# Patient Record
Sex: Male | Born: 1950
Health system: Southern US, Community
[De-identification: ages and names within clinical notes are randomized; demographics above are authoritative.]

## PROBLEM LIST (undated history)

## (undated) DIAGNOSIS — IMO0002 Reserved for concepts with insufficient information to code with codable children: Secondary | ICD-10-CM

## (undated) DIAGNOSIS — D126 Benign neoplasm of colon, unspecified: Secondary | ICD-10-CM

## (undated) DIAGNOSIS — T7840XA Allergy, unspecified, initial encounter: Secondary | ICD-10-CM

## (undated) DIAGNOSIS — R51 Headache: Secondary | ICD-10-CM

## (undated) DIAGNOSIS — K449 Diaphragmatic hernia without obstruction or gangrene: Secondary | ICD-10-CM

## (undated) DIAGNOSIS — Z8546 Personal history of malignant neoplasm of prostate: Secondary | ICD-10-CM

## (undated) DIAGNOSIS — U071 COVID-19: Secondary | ICD-10-CM

## (undated) DIAGNOSIS — I10 Essential (primary) hypertension: Secondary | ICD-10-CM

## (undated) DIAGNOSIS — J45909 Unspecified asthma, uncomplicated: Secondary | ICD-10-CM

## (undated) DIAGNOSIS — J189 Pneumonia, unspecified organism: Secondary | ICD-10-CM

## (undated) DIAGNOSIS — C801 Malignant (primary) neoplasm, unspecified: Secondary | ICD-10-CM

## (undated) DIAGNOSIS — R740 Nonspecific elevation of levels of transaminase and lactic acid dehydrogenase [LDH]: Secondary | ICD-10-CM

## (undated) DIAGNOSIS — K635 Polyp of colon: Secondary | ICD-10-CM

## (undated) DIAGNOSIS — E785 Hyperlipidemia, unspecified: Secondary | ICD-10-CM

## (undated) DIAGNOSIS — Z77090 Contact with and (suspected) exposure to asbestos: Secondary | ICD-10-CM

## (undated) DIAGNOSIS — K219 Gastro-esophageal reflux disease without esophagitis: Secondary | ICD-10-CM

## (undated) DIAGNOSIS — F411 Generalized anxiety disorder: Secondary | ICD-10-CM

## (undated) DIAGNOSIS — K3184 Gastroparesis: Secondary | ICD-10-CM

## (undated) HISTORY — DX: Personal history of malignant neoplasm of prostate: Z85.46

## (undated) HISTORY — DX: Diaphragmatic hernia without obstruction or gangrene: K44.9

## (undated) HISTORY — DX: Benign neoplasm of colon, unspecified: D12.6

## (undated) HISTORY — DX: Unspecified asthma, uncomplicated: J45.909

## (undated) HISTORY — DX: Contact with and (suspected) exposure to asbestos: Z77.090

## (undated) HISTORY — DX: Polyp of colon: K63.5

## (undated) HISTORY — PX: ROTATOR CUFF REPAIR: SHX139

## (undated) HISTORY — DX: COVID-19: U07.1

## (undated) HISTORY — DX: Allergy, unspecified, initial encounter: T78.40XA

## (undated) HISTORY — DX: Nonspecific elevation of levels of transaminase and lactic acid dehydrogenase (ldh): R74.0

## (undated) HISTORY — PX: COLONOSCOPY: SHX174

## (undated) HISTORY — DX: Gastroparesis: K31.84

## (undated) HISTORY — PX: APPENDECTOMY: SHX54

## (undated) HISTORY — DX: Generalized anxiety disorder: F41.1

## (undated) HISTORY — DX: Essential (primary) hypertension: I10

## (undated) HISTORY — DX: Headache: R51

## (undated) HISTORY — DX: Gastro-esophageal reflux disease without esophagitis: K21.9

## (undated) HISTORY — DX: Reserved for concepts with insufficient information to code with codable children: IMO0002

## (undated) HISTORY — DX: Hyperlipidemia, unspecified: E78.5

---

## 2001-02-15 ENCOUNTER — Encounter: Payer: Self-pay | Admitting: Internal Medicine

## 2002-02-28 LAB — HM COLONOSCOPY: HM Colonoscopy: NORMAL

## 2002-06-29 LAB — HM SIGMOIDOSCOPY

## 2002-07-11 ENCOUNTER — Encounter: Payer: Self-pay | Admitting: Internal Medicine

## 2004-06-17 ENCOUNTER — Ambulatory Visit: Payer: Self-pay | Admitting: Internal Medicine

## 2004-12-09 ENCOUNTER — Ambulatory Visit: Payer: Self-pay | Admitting: Internal Medicine

## 2004-12-17 ENCOUNTER — Ambulatory Visit: Payer: Self-pay | Admitting: Internal Medicine

## 2005-01-13 ENCOUNTER — Ambulatory Visit: Payer: Self-pay | Admitting: Internal Medicine

## 2005-01-19 ENCOUNTER — Ambulatory Visit: Payer: Self-pay | Admitting: Internal Medicine

## 2006-01-11 ENCOUNTER — Ambulatory Visit: Payer: Self-pay | Admitting: Internal Medicine

## 2006-02-28 DIAGNOSIS — C801 Malignant (primary) neoplasm, unspecified: Secondary | ICD-10-CM

## 2006-02-28 HISTORY — DX: Malignant (primary) neoplasm, unspecified: C80.1

## 2006-03-21 ENCOUNTER — Ambulatory Visit: Payer: Self-pay | Admitting: Internal Medicine

## 2006-03-21 LAB — CONVERTED CEMR LAB
ALT: 45 units/L — ABNORMAL HIGH (ref 0–40)
AST: 27 units/L (ref 0–37)
Albumin: 3.7 g/dL (ref 3.5–5.2)
Alkaline Phosphatase: 69 units/L (ref 39–117)
BUN: 11 mg/dL (ref 6–23)
Basophils Absolute: 0 10*3/uL (ref 0.0–0.1)
Basophils Relative: 0.9 % (ref 0.0–1.0)
CO2: 34 meq/L — ABNORMAL HIGH (ref 19–32)
Calcium: 9.5 mg/dL (ref 8.4–10.5)
Chloride: 101 meq/L (ref 96–112)
Cholesterol: 205 mg/dL (ref 0–200)
Creatinine, Ser: 1 mg/dL (ref 0.4–1.5)
Direct LDL: 106.8 mg/dL
Eosinophils Relative: 1.9 % (ref 0.0–5.0)
GFR calc Af Amer: 100 mL/min
GFR calc non Af Amer: 83 mL/min
Glucose, Bld: 87 mg/dL (ref 70–99)
HCT: 46.8 % (ref 39.0–52.0)
HDL: 42.7 mg/dL (ref >39.0)
Hemoglobin: 16.5 g/dL (ref 13.0–17.0)
Lymphocytes Relative: 32.2 % (ref 12.0–46.0)
MCHC: 35.2 g/dL (ref 30.0–36.0)
MCV: 92.7 fL (ref 78.0–100.0)
Monocytes Absolute: 0.6 10*3/uL (ref 0.2–0.7)
Monocytes Relative: 14.5 % — ABNORMAL HIGH (ref 3.0–11.0)
Neutro Abs: 2.3 10*3/uL (ref 1.4–7.7)
Neutrophils Relative %: 50.5 % (ref 43.0–77.0)
PSA: 6.26 ng/mL — ABNORMAL HIGH (ref 0.10–4.00)
Platelets: 291 10*3/uL (ref 150–400)
Potassium: 4.1 meq/L (ref 3.5–5.1)
RBC: 5.05 M/uL (ref 4.22–5.81)
RDW: 12 % (ref 11.5–14.6)
Sodium: 143 meq/L (ref 135–145)
TSH: 0.79 microintl units/mL (ref 0.35–5.50)
Total Bilirubin: 1.1 mg/dL (ref 0.3–1.2)
Total CHOL/HDL Ratio: 4.8
Total Protein: 6.8 g/dL (ref 6.0–8.3)
Triglycerides: 261 mg/dL (ref 0–149)
VLDL: 52 mg/dL — ABNORMAL HIGH (ref 0–40)
WBC: 4.4 10*3/uL — ABNORMAL LOW (ref 4.5–10.5)

## 2006-03-28 ENCOUNTER — Ambulatory Visit: Payer: Self-pay | Admitting: Internal Medicine

## 2006-04-18 ENCOUNTER — Ambulatory Visit: Payer: Self-pay | Admitting: Internal Medicine

## 2006-04-18 LAB — CONVERTED CEMR LAB
PSA, Free Pct: 12 — ABNORMAL LOW (ref >25)
PSA, Free: 0.6 ng/mL
PSA: 5.19 ng/mL — ABNORMAL HIGH (ref 0.10–4.00)

## 2006-07-10 ENCOUNTER — Inpatient Hospital Stay (HOSPITAL_COMMUNITY): Admission: RE | Admit: 2006-07-10 | Discharge: 2006-07-12 | Payer: Self-pay | Admitting: Urology

## 2006-07-10 ENCOUNTER — Encounter (INDEPENDENT_AMBULATORY_CARE_PROVIDER_SITE_OTHER): Payer: Self-pay | Admitting: Specialist

## 2006-07-10 HISTORY — PX: PROSTATECTOMY: SHX69

## 2007-01-09 ENCOUNTER — Encounter: Payer: Self-pay | Admitting: Internal Medicine

## 2007-03-07 ENCOUNTER — Ambulatory Visit: Payer: Self-pay | Admitting: Internal Medicine

## 2007-03-07 DIAGNOSIS — Z8546 Personal history of malignant neoplasm of prostate: Secondary | ICD-10-CM | POA: Insufficient documentation

## 2007-03-07 DIAGNOSIS — G43909 Migraine, unspecified, not intractable, without status migrainosus: Secondary | ICD-10-CM

## 2007-03-07 DIAGNOSIS — I10 Essential (primary) hypertension: Secondary | ICD-10-CM

## 2007-03-07 DIAGNOSIS — R51 Headache: Secondary | ICD-10-CM

## 2007-03-07 HISTORY — DX: Headache: R51

## 2007-03-07 HISTORY — DX: Essential (primary) hypertension: I10

## 2007-03-07 HISTORY — DX: Personal history of malignant neoplasm of prostate: Z85.46

## 2007-03-12 DIAGNOSIS — J45909 Unspecified asthma, uncomplicated: Secondary | ICD-10-CM

## 2007-03-12 HISTORY — DX: Unspecified asthma, uncomplicated: J45.909

## 2007-03-20 ENCOUNTER — Encounter: Payer: Self-pay | Admitting: Internal Medicine

## 2007-04-05 ENCOUNTER — Telehealth: Payer: Self-pay | Admitting: Internal Medicine

## 2007-04-17 ENCOUNTER — Encounter: Payer: Self-pay | Admitting: Internal Medicine

## 2007-06-01 ENCOUNTER — Ambulatory Visit: Payer: Self-pay | Admitting: Internal Medicine

## 2007-06-01 LAB — CONVERTED CEMR LAB
Basophils Absolute: 0 10*3/uL (ref 0.0–0.1)
Bilirubin, Direct: 0.2 mg/dL (ref 0.0–0.3)
Calcium: 9.3 mg/dL (ref 8.4–10.5)
Creatinine, Ser: 0.9 mg/dL (ref 0.4–1.5)
Eosinophils Relative: 2.2 % (ref 0.0–5.0)
GFR calc Af Amer: 113 mL/min
Glucose, Urine, Semiquant: NEGATIVE
HCT: 47 % (ref 39.0–52.0)
Ketones, urine, test strip: NEGATIVE
MCHC: 33.3 g/dL (ref 30.0–36.0)
Monocytes Absolute: 0.7 10*3/uL (ref 0.1–1.0)
Monocytes Relative: 14.5 % — ABNORMAL HIGH (ref 3.0–12.0)
Neutro Abs: 2.6 10*3/uL (ref 1.4–7.7)
Nitrite: NEGATIVE
Potassium: 3.9 meq/L (ref 3.5–5.1)
RBC: 4.9 M/uL (ref 4.22–5.81)
RDW: 11.6 % (ref 11.5–14.6)
TSH: 1.05 microintl units/mL (ref 0.35–5.50)
Total Bilirubin: 1.1 mg/dL (ref 0.3–1.2)
Total CHOL/HDL Ratio: 5.4
Total Protein: 6.3 g/dL (ref 6.0–8.3)
Triglycerides: 231 mg/dL (ref 0–149)
Urobilinogen, UA: 0.2
WBC Urine, dipstick: NEGATIVE
WBC: 4.9 10*3/uL (ref 4.5–10.5)

## 2007-06-11 ENCOUNTER — Ambulatory Visit: Payer: Self-pay | Admitting: Internal Medicine

## 2007-06-11 DIAGNOSIS — E785 Hyperlipidemia, unspecified: Secondary | ICD-10-CM

## 2007-06-11 HISTORY — DX: Hyperlipidemia, unspecified: E78.5

## 2007-06-12 ENCOUNTER — Telehealth (INDEPENDENT_AMBULATORY_CARE_PROVIDER_SITE_OTHER): Payer: Self-pay | Admitting: *Deleted

## 2007-06-27 ENCOUNTER — Emergency Department (HOSPITAL_COMMUNITY): Admission: EM | Admit: 2007-06-27 | Discharge: 2007-06-27 | Payer: Self-pay | Admitting: Emergency Medicine

## 2007-07-17 ENCOUNTER — Encounter: Payer: Self-pay | Admitting: Internal Medicine

## 2007-08-08 ENCOUNTER — Encounter: Payer: Self-pay | Admitting: Internal Medicine

## 2007-09-17 ENCOUNTER — Encounter: Admission: RE | Admit: 2007-09-17 | Discharge: 2007-09-17 | Payer: Self-pay | Admitting: Orthopedic Surgery

## 2007-10-04 ENCOUNTER — Ambulatory Visit: Payer: Self-pay | Admitting: Internal Medicine

## 2007-10-04 LAB — CONVERTED CEMR LAB
Alkaline Phosphatase: 60 units/L (ref 39–117)
Bilirubin, Direct: 0.2 mg/dL (ref 0.0–0.3)
Cholesterol: 214 mg/dL (ref 0–200)
HDL: 34.9 mg/dL — ABNORMAL LOW (ref >39.0)
Total Bilirubin: 1.3 mg/dL — ABNORMAL HIGH (ref 0.3–1.2)
VLDL: 40 mg/dL (ref 0–40)

## 2007-10-15 ENCOUNTER — Ambulatory Visit: Payer: Self-pay | Admitting: Internal Medicine

## 2007-12-12 ENCOUNTER — Ambulatory Visit: Payer: Self-pay | Admitting: Internal Medicine

## 2008-01-04 ENCOUNTER — Encounter: Payer: Self-pay | Admitting: Internal Medicine

## 2008-01-08 ENCOUNTER — Ambulatory Visit: Payer: Self-pay | Admitting: Internal Medicine

## 2008-01-08 LAB — CONVERTED CEMR LAB
AST: 31 units/L (ref 0–37)
Alkaline Phosphatase: 66 units/L (ref 39–117)
Bilirubin, Direct: 0.1 mg/dL (ref 0.0–0.3)
Total Bilirubin: 0.9 mg/dL (ref 0.3–1.2)
Total CHOL/HDL Ratio: 5
VLDL: 47 mg/dL — ABNORMAL HIGH (ref 0–40)

## 2008-01-22 ENCOUNTER — Ambulatory Visit: Payer: Self-pay | Admitting: Internal Medicine

## 2008-03-10 ENCOUNTER — Ambulatory Visit: Payer: Self-pay | Admitting: Internal Medicine

## 2008-03-14 ENCOUNTER — Telehealth: Payer: Self-pay | Admitting: Internal Medicine

## 2008-03-25 ENCOUNTER — Encounter: Payer: Self-pay | Admitting: Internal Medicine

## 2008-06-04 ENCOUNTER — Ambulatory Visit: Payer: Self-pay | Admitting: Internal Medicine

## 2008-06-04 LAB — CONVERTED CEMR LAB
Albumin: 3.6 g/dL (ref 3.5–5.2)
Bilirubin, Direct: 0.1 mg/dL (ref 0.0–0.3)
Eosinophils Absolute: 0.1 10*3/uL (ref 0.0–0.7)
GFR calc non Af Amer: 111.97 mL/min (ref >60)
Glucose, Bld: 91 mg/dL (ref 70–99)
Glucose, Urine, Semiquant: NEGATIVE
HCT: 44 % (ref 39.0–52.0)
HDL: 40 mg/dL (ref >39.00)
Lymphocytes Relative: 25.9 % (ref 12.0–46.0)
Lymphs Abs: 1.3 10*3/uL (ref 0.7–4.0)
MCV: 95.1 fL (ref 78.0–100.0)
Monocytes Absolute: 0.8 10*3/uL (ref 0.1–1.0)
Neutro Abs: 3 10*3/uL (ref 1.4–7.7)
Neutrophils Relative %: 58 % (ref 43.0–77.0)
Platelets: 246 10*3/uL (ref 150.0–400.0)
Potassium: 3.1 meq/L — ABNORMAL LOW (ref 3.5–5.1)
Sodium: 141 meq/L (ref 135–145)
Specific Gravity, Urine: 1.025
Total Bilirubin: 1.4 mg/dL — ABNORMAL HIGH (ref 0.3–1.2)
Total Protein: 7 g/dL (ref 6.0–8.3)
Urobilinogen, UA: 1
WBC Urine, dipstick: NEGATIVE

## 2008-06-17 ENCOUNTER — Ambulatory Visit: Payer: Self-pay | Admitting: Internal Medicine

## 2008-06-17 LAB — HM COLONOSCOPY

## 2008-07-04 ENCOUNTER — Encounter: Payer: Self-pay | Admitting: Internal Medicine

## 2008-09-23 ENCOUNTER — Telehealth (INDEPENDENT_AMBULATORY_CARE_PROVIDER_SITE_OTHER): Payer: Self-pay | Admitting: *Deleted

## 2008-11-20 ENCOUNTER — Telehealth: Payer: Self-pay | Admitting: *Deleted

## 2008-12-16 ENCOUNTER — Ambulatory Visit: Payer: Self-pay | Admitting: Internal Medicine

## 2008-12-16 ENCOUNTER — Telehealth: Payer: Self-pay | Admitting: Internal Medicine

## 2008-12-24 LAB — CONVERTED CEMR LAB
ALT: 80 units/L — ABNORMAL HIGH (ref 0–53)
AST: 40 units/L — ABNORMAL HIGH (ref 0–37)
Alkaline Phosphatase: 84 units/L (ref 39–117)
BUN: 13 mg/dL (ref 6–23)
Bilirubin, Direct: 0.1 mg/dL (ref 0.0–0.3)
Calcium: 9.5 mg/dL (ref 8.4–10.5)
Creatinine, Ser: 0.9 mg/dL (ref 0.4–1.5)
GFR calc non Af Amer: 111.75 mL/min (ref >60)
HDL: 47 mg/dL (ref >39.00)
LDL Cholesterol: 91 mg/dL (ref 0–99)
TSH: 1.46 microintl units/mL (ref 0.35–5.50)
Total Bilirubin: 1 mg/dL (ref 0.3–1.2)
Total CHOL/HDL Ratio: 4
Total Protein: 7.2 g/dL (ref 6.0–8.3)
VLDL: 33 mg/dL (ref 0.0–40.0)

## 2009-01-02 ENCOUNTER — Encounter (INDEPENDENT_AMBULATORY_CARE_PROVIDER_SITE_OTHER): Payer: Self-pay | Admitting: *Deleted

## 2009-01-05 ENCOUNTER — Ambulatory Visit: Payer: Self-pay | Admitting: Family Medicine

## 2009-01-21 ENCOUNTER — Ambulatory Visit: Payer: Self-pay | Admitting: Internal Medicine

## 2009-02-06 ENCOUNTER — Telehealth: Payer: Self-pay | Admitting: Internal Medicine

## 2009-02-18 ENCOUNTER — Ambulatory Visit: Payer: Self-pay | Admitting: Internal Medicine

## 2009-03-24 ENCOUNTER — Ambulatory Visit: Payer: Self-pay | Admitting: Internal Medicine

## 2009-03-24 LAB — CONVERTED CEMR LAB
Alkaline Phosphatase: 80 units/L (ref 39–117)
Bilirubin, Direct: 0.1 mg/dL (ref 0.0–0.3)
Direct LDL: 60.1 mg/dL
Hep B S Ab: NEGATIVE
Total Bilirubin: 1 mg/dL (ref 0.3–1.2)

## 2009-03-31 ENCOUNTER — Ambulatory Visit: Payer: Self-pay | Admitting: Internal Medicine

## 2009-04-01 ENCOUNTER — Encounter: Payer: Self-pay | Admitting: Internal Medicine

## 2009-04-01 ENCOUNTER — Telehealth: Payer: Self-pay | Admitting: Internal Medicine

## 2009-04-02 ENCOUNTER — Encounter: Payer: Self-pay | Admitting: Internal Medicine

## 2009-04-02 ENCOUNTER — Ambulatory Visit: Payer: Self-pay | Admitting: Gastroenterology

## 2009-04-02 DIAGNOSIS — K219 Gastro-esophageal reflux disease without esophagitis: Secondary | ICD-10-CM

## 2009-04-02 HISTORY — DX: Gastro-esophageal reflux disease without esophagitis: K21.9

## 2009-04-06 ENCOUNTER — Ambulatory Visit (HOSPITAL_COMMUNITY): Admission: RE | Admit: 2009-04-06 | Discharge: 2009-04-06 | Payer: Self-pay | Admitting: Gastroenterology

## 2009-04-07 ENCOUNTER — Ambulatory Visit: Payer: Self-pay | Admitting: Internal Medicine

## 2009-04-07 LAB — CONVERTED CEMR LAB
Albumin: 3.6 g/dL (ref 3.5–5.2)
BUN: 10 mg/dL (ref 6–23)
Basophils Absolute: 0 10*3/uL (ref 0.0–0.1)
Basophils Relative: 0 % (ref 0.0–3.0)
CO2: 32 meq/L (ref 19–32)
Creatinine, Ser: 1.1 mg/dL (ref 0.4–1.5)
Eosinophils Absolute: 0 10*3/uL (ref 0.0–0.7)
Eosinophils Relative: 0.1 % (ref 0.0–5.0)
GFR calc non Af Amer: 88.56 mL/min (ref >60)
HCT: 46.6 % (ref 39.0–52.0)
Lymphocytes Relative: 4.7 % — ABNORMAL LOW (ref 12.0–46.0)
Lymphs Abs: 0.4 10*3/uL — ABNORMAL LOW (ref 0.7–4.0)
MCV: 93.9 fL (ref 78.0–100.0)
Monocytes Relative: 2.8 % — ABNORMAL LOW (ref 3.0–12.0)
Neutro Abs: 8.3 10*3/uL — ABNORMAL HIGH (ref 1.4–7.7)
WBC: 9 10*3/uL (ref 4.5–10.5)

## 2009-04-08 ENCOUNTER — Telehealth: Payer: Self-pay | Admitting: Internal Medicine

## 2009-04-08 ENCOUNTER — Telehealth (INDEPENDENT_AMBULATORY_CARE_PROVIDER_SITE_OTHER): Payer: Self-pay | Admitting: *Deleted

## 2009-04-08 ENCOUNTER — Encounter: Payer: Self-pay | Admitting: Internal Medicine

## 2009-04-10 ENCOUNTER — Ambulatory Visit: Payer: Self-pay | Admitting: Internal Medicine

## 2009-04-10 ENCOUNTER — Encounter: Payer: Self-pay | Admitting: Internal Medicine

## 2009-04-14 ENCOUNTER — Encounter: Payer: Self-pay | Admitting: Internal Medicine

## 2009-04-14 ENCOUNTER — Telehealth: Payer: Self-pay | Admitting: Internal Medicine

## 2009-04-24 ENCOUNTER — Ambulatory Visit (HOSPITAL_COMMUNITY): Admission: RE | Admit: 2009-04-24 | Discharge: 2009-04-24 | Payer: Self-pay | Admitting: Internal Medicine

## 2009-04-24 ENCOUNTER — Telehealth: Payer: Self-pay | Admitting: Internal Medicine

## 2009-04-28 DIAGNOSIS — R7401 Elevation of levels of liver transaminase levels: Secondary | ICD-10-CM

## 2009-04-28 DIAGNOSIS — K449 Diaphragmatic hernia without obstruction or gangrene: Secondary | ICD-10-CM

## 2009-04-28 DIAGNOSIS — R740 Nonspecific elevation of levels of transaminase and lactic acid dehydrogenase [LDH]: Secondary | ICD-10-CM

## 2009-04-28 HISTORY — DX: Elevation of levels of liver transaminase levels: R74.01

## 2009-04-28 HISTORY — DX: Diaphragmatic hernia without obstruction or gangrene: K44.9

## 2009-05-01 ENCOUNTER — Ambulatory Visit: Payer: Self-pay | Admitting: Internal Medicine

## 2009-05-01 DIAGNOSIS — K3184 Gastroparesis: Secondary | ICD-10-CM

## 2009-05-01 HISTORY — DX: Gastroparesis: K31.84

## 2009-07-01 ENCOUNTER — Ambulatory Visit: Payer: Self-pay | Admitting: Internal Medicine

## 2009-07-01 LAB — CONVERTED CEMR LAB
ALT: 39 units/L (ref 0–53)
AST: 28 units/L (ref 0–37)
Albumin: 3.9 g/dL (ref 3.5–5.2)
Alkaline Phosphatase: 71 units/L (ref 39–117)
BUN: 10 mg/dL (ref 6–23)
Basophils Absolute: 0 10*3/uL (ref 0.0–0.1)
Blood in Urine, dipstick: NEGATIVE
CO2: 33 meq/L — ABNORMAL HIGH (ref 19–32)
Creatinine, Ser: 0.9 mg/dL (ref 0.4–1.5)
Eosinophils Absolute: 0.1 10*3/uL (ref 0.0–0.7)
Glucose, Bld: 83 mg/dL (ref 70–99)
HCT: 45.8 % (ref 39.0–52.0)
HDL: 39.3 mg/dL (ref >39.00)
Lymphocytes Relative: 34.8 % (ref 12.0–46.0)
MCHC: 34.4 g/dL (ref 30.0–36.0)
MCV: 94 fL (ref 78.0–100.0)
Monocytes Relative: 15.4 % — ABNORMAL HIGH (ref 3.0–12.0)
Neutrophils Relative %: 47 % (ref 43.0–77.0)
Nitrite: NEGATIVE
Platelets: 280 10*3/uL (ref 150.0–400.0)
RDW: 13.1 % (ref 11.5–14.6)
Sodium: 145 meq/L (ref 135–145)
Specific Gravity, Urine: 1.02
TSH: 0.78 microintl units/mL (ref 0.35–5.50)
Total Bilirubin: 0.7 mg/dL (ref 0.3–1.2)
Total CHOL/HDL Ratio: 4
Urobilinogen, UA: 0.2
WBC: 4.2 10*3/uL — ABNORMAL LOW (ref 4.5–10.5)
pH: 6

## 2009-07-02 ENCOUNTER — Encounter: Payer: Self-pay | Admitting: Internal Medicine

## 2009-07-07 ENCOUNTER — Ambulatory Visit: Payer: Self-pay | Admitting: Internal Medicine

## 2009-07-08 ENCOUNTER — Encounter: Admission: RE | Admit: 2009-07-08 | Discharge: 2009-08-10 | Payer: Self-pay | Admitting: Internal Medicine

## 2009-07-08 ENCOUNTER — Encounter: Payer: Self-pay | Admitting: Internal Medicine

## 2009-07-30 ENCOUNTER — Ambulatory Visit: Payer: Self-pay | Admitting: Internal Medicine

## 2009-08-03 ENCOUNTER — Telehealth: Payer: Self-pay | Admitting: Internal Medicine

## 2009-08-18 ENCOUNTER — Ambulatory Visit (HOSPITAL_COMMUNITY): Admission: RE | Admit: 2009-08-18 | Discharge: 2009-08-18 | Payer: Self-pay | Admitting: Internal Medicine

## 2009-09-01 ENCOUNTER — Ambulatory Visit: Payer: Self-pay | Admitting: Internal Medicine

## 2009-09-01 DIAGNOSIS — F411 Generalized anxiety disorder: Secondary | ICD-10-CM

## 2009-09-01 HISTORY — DX: Generalized anxiety disorder: F41.1

## 2009-09-03 ENCOUNTER — Telehealth: Payer: Self-pay | Admitting: Internal Medicine

## 2009-10-07 ENCOUNTER — Ambulatory Visit: Payer: Self-pay | Admitting: Internal Medicine

## 2009-11-30 ENCOUNTER — Ambulatory Visit: Payer: Self-pay | Admitting: Internal Medicine

## 2009-12-07 ENCOUNTER — Telehealth: Payer: Self-pay | Admitting: Internal Medicine

## 2009-12-24 ENCOUNTER — Telehealth: Payer: Self-pay | Admitting: Internal Medicine

## 2010-01-08 ENCOUNTER — Encounter: Payer: Self-pay | Admitting: Internal Medicine

## 2010-02-05 ENCOUNTER — Telehealth: Payer: Self-pay | Admitting: Internal Medicine

## 2010-03-30 NOTE — Assessment & Plan Note (Signed)
Summary: F/U FROM ENDO, CT & GES         Mark [name]   History of Present Illness Visit Type: Follow-up Visit Primary GI MD: Lina Sar MD Primary Provider: Birdie Sons, MD Requesting Provider: n/a Chief Complaint: F/u from ENDO, CT, and GES..Pt states that he is doing better but still having a funny feeling in his stomach  History of Present Illness:   This is a 60 year old African American male with gastroesophageal reflux and newly diagnosed gastroparesis. His gastric emptying scan showed 75% retention at one hour and 36% retention at 2 hours. He had mild gastritis and distal esophagitis with a small hiatal hernia. He feels better on Reglan 5 mg 3 times a day. A CT scan of the abdomen and pelvis in February 2011 showed him to be status post prostatectomy but was an otherwise normal exam. His last upper endoscopy was on 04/07/09.   GI Review of Systems      Denies abdominal pain, acid reflux, belching, bloating, chest pain, dysphagia with liquids, dysphagia with solids, heartburn, loss of appetite, nausea, vomiting, vomiting blood, weight loss, and  weight gain.        Denies anal fissure, black tarry stools, change in bowel habit, constipation, diarrhea, diverticulosis, fecal incontinence, heme positive stool, hemorrhoids, irritable bowel syndrome, jaundice, light color stool, liver problems, rectal bleeding, and  rectal pain.    Current Medications (verified): 1)  Fexofenadine Hcl 180 Mg Tabs (Fexofenadine Hcl) .... Once Daily 2)  Fluticasone Propionate 50 Mcg/act  Susp (Fluticasone Propionate) .... One Puff As Directed Once Daily 3)  Propranolol-Hctz 40-25 Mg Tabs (Propranolol-Hctz) .... Take 1 Tablet By Mouth Twice A Day 4)  Detrol La 4 Mg  Xr24h-Cap (Tolterodine Tartrate) .... Take 1 Tablet By Mouth Once A Day 5)  Simvastatin 40 Mg Tabs (Simvastatin) .... Take One Tablet At Bedtime 6)  Proair Hfa 108 (90 Base) Mcg/act Aers (Albuterol Sulfate) .... 2 Inhalations Qid As Directed 7)   Butalbital-Apap-Caffeine 50-325-40 Mg Caps (Butalbital-Apap-Caffeine) .... Take 1 Tablet By Mouth Once A Day As Needed 8)  Azelastine Hcl 137 Mcg/spray Soln (Azelastine Hcl) .... 2 Puffs Each Nostril Daily 9)  Nexium 40 Mg Cpdr (Esomeprazole Magnesium) .... Take 1 Tablet By Mouth Once A Day 10)  Ondansetron Hcl 4 Mg Tabs (Ondansetron Hcl) .[name] Kitchen.. 1 By Mouth Every 6 Hours As Needed For Nausea. 11)  Vicodin 5-500 Mg Tabs (Hydrocodone-Acetaminophen) .... Take One (1) Tablet By Mouth Every 6 Hrs As Needed For Pain 12)  Reglan 5 Mg Tabs (Metoclopramide Hcl) .... Take One By Mouth 30 Minutes Before Breakfast, Lunch & Dinner.  Allergies (verified): 1)  Darvon (Propoxyphene Hcl)  Past History:  Past Medical History: Reviewed history from 10/15/2007 and no changes required. Prostate cancer, hx of Headache Hypertension elevated LFT's--Hep B & C neg elevated PSA Asthma, as child Hyperlipidemia Allergic rhinitis  Past Surgical History: Reviewed history from 04/02/2009 and no changes required. Prostatectomy 5/12/8 Appendectomy ROTATOR CUFF REPAIR  9/09  Family History: Family History Hypertension-father, 2 brothers mother died age 60 pancreatic cancer No FH of Colon Cancer:  Social History: Reviewed history from 04/02/2009 and no changes required. Married 4 healthy children Occupation: [employer] Alcohol Use - no Illicit Drug Use - no  Review of Systems  The patient denies allergy/sinus, anemia, anxiety-new, arthritis/joint pain, back pain, blood in urine, breast changes/lumps, change in vision, confusion, cough, coughing up blood, depression-new, fainting, fatigue, fever, headaches-new, hearing problems, heart murmur, heart rhythm changes, itching, muscle pains/cramps, night  sweats, nosebleeds, shortness of breath, skin rash, sleeping problems, sore throat, swelling of feet/legs, swollen lymph glands, thirst - excessive, urination - excessive, urination changes/pain, urine leakage,  vision changes, and voice change.         Pertinent positive and negative review of systems were noted in the above HPI. All other ROS was otherwise negative.   Vital Signs:  Patient profile:   60 year old male Height:      69.5 inches Weight:      196 pounds BMI:     28.63 BSA:     2.06 Pulse rate:   72 / minute Pulse rhythm:   regular BP sitting:   128 / 86  (left arm) Cuff size:   regular  Vitals Entered By: Ok Anis CMA (May 01, 2009 8:59 AM)   Impression & Recommendations:  Problem # 1:  GASTROPARESIS (ICD-536.3) Patinet has delayed gastric emptying.  He is on Reglan 5 mg 3 times a day with only partial response. Patient denies any side effects of Reglan. We will increase his Reglan to 10 mg 2 times a day and have him to continue Nexium 40 mg twice a day. We have filled out his FML form.  Problem # 2:  WEIGHT LOSS, ABNORMAL (ICD-783.21) Patient is 196 pounds today which is the usual weight.  Problem # 3:  NAUSEA AND VOMITING (ICD-787.01) improved on Reglan.  Patient Instructions: 1)  Reglan 10 mg p.o. t.i.d. before meals. 2)  Nexium 40 mg p.o. b.i.d. 3)  Refill on Zofran. 4)  Gastroparesis diet. 5)  Booklet on gastroparesis. 6)  FML form filled out. 7)  Office visit 3 months. 8)  Copy sent to : Dr B.Swords 9)  The medication list was reviewed and reconciled.  All changed / newly prescribed medications were explained.  A complete medication list was provided to the patient / caregiver. Prescriptions: REGLAN 5 MG TABS (METOCLOPRAMIDE HCL) Take 2 tablets by mouth three times a day (before breakfast, lunch and dinner)  #270 x 3   Entered by:   Hortense Ramal CMA (AAMA)   Authorized by:   Hart Carwin MD   Signed by:   Hortense Ramal CMA (AAMA) on 05/01/2009   Method used:   Electronically to        [facility]* (mail-order)       [address] [place], [place]  [address]       Ph: [phone]       Fax: [phone]   RxID:    [id number] ONDANSETRON HCL 4 MG TABS (ONDANSETRON HCL) 1 by mouth every 6 hours as needed for nausea.  #20 x 1   Entered by:   Hortense Ramal CMA (AAMA)   Authorized by:   Hart Carwin MD   Signed by:   Hortense Ramal CMA (AAMA) on 05/01/2009   Method used:   [facility] to        [facility] [phone]* (retail)       [address]       [place], [place]  [address]       Ph: [phone] or [phone]       Fax: [phone]   RxID:   [id number] NEXIUM 40 MG CPDR (ESOMEPRAZOLE MAGNESIUM) Take 1 tablet by mouth two times a day  #180 x 3   Entered by:   Hortense Ramal CMA (AAMA)  Authorized by:   Hart Carwin MD   Signed by:   Hortense Ramal CMA (AAMA) on 05/01/2009   Method used:   [facility] to        [facility] [phone]* (retail)       [address]       [place], [place]  [address]       Ph: [phone] or [phone]       Fax: [phone]   RxID:   [id number]

## 2010-03-30 NOTE — Letter (Signed)
Summary: [facility]  [facility]   Imported By: Maryln Gottron 07/08/2009 13:19:07  _____________________________________________________________________  External Attachment:    Type:   Image     Comment:   External Document

## 2010-03-30 NOTE — Progress Notes (Signed)
Summary: CONDITION UPDATE  Phone Note Call from Patient Call back at Home Phone [phone] Call back at [phone]   Caller: Patient Call For: Dr. Juanda Chance Reason for Call: Talk to Nurse Summary of Call: pt says he talked to Dr. Juanda Chance last night and she told him to call today if he wasnt feeling any better... pt reports that he has diarrhea this morning and nausea Initial call taken by: Vallarie Mare,  April 08, 2009 10:15 AM  Follow-up for Phone Call        Pt. is scheduled for a CT on 04-10-09, he chose this date.  Pt. continues with nausea and loose stools. Denies blood, fever.  1) Immodium as needed for diarrhea. 2) Full liquids/BRAT diet x24 hours. Advance to soft,bland diet x2-4 days. Advanced as tolerated. 3) Use Zofran or Phenergan for nausea, whichever one works best for you. 4) If symptoms become worse call back immediately or go to ER.   Follow-up by: Laureen Ochs LPN,  April 08, 2009 10:25 AM  Additional Follow-up for Phone Call Additional follow up Details #1::        reviwed and agree. Additional Follow-up by: Hart Carwin MD,  April 08, 2009 12:52 PM

## 2010-03-30 NOTE — Letter (Signed)
Summary: EGD Instructions  [facility]  [address] [place], [place] [address]   Phone: [phone]  Fax: [phone]       RONNIE DOO    Aug 20, 1950    MRN: [id number]       Procedure Day /Date: 04-07-09     Arrival Time: 10:30 AM     Procedure Time: 11:30 AM     Location of Procedure:                    X    [facility] (4th Floor)  PREPARATION FOR ENDOSCOPY   On 04-07-09 THE DAY OF THE PROCEDURE:  1.   No solid foods, milk or milk products are allowed after midnight the night before your procedure.  2.   Do not drink anything colored red or purple.  Avoid juices with pulp.  No orange juice.  3.  You may drink clear liquids until  9:30 AM  which is 2 hours before your procedure.                                                                                                CLEAR LIQUIDS INCLUDE: Water Jello Ice Popsicles Tea (sugar ok, no milk/cream) Powdered fruit flavored drinks Coffee (sugar ok, no milk/cream) Gatorade Juice: apple, white grape, white cranberry  Lemonade Clear bullion, consomm, broth Carbonated beverages (any kind) Strained chicken noodle soup Hard Candy   MEDICATION INSTRUCTIONS  Unless otherwise instructed, you should take regular prescription medications with a small sip of water as early as possible the morning of your procedure.      OTHER INSTRUCTIONS  You will need a responsible adult at least 59 years of age to accompany you and drive you home.   This person must remain in the waiting room during your procedure.  Wear loose fitting clothing that is easily removed.  Leave jewelry and other valuables at home.  However, you may wish to bring a book to read or an iPod/MP3 player to listen to music as you wait for your procedure to start.  Remove all body piercing jewelry and leave at home.  Total time from sign-in until discharge is approximately 2-3 hours.  You should go home directly after your  procedure and rest.  You can resume normal activities the day after your procedure.  The day of your procedure you should not:   Drive   Make legal decisions   Operate machinery   Drink alcohol   Return to work  You will receive specific instructions about eating, activities and medications before you leave.    The above instructions have been reviewed and explained to me by   _______________________    I fully understand and can verbalize these instructions _____________________________ Date _________

## 2010-03-30 NOTE — Miscellaneous (Signed)
Summary: vicodin rx  Clinical Lists Changes  Medications: Added new medication of VICODIN 5-500 MG TABS (HYDROCODONE-ACETAMINOPHEN) Take one (1) tablet by mouth every 6 hrs as needed for pain - Signed Rx of VICODIN 5-500 MG TABS (HYDROCODONE-ACETAMINOPHEN) Take one (1) tablet by mouth every 6 hrs as needed for pain;  #15 x 0;  Signed;  Entered by: Oda Cogan;  Authorized by: Hart Carwin MD;  Method used: Print then Give to Patient    Prescriptions: VICODIN 5-500 MG TABS (HYDROCODONE-ACETAMINOPHEN) Take one (1) tablet by mouth every 6 hrs as needed for pain  #15 x 0   Entered by:   Oda Cogan   Authorized by:   Hart Carwin MD   Signed by:   Oda Cogan on 04/07/2009   Method used:   Print then Give to Patient   RxID:   [id number]

## 2010-03-30 NOTE — Progress Notes (Signed)
Summary: rx cocnern  Phone Note Call from Patient Call back at [phone]   Caller: Patient Call For: Dr. Juanda Chance Reason for Call: Talk to Nurse Summary of Call: pt says he needs a 90 days supply, not a 30 day supply... [facility]il[facility] [place] Initial call taken by: Vallarie Mare,  December 24, 2009 2:26 PM  Follow-up for Phone Call        Prescription sent. Follow-up by: Lamona Curl CMA Duncan Dull),  December 24, 2009 3:32 PM    Prescriptions: PAXIL 20 MG TABS (PAROXETINE HCL) Take 1 tablet by mouth once a day  #90 x 0   Entered by:   Lamona Curl CMA (AAMA)   Authorized by:   Hart Carwin MD   Signed by:   Lamona Curl CMA (AAMA) on 12/24/2009   Method used:   [facility] to        [facility] [phone]* (retail)       [address]       [place], [place]  [address]       Ph: [phone] or [phone]       Fax: [phone]   RxID:   [id number]

## 2010-03-30 NOTE — Assessment & Plan Note (Signed)
Summary: F/U FROM LAST OV, BEGINNING DOMPERIDONE AND HIDA SCAN        ...   History of Present Illness Visit Type: Follow-up Visit Primary GI MD: Lina Sar MD Primary Provider: Birdie Sons, MD Requesting Provider: n/a Chief Complaint: Patient still has some nausea, started domperidone, HIDA results History of Present Illness:   This is a 60 year old African American male with gastroesophageal reflux and newly diagnosed gastroparesis. His gastric emptying scan showed 75% retention at one hour and 36% retention at 2 hours. He had mild gastritis and distal esophagitis with a small hiatal hernia on endoscopy in February 2011. A CT scan of the abdomen and pelvis in February 2011 showed him to be status post prostatectomy, but was otherwise a normal exam. He takes Zofran on a daily basis. Patient denies any abdominal pain. An abdominal ultrasound in February was negative. At patient's last office visit, he was started on Paxil 10 mg daily and was scheduled for a HIDA scan. HIDA scan on 08/18/09 which showed the gallbladder contracting at 65.6%. 30% or greater is normal range. During CCK infusion the patient did report experiencing abdominal pain. There was demonstration of patency of the common bile duct and the cystic duct. There was no evidence of cholecystitis. Patient was asked to continue his Paxil and Nexium 40 mg two times a day. He comes today for a follow up after his HIDA scan. He complains of some continued nausea at this time. He has been started on domperidone 10 mg, 1 tablet by mouth three times a day before meals.he actually every just 2 tablets daily. He has felt much improved on Paxil and domperidone   GI Review of Systems    Reports nausea.      Denies abdominal pain, acid reflux, belching, bloating, chest pain, dysphagia with liquids, dysphagia with solids, heartburn, loss of appetite, vomiting, vomiting blood, weight loss, and  weight gain.        Denies anal fissure, black tarry  stools, change in bowel habit, constipation, diarrhea, diverticulosis, fecal incontinence, heme positive stool, hemorrhoids, irritable bowel syndrome, jaundice, light color stool, liver problems, rectal bleeding, and  rectal pain.    Current Medications (verified): 1)  Fexofenadine Hcl 180 Mg Tabs (Fexofenadine Hcl) .... Once Daily 2)  Fluticasone Propionate 50 Mcg/act  Susp (Fluticasone Propionate) .... One Puff As Directed Once Daily 3)  Propranolol-Hctz 40-25 Mg Tabs (Propranolol-Hctz) .... Take 1 Tablet By Mouth Twice A Day 4)  Detrol La 4 Mg  Xr24h-Cap (Tolterodine Tartrate) .... Take 1 Tablet By Mouth Once A Day 5)  Simvastatin 40 Mg Tabs (Simvastatin) .... Take One Tablet At Bedtime 6)  Proair Hfa 108 (90 Base) Mcg/act Aers (Albuterol Sulfate) .... 2 Inhalations Qid As Directed 7)  Butalbital-Apap-Caffeine 50-325-40 Mg Caps (Butalbital-Apap-Caffeine) .... Take 1 Tablet By Mouth Once A Day As Needed 8)  Azelastine Hcl 137 Mcg/spray Soln (Azelastine Hcl) .... 2 Puffs Each Nostril Daily 9)  Nexium 40 Mg Cpdr (Esomeprazole Magnesium) .... Take 1 Tablet By Mouth Two Times A Day 10)  Ondansetron Hcl 4 Mg Tabs (Ondansetron Hcl) .... Take One Tablet By Mouth Every Other Day As Needed 11)  Promethazine Hcl 25 Mg Tabs (Promethazine Hcl) .... Take One Tablet By Mouth Every Other Day As Needed Nausea 12)  Paxil 10 Mg Tabs (Paroxetine Hcl) .... Take 1 Tab By Mouth At Bedtime 13)  Domperidone 10mg  Tabs .... Take One By Mouth 30 Minutes Before Breakfast, Lunch and Dinner.  Allergies (verified): 1)  Darvon  Past History:  Past Medical History: Reviewed history from 10/15/2007 and no changes required. Prostate cancer, hx of Headache Hypertension elevated LFT's--Hep B & C neg elevated PSA Asthma, as child Hyperlipidemia Allergic rhinitis  Past Surgical History: Reviewed history from 04/02/2009 and no changes required. Prostatectomy 5/12/8 Appendectomy ROTATOR CUFF REPAIR  9/09  Family  History: Reviewed history from 05/01/2009 and no changes required. Family History Hypertension-father, 2 brothers mother died age 7 pancreatic cancer No FH of Colon Cancer:  Social History: Reviewed history from 04/02/2009 and no changes required. Married 4 healthy children Occupation: [employer] Alcohol Use - no Illicit Drug Use - no  Review of Systems       The patient complains of allergy/sinus and fatigue.  The patient denies anemia, anxiety-new, arthritis/joint pain, back pain, blood in urine, breast changes/lumps, change in vision, confusion, cough, coughing up blood, depression-new, fainting, fever, headaches-new, hearing problems, heart murmur, heart rhythm changes, itching, menstrual pain, muscle pains/cramps, night sweats, nosebleeds, pregnancy symptoms, shortness of breath, skin rash, sleeping problems, sore throat, swelling of feet/legs, swollen lymph glands, thirst - excessive , urination - excessive , urination changes/pain, urine leakage, vision changes, and voice change.         Pertinent positive and negative review of systems were noted in the above HPI. All other ROS was otherwise negative.   Vital Signs:  Patient profile:   60 year old male Height:      69.5 inches Weight:      199.50 pounds BMI:     29.14 Pulse rate:   68 / minute Pulse rhythm:   regular BP sitting:   140 / 84  (left arm) Cuff size:   regular  Vitals Entered By: June McMurray CMA Duncan Dull) (September 01, 2009 8:41 AM)   Impression & Recommendations:  Problem # 1:  GASTROPARESIS (ICD-536.3) slow gastric emptying as per recent  gastric emptying scan. Patient is somewhat improved on domperidone 10 mg prior to meals. He will continue on this  same regimen. He also has Zofran for  nausea. He has been able to reduce the Zofran to one every 2 days  Problem # 2:  HIATAL HERNIA (ICD-553.3) reflux treated with Nexium 40 mg twice a day  Problem # 3:  ANXIETY STATE, UNSPECIFIED (ICD-300.00) Patient is   very anxious. We will increase Paxil to 20 mg daily  Patient Instructions: 1)  Please increase your Paxil to 20 mg daily. We will send a new prescription to your pharmacy electronically. 2)  Continue your Zofran as directed. 3)  Continue taking your Domperidone. 4)  Copy sent to : Birdie Sons, MD 5)  The medication list was reviewed and reconciled.  All changed / newly prescribed medications were explained.  A complete medication list was provided to the patient / caregiver. Prescriptions: PAXIL 20 MG TABS (PAROXETINE HCL) Take 1 tablet by mouth once a day (PHARMACY-PLEASE D/C RX FOR PAXIL 10 MG!)  #30 x 3   Entered by:   Lamona Curl CMA (AAMA)   Authorized by:   Hart Carwin MD   Signed by:   Lamona Curl CMA (AAMA) on 09/01/2009   Method used:   [facility] to        [facility]. [phone]* (retail)       [address]       [place], [place]  [address]       Ph: [phone] or [phone]  Fax: [phone]   RxID:   [id number]

## 2010-03-30 NOTE — Progress Notes (Signed)
Summary: Triage  Phone Note Outgoing Call   Call placed by: Laureen Ochs LPN,  April 24, 2009 4:21 PM Summary of Call: Pt. states he thinks he took Reglan 30 years ago, doesn't think it worked. He will trial Reglan 5mg  AC and call back for any problems/questions. I will call pt., if new orders, after MD reviews.  Initial call taken by: Laureen Ochs LPN,  April 24, 2009 4:22 PM  Follow-up for Phone Call        He should try it. Follow-up by: Hart Carwin MD,  April 25, 2009 2:46 PM

## 2010-03-30 NOTE — Letter (Signed)
Summary: Patient Notice-Endo Biopsy Results  [facility]  [address] [place], [place] [address]   Phone: [phone]  Fax: [phone]        April 08, 2009 MRN: [id number]    Mark [name] [address] [place], [place]  [address]    Dear [name],  I am pleased to inform you that the biopsies taken during your recent endoscopic examination did not show any evidence of cancer upon pathologic examination.The tissue from the stomach showed gastritis,  Additional information/recommendations:  __No further action is needed at this time.  Please follow-up with      your primary care physician for your other healthcare needs.  _x_ Please call [phone] to schedule a return visit to review      your condition.  _x_ Continue with the treatment plan as outlined on the day of your      exam.     Please call us if you are having persistent problems or have questions about your condition that have not been fully answered at this time.  Sincerely,  Hart Carwin MD  This letter has been electronically signed by your physician.  Appended Document: Patient Notice-Endo Biopsy Results Letter mailed 2.10.11

## 2010-03-30 NOTE — Miscellaneous (Signed)
Summary: med change of directions  Clinical Lists Changes  Medications: Changed medication from ONDANSETRON HCL 4 MG TABS (ONDANSETRON HCL) 1 by mouth once daily as needed anxiety to ONDANSETRON HCL 4 MG TABS (ONDANSETRON HCL) 1 by mouth once daily as needed nausea

## 2010-03-30 NOTE — Assessment & Plan Note (Signed)
Summary: CHRONIC,WORSENING NAUSEA           (DR.BRODIE PT)        DEBORAH   History of Present Illness Visit Type: Initial Visit Primary GI MD: Lina Sar MD Primary Provider: Birdie Sons, MD Chief Complaint: Chronic nausea x 3months History of Present Illness:   60 YO MALE KNOWN TO DR. Juanda Chance FROM COLONOSCOPY DONE IN 2004 WHICH WAS NORMAL. HE IS REFERRED TODAY WITH C/O PERSISTENT NAUSEA X 4 MONTHS. HE REPORTS HX OF INTERMITTENT NAUSEA OVER THE PAST FEW YEARS, GENERALLY ABORTED WITH PHENERGAN AS NEEDED. NOW WITH ONSET OF SXS IN OCTOBER. HE ALSO WAS HAVING SOME BURNING IN HIS THROAT-WAS STARTED ON NEXIUM PER PRIMARY WHICH RESOLVED THE BURNING. HE SAYS HE FEELS NAUSEATED EVERY DAY-LIKE HE MAY VOMIT. APPETITE HAD BEEN DECREASED, LOST ABOUT 15 POUNDS, THEN GOT SCARED AND STARTED MAKING HIMSELF EAT AND HAS GAINED SOME BACK. DENIES DYSPHAGAI, ODYNOPHAGIA, NO ABDOMINAL PAIN. BMS NORMAL, NO MELENA. HE C/O EARLY SATIETY. HE ALSO TESTED + FOR H.PYLORI AND WAS TREATED LAST MONTH-NO IMPROVEMENT AND REGIMEN MADE HIM VERY NAUSEATED.   GI Review of Systems    Reports loss of appetite and  nausea.      Denies abdominal pain, acid reflux, belching, bloating, chest pain, dysphagia with liquids, dysphagia with solids, heartburn, vomiting, vomiting blood, weight loss, and  weight gain.        Denies anal fissure, black tarry stools, change in bowel habit, constipation, diarrhea, diverticulosis, fecal incontinence, heme positive stool, hemorrhoids, irritable bowel syndrome, jaundice, light color stool, liver problems, rectal bleeding, and  rectal pain. Preventive Screening-Counseling & Management      Drug Use:  no.     Current Medications (verified): 1)  Fexofenadine Hcl 180 Mg Tabs (Fexofenadine Hcl) .... Once Daily 2)  Fluticasone Propionate 50 Mcg/act  Susp (Fluticasone Propionate) .... One Puff As Directed Once Daily 3)  Propranolol-Hctz 40-25 Mg Tabs (Propranolol-Hctz) .... Take 1 Tablet By Mouth Twice  A Day 4)  Detrol La 4 Mg  Xr24h-Cap (Tolterodine Tartrate) .... Take 1 Tablet By Mouth Once A Day 5)  Simvastatin 40 Mg Tabs (Simvastatin) .... Take One Tablet At Bedtime 6)  Proair Hfa 108 (90 Base) Mcg/act Aers (Albuterol Sulfate) .... 2 Inhalations Qid As Directed 7)  Butalbital-Apap-Caffeine 50-325-40 Mg Caps (Butalbital-Apap-Caffeine) .... Take 1 Tablet By Mouth Once A Day As Needed 8)  Azelastine Hcl 137 Mcg/spray Soln (Azelastine Hcl) .... 2 Puffs Each Nostril Daily 9)  Nexium 40 Mg Cpdr (Esomeprazole Magnesium) .... Take 1 Tablet By Mouth Once A Day 10)  Ondansetron Hcl 4 Mg Tabs (Ondansetron Hcl) .[name] Kitchen.. 1 By Mouth Once Daily As Needed Nausea  Allergies (verified): 1)  Darvon (Propoxyphene Hcl)  Past History:  Past Medical History: Reviewed history from 10/15/2007 and no changes required. Prostate cancer, hx of Headache Hypertension elevated LFT's--Hep B & C neg elevated PSA Asthma, as child Hyperlipidemia Allergic rhinitis  Past Surgical History: Prostatectomy 5/12/8 Appendectomy ROTATOR CUFF REPAIR  9/09  Family History: Reviewed history from 03/12/2007 and no changes required. Family History Hypertension-father, 2 brothers mother died age 64 pancreatic cancer  Social History: Married 4 healthy children Occupation: [employer] Alcohol Use - no Illicit Drug Use - no Drug Use:  no  Review of Systems       The patient complains of allergy/sinus, back pain, cough, headaches-new, and thirst - excessive.  The patient denies anemia, anxiety-new, arthritis/joint pain, blood in urine, breast changes/lumps, change in vision, confusion, coughing up blood,  depression-new, fainting, fatigue, fever, hearing problems, heart murmur, heart rhythm changes, menstrual pain, muscle pains/cramps, night sweats, nosebleeds, pregnancy symptoms, shortness of breath, skin rash, sleeping problems, sore throat, swelling of feet/legs, swollen lymph glands, thirst - excessive , urination -  excessive , urination changes/pain, urine leakage, vision changes, and voice change.         ROS OTHERWISE AS IN HPI  Vital Signs:  Patient profile:   60 year old male Height:      69.5 inches Weight:      200.38 pounds BMI:     29.27 Pulse rate:   68 / minute Pulse rhythm:   regular BP sitting:   128 / 90  (left arm) Cuff size:   regular  Vitals Entered By: June McMurray CMA Duncan Dull) (April 02, 2009 3:13 PM)  Physical Exam  General:  Well developed, well nourished, no acute distress. Head:  Normocephalic and atraumatic. Eyes:  PERRLA, no icterus. Lungs:  Clear throughout to auscultation. Heart:  Regular rate and rhythm; no murmurs, rubs,  or bruits. Abdomen:  SOFT, NONDISTENDE, BS+ NO PALP MASS OR HSM Rectal:  NOT DONE Extremities:  No clubbing, cyanosis, edema or deformities noted. Neurologic:  Alert and  oriented x4;  grossly normal neurologically. Psych:  Alert and cooperative. Normal mood and affect.  Impression & Recommendations:  Problem # 1:  NAUSEA, CHRONIC (ICD-787.02) Assessment Unchanged 60 YO MALE WITH PERSISTENT NAUSEA X 4 MONTHS, ASSOCIATED WITH EARLY SATIETY AND HEARTBURN WHICH IS NOW CONTROLLED WITH NEXIUM. ETIOLOGY NOT CLEAR-R/O GASTRITIS/PUD, GASTROPARESIS, GB DISEASE, VS CENTRAL ETIOLOGY. LABS WITHIN PAST MONTH REVIEWED-UNREMARKABLE CONTINUE NEXIUM 40 MG DAILY CONTINUE ZOFRAN 4 MG TWICE DAILY WHICH IS BENEFICIAL SCHEDULE FOR UPPER ENDOSCOPY WITH DR. Marcille Buffy IN DETAIL WITH PT. SCHEDULE FOR UPPER ABDOMINAL US. IF BOTH OF ABOVE NEGATIVE WILL NEED GASTRIC EMPTYING SCAN. Orders: EGD (EGD)  Problem # 2:  HYPERLIPIDEMIA (ICD-272.4) Assessment: Comment Only  Problem # 3:  HYPERTENSION (ICD-401.9) Assessment: Comment Only  Problem # 4:  PROSTATE CANCER, HX OF (ICD-V10.46) Assessment: Comment Only S/P PROSTATECTOMY  Patient Instructions: 1)  We have scheduled the Ultrasound for mnon 04-06-09 at [facility].  Instructions provided. 2)   Endoscopy scheduled with Dr. Juanda Chance for 04-07-09.  3)  Endoscopy brochure provided 4)  Continue the Nexium once daily, 30 min  before breakfast. 5)  Continue the Zofran twice daily.  6)  Copy sent to : Birdie Sons, MD 7)  The medication list was reviewed and reconciled.  All changed / newly prescribed medications were explained.  A complete medication list was provided to the patient / caregiver.

## 2010-03-30 NOTE — Letter (Signed)
Summary: [facility]  [facility]   Imported By: Maryln Gottron 01/15/2010 11:29:56  _____________________________________________________________________  External Attachment:    Type:   Image     Comment:   External Document

## 2010-03-30 NOTE — Progress Notes (Signed)
Summary: CT ORDERED  Phone Note Outgoing Call   Call placed by: Laureen Ochs LPN,  April 08, 2009 9:52 AM Call placed to: Patient Summary of Call: Per Dr.Latressa Harries--Pt. needs a CT of Abd/Pelvis w/oral & IV contrast, for weight loss,n/v. Pt. is scheduled at [facility] CT on 04-10-09 at 10am. All instructions reviewed with pt. by phone. Pt. instructed to call back as needed.  Initial call taken by: Laureen Ochs LPN,  April 08, 2009 9:57 AM  New Problems: WEIGHT LOSS, ABNORMAL (ICD-783.21) NAUSEA AND VOMITING (ICD-787.01)   New Problems: WEIGHT LOSS, ABNORMAL (ICD-783.21) NAUSEA AND VOMITING (ICD-787.01)

## 2010-03-30 NOTE — Progress Notes (Signed)
Summary: Suella Broad for propranolol  Phone Note Call from Patient Call back at [phone]   Caller: vm Summary of Call: Propranolol [facility] Rx [facility] at [facility]  [place].   they have called/faxed it in a couple times.  Might have gotten sent to mail order. Out of med.  They gave 6 pills.       Initial call taken by: Rudy Jew, RN,  December 07, 2009 10:00 AM  Follow-up for Phone Call        refill for one year Follow-up by: Birdie Sons MD,  December 07, 2009 11:06 AM  Additional Follow-up for Phone Call Additional follow up Details #1::        Left message to check with [facility] and to call back to triage.  Raelene Bott Spell, RN  December 07, 2009 11:07 AM LM that Does not use [facility].  Use [phone] [facility] [place].  Call me please.   Rudy Jew, RN  December 07, 2009 12:07 PM        Additional Follow-up for Phone Call Additional follow up Details #2::    Called patient to say Rx sent to [facility] [place].   Follow-up by: Rudy Jew, RN,  December 07, 2009 12:11 PM

## 2010-03-30 NOTE — Procedures (Signed)
Summary: Upper Endoscopy  Patient: Mark [name] Note: All result statuses are Final unless otherwise noted.  Tests: (1) Upper Endoscopy (EGD)   EGD Upper Endoscopy       DONE     [facility]     [address]     [place], [place]  [address]           ENDOSCOPY PROCEDURE REPORT           PATIENT:  [name], [name]  MR#:  [id number]     BIRTHDATE:  February 03, 1951, 58 yrs. old  GENDER:  male           ENDOSCOPIST:  Hedwig Morton. Juanda Chance, MD     Referred by:  Birdie Sons, M.D.           PROCEDURE DATE:  04/07/2009     PROCEDURE:  EGD with biopsy     ASA CLASS:  Class II     INDICATIONS:  nausea nausea and vomitting this morning, fever     101.2F, no abd. pain     pt on Nexiem, Phenergan           MEDICATIONS:   Versed 6 mg, Fentanyl 50 mcg     TOPICAL ANESTHETIC:  Exactacain Spray           DESCRIPTION OF PROCEDURE:   After the risks benefits and     alternatives of the procedure were thoroughly explained, informed     consent was obtained.  The LB GIF-H180 [id number] endoscope was     introduced through the mouth and advanced to the second portion of     the duodenum, without limitations.  The instrument was slowly     withdrawn as the mucosa was fully examined.     <<PROCEDUREIMAGES>>           Mild gastritis was found. mild diffuse erythema With standard     forceps, a biopsy was obtained and sent to pathology (see image3     and image5).  Esophagitis was found in the distal esophagus (see     image1). mild distal esophagitis due to vomiting  A hiatal hernia     was found (see image7 and image6).  Otherwise the examination was     normal (see image5, image4, and image2).    Retroflexed views     revealed no abnormalities.    The scope was then withdrawn from     the patient and the procedure completed.           COMPLICATIONS:  None           ENDOSCOPIC IMPRESSION:     1) Mild gastritis     2) Esophagitis in the distal esophagus     3) Hiatal hernia     4)  Otherwise normal examination     s/p gastric biopsies     acute viral syndrome     RECOMMENDATIONS:     1) await biopsy results     Nexiem 40 mg po qd     Phenergan 25 mg po q 6 hrs prn nausea.     Tylenol prn fever     Vicodin 5/325, # 15, 1 po q 6 hrs prn, no refill           REPEAT EXAM:  In 0 year(s) for.           ______________________________     Hedwig Morton. Juanda Chance, MD  CC:           n.     eSIGNED:   Carmisha Larusso M. Nikita Surman at 04/07/2009 12:07 PM           Edythe Lynn, [id number]  Note: An exclamation mark (!) indicates a result that was not dispersed into the flowsheet. Document Creation Date: 04/07/2009 12:07 PM _______________________________________________________________________  (1) Order result status: Final Collection or observation date-time: 04/07/2009 11:56 Requested date-time:  Receipt date-time:  Reported date-time:  Referring Physician:   Ordering Physician: Lina Sar [phone]) Specimen Source:  Source: Launa Grill Order Number: [id number] Lab site:

## 2010-03-30 NOTE — Progress Notes (Signed)
Summary: TRIAGE  Phone Note Call from Patient Call back at [phone]   Caller: Patient Call For: Dr. Juanda Chance Reason for Call: Talk to Nurse Summary of Call: Discuss meds. Initial call taken by: Vallarie Mare,  August 03, 2009 9:29 AM  Follow-up for Phone Call        Pt. was seen  07-30-09 and Reglan discontinued, due to his concern over potential side effects, and he began Paxil.  Pt. states he is sleeping better on the Paxil, but is having increased nausea and bloating since stopping the Reglan.  Pt. wants to know what to do about the Gastroparesis, wonders if he needs to take Domperidone?   DR.Cashawn Yanko PLEASE ADVISE  Follow-up by: Laureen Ochs LPN,  August 03, 8117 10:31 AM  Additional Follow-up for Phone Call Additional follow up Details #1::        OK. He can try Domperidone. Available through mail from [facility] [place]. Domperidone 10 mg, # 90, 1 by mouth three times a day ac., 3 refills. Additional Follow-up by: Hart Carwin MD,  August 03, 2009 11:03 AM    Additional Follow-up for Phone Call Additional follow up Details #2::    Above MD orders reviewed with patient. Script and instructions on obtaining med. mailed to him. Pt. to keep scheduled office visit on 09-01-09. Pt. instructed to call back as needed.  Follow-up by: Laureen Ochs LPN,  August 03, 1476 11:57 AM  New/Updated Medications: * DOMPERIDONE 10MG  TABS Take one by mouth 30 minutes before breakfast, lunch and dinner. Prescriptions: DOMPERIDONE 10MG  TABS Take one by mouth 30 minutes before breakfast, lunch and dinner.  #90 x 3   Entered by:   Laureen Ochs LPN   Authorized by:   Hart Carwin MD   Signed by:   Laureen Ochs LPN on 29/56/2130   Method used:   Print then Mail to Patient   RxID:   [id number]

## 2010-03-30 NOTE — Progress Notes (Signed)
Summary: TRIAGE-Nausea  Phone Note Call from Patient Call back at CELL [phone]   Call For: DR Lu Paradise Summary of Call: Did not want to schedule next available appoinment on 3-22 with Dr Juanda Chance because he feels his nausea is getting worse every day. Had a Colon with Dr Juanda Chance back in May 2004 did not see any office visit appoinments. Takes medicine for the nausea but as soon as it wears off he gets it right back. Initial call taken by: Leanor Kail [facility],  April 01, 2009 11:21 AM  Follow-up for Phone Call        Message left for patient to callback. Laureen Ochs LPN  April 01, 2009 11:58 AM   Pt. will see Mike Gip PAC on 04-02-09 at 3pm. Advised of med.list/co-pay/cx.policy. Follow-up by: Laureen Ochs LPN,  April 01, 2009 12:39 PM

## 2010-03-30 NOTE — Assessment & Plan Note (Signed)
Summary: follow up GASTROPARESIS/lk   History of Present Illness Visit Type: Follow-up Visit Primary GI MD: Lina Sar MD Primary Provider: Birdie Sons, MD Requesting Provider: n/a Chief Complaint: f/u gastroparesis feels some better History of Present Illness:   This is a 60 year old African American male with gastroesophageal reflux and newly diagnosed gastroparesis. His gastric emptying scan showed 75% retention at one hour and 36% retention at 2 hours. He had mild gastritis and distal esophagitis with a small hiatal hernia.on endoscopy in February 2011. He feels better on Reglan 5 mg 3 times a day but he is concerned about the potential side effects. A CT scan of the abdomen and pelvis in February 2011 showed him to be status post prostatectomy  , otherwise normal exam. Patient comes today for routine follow up. He takes Zofran on a daily basis. Patient denies any abdominal pain. An abdominal ultrasound in February was negative.   GI Review of Systems    Reports nausea.      Denies abdominal pain, acid reflux, belching, bloating, chest pain, dysphagia with liquids, dysphagia with solids, heartburn, loss of appetite, vomiting, vomiting blood, weight loss, and  weight gain.      Reports change in bowel habits and  constipation.     Denies anal fissure, black tarry stools, diarrhea, diverticulosis, fecal incontinence, heme positive stool, hemorrhoids, irritable bowel syndrome, jaundice, light color stool, liver problems, rectal bleeding, and  rectal pain.    Current Medications (verified): 1)  Fexofenadine Hcl 180 Mg Tabs (Fexofenadine Hcl) .... Once Daily 2)  Fluticasone Propionate 50 Mcg/act  Susp (Fluticasone Propionate) .... One Puff As Directed Once Daily 3)  Propranolol-Hctz 40-25 Mg Tabs (Propranolol-Hctz) .... Take 1 Tablet By Mouth Twice A Day 4)  Detrol La 4 Mg  Xr24h-Cap (Tolterodine Tartrate) .... Take 1 Tablet By Mouth Once A Day 5)  Simvastatin 40 Mg Tabs (Simvastatin)  .... Take One Tablet At Bedtime 6)  Proair Hfa 108 (90 Base) Mcg/act Aers (Albuterol Sulfate) .... 2 Inhalations Qid As Directed 7)  Butalbital-Apap-Caffeine 50-325-40 Mg Caps (Butalbital-Apap-Caffeine) .... Take 1 Tablet By Mouth Once A Day As Needed 8)  Azelastine Hcl 137 Mcg/spray Soln (Azelastine Hcl) .... 2 Puffs Each Nostril Daily 9)  Nexium 40 Mg Cpdr (Esomeprazole Magnesium) .... Take 1 Tablet By Mouth Two Times A Day 10)  Ondansetron Hcl 4 Mg Tabs (Ondansetron Hcl) .... Take One Tablet By Mouth Every Other Day As Needed 11)  Reglan 5 Mg Tabs (Metoclopramide Hcl) .... Take 2 Tablets By Mouth Three Times A Day (Before Breakfast, Lunch and Dinner) 12)  Promethazine Hcl 25 Mg Tabs (Promethazine Hcl) .... Take One Tablet By Mouth Every Other Day As Needed Nausea  Allergies (verified): 1)  Darvon  Past History:  Past Medical History: Reviewed history from 10/15/2007 and no changes required. Prostate cancer, hx of Headache Hypertension elevated LFT's--Hep B & C neg elevated PSA Asthma, as child Hyperlipidemia Allergic rhinitis  Past Surgical History: Reviewed history from 04/02/2009 and no changes required. Prostatectomy 5/12/8 Appendectomy ROTATOR CUFF REPAIR  9/09  Family History: Reviewed history from 05/01/2009 and no changes required. Family History Hypertension-father, 2 brothers mother died age 50 pancreatic cancer No FH of Colon Cancer:  Social History: Reviewed history from 04/02/2009 and no changes required. Married 4 healthy children Occupation: [employer] Alcohol Use - no Illicit Drug Use - no  Review of Systems       Pertinent positive and negative review of systems were noted in the above HPI.  All other ROS was otherwise negative.   Vital Signs:  Patient profile:   60 year old male Height:      69.5 inches Weight:      195 pounds BMI:     28.49 Pulse rate:   68 / minute Pulse rhythm:   regular BP sitting:   150 / 80  (left arm)  Vitals  Entered By: Chales Abrahams CMA Duncan Dull) (July 30, 2009 8:33 AM)  Physical Exam  General:  Well developed, well nourished, no acute distress. Eyes:  PERRLA, no icterus. Mouth:  No deformity or lesions, dentition normal. Lungs:  Clear throughout to auscultation. Heart:  Regular rate and rhythm; no murmurs, rubs,  or bruits. Abdomen:  soft, nontender abdomen with normoactive bowel sounds. No mass or fullness. Liver edge at costal margin. Extremities:  No clubbing, cyanosis, edema or deformities noted.   Impression & Recommendations:  Problem # 1:  GASTROPARESIS (ICD-536.3) Patient has mild gastroparesis. I suspect he also has functional dyspepsia. We need to rule out biliary dysfunction or depression causing GI symptoms.  We will try Paxil 10 mg at bedtime. I have advised him to stop  Reglan because of potential CNS side effects . We will go ahead with a HIDA scan with [facility] to rule out a poorly functioning gallbladder. We will refill his Zofran as well.  Problem # 2:  GERD (ICD-530.81) Patient's reflux is controlled on Nexium 40 mg twice a day.  Problem # 3:  SPECIAL SCREENING FOR MALIGNANT NEOPLASMS COLON (ICD-V76.51) Patient's last colonoscopy in 2004 was normal.  Other Orders: HIDA CCK (HIDA CCK)  Patient Instructions: 1)  discontinue Reglan. 2)  Continue Nexium 40 mg twice a day. 3)  Schedule HIDA scan with [facility]. 4)  Begin Paxil 10 mg daily. 5)  Refill on Zofran. 6)  Copy sent to Dr B.Swords  7)  The medication list was reviewed and reconciled.  All changed / newly prescribed medications were explained.  A complete medication list was provided to the patient / caregiver. Prescriptions: PAXIL 10 MG TABS (PAROXETINE HCL) Take 1 tab by mouth at bedtime  #30 x 2   Entered by:   Lamona Curl CMA (AAMA)   Authorized by:   Hart Carwin MD   Signed by:   Lamona Curl CMA (AAMA) on 07/30/2009   Method used:   [facility] to        [facility] [phone]* (retail)       [address]       [place], [place]  [address]       Ph: [phone] or [phone]       Fax: [phone]   RxID:   [id number] ONDANSETRON HCL 4 MG TABS (ONDANSETRON HCL) Take one tablet by mouth every other day as needed  #30 x 1   Entered by:   Lamona Curl CMA (AAMA)   Authorized by:   Hart Carwin MD   Signed by:   Lamona Curl CMA (AAMA) on 07/30/2009   Method used:   [facility] to        [facility] [phone]* (retail)       [address]       [place], [place]  [address]       Ph: [phone] or [phone]       Fax: [phone]   RxID:   [id number]

## 2010-03-30 NOTE — Assessment & Plan Note (Signed)
Summary: F/U ON LABS // RS   Vital Signs:  Patient profile:   60 year old male Weight:      200 pounds BMI:     29.22 Temp:     98.1 degrees F Pulse rate:   78 / minute Resp:     12 per minute BP sitting:   132 / 86  (left arm)  Vitals Entered By: Gladis Riffle, RN (March 31, 2009 8:31 AM) CC: follow up labs--saw MD in [place] and put on prev-pak x 4 days  and then nexium for nausea and "acid in throat" Is Patient Diabetic? No   CC:  follow up labs--saw MD in [place] and put on prev-pak x 4 days  and then nexium for nausea and "acid in throat".  History of Present Illness:  Follow-Up Visit      This is a 60 year old man who presents for Follow-up visit.  The patient denies chest pain and palpitations.  Since the last visit the patient notes no new problems or concerns.  The patient reports taking meds as prescribed.  When questioned about possible medication side effects, the patient notes none.   All other systems reviewed and were negative   Preventive Screening-Counseling & Management  Alcohol-Tobacco     Smoking Status: quit > 6 months     Year Started: 1971     Year Quit: 1975  Current Problems (verified): 1)  Hyperlipidemia  (ICD-272.4) 2)  Physical Examination  (ICD-V70.0) 3)  Liver Function Tests, Abnormal  (ICD-794.8) 4)  Asthma  (ICD-493.90) 5)  Hypertension  (ICD-401.9) 6)  Headache  (ICD-784.0) 7)  Prostate Cancer, Hx of  (ICD-V10.46)  Current Medications (verified): 1)  Fexofenadine Hcl 180 Mg Tabs (Fexofenadine Hcl) .... Once Daily 2)  Fluticasone Propionate 50 Mcg/act  Susp (Fluticasone Propionate) .... One Puff As Directed Once Daily 3)  Propranolol-Hctz 40-25 Mg Tabs (Propranolol-Hctz) .... Take 1 Tablet By Mouth Twice A Day 4)  Detrol La 4 Mg  Xr24h-Cap (Tolterodine Tartrate) .... Take 1 Tablet By Mouth Once A Day 5)  Simvastatin 40 Mg Tabs (Simvastatin) .... Take One Tablet At Bedtime 6)  Proair Hfa 108 (90 Base) Mcg/act Aers (Albuterol  Sulfate) .... 2 Inhalations Qid As Directed 7)  Butalbital-Apap-Caffeine 50-325-40 Mg Caps (Butalbital-Apap-Caffeine) .... Take 1 Tablet By Mouth Once A Day As Needed 8)  Azelastine Hcl 137 Mcg/spray Soln (Azelastine Hcl) .... 2 Puffs Each Nostril Daily 9)  Promethazine Hcl 25 Mg  Tabs (Promethazine Hcl) .... As Needed 10)  Nexium 40 Mg Cpdr (Esomeprazole Magnesium) .... Take 1 Tablet By Mouth Once A Day  Allergies: 1)  Darvon (Propoxyphene Hcl)  Comments:  Nurse/Medical Assistant: FU labs--saw MD in [place] for acid in stonach and given prev-pak x 4 days and then nexium--continues nausea, not relieved with promethazine  The patient's medications and allergies were reviewed with the patient and were updated in the Medication and Allergy Lists. Gladis Riffle, RN (March 31, 2009 8:36 AM)  Past History:  Past Medical History: Last updated: 10/15/2007 Prostate cancer, hx of Headache Hypertension elevated LFT's--Hep B & C neg elevated PSA Asthma, as child Hyperlipidemia Allergic rhinitis  Past Surgical History: Last updated: 01/22/2008 Prostatectomy 5/12/8 Appendectomy shoulder surgery 2009  Family History: Last updated: 03/12/2007 Family History Hypertension-father, 2 brothers mother died age 62 pancreatic cancer  Social History: Last updated: 03/12/2007 Married 4 healthy children  Risk Factors: Smoking Status: quit > 6 months (03/31/2009)  Physical Exam  General:  Mark [name]  no acute distress; alert,appropriate and cooperative throughout examination Head:  normocephalic and atraumatic.   Eyes:  pupils equal and pupils round.   Neck:  supple no adenopathy Chest Wall:  No deformities, masses, tenderness or gynecomastia noted. Lungs:  Normal respiratory effort, chest expands symmetrically. Lungs are clear to auscultation, no crackles or wheezes. Heart:  Normal rate and regular rhythm. S1 and S2 normal without gallop, murmur, click, rub or  other extra sounds. Abdomen:  Bowel sounds positive,abdomen soft and non-tender without masses, organomegaly or hernias noted. Msk:  No deformity or scoliosis noted of thoracic or lumbar spine.   Neurologic:  cranial nerves II-XII intact and gait normal.   Skin:  turgor normal.   Psych:  memory intact for recent and remote and not anxious appearing.     Impression & Recommendations:  Problem # 1:  HYPERTENSION (ICD-401.9) controlled continue current medications  His updated medication list for this problem includes:    Propranolol-hctz 40-25 Mg Tabs (Propranolol-hctz) .[name] Kitchen... Take 1 tablet by mouth twice a day  BP today: 132/86 Prior BP: 124/90 (02/18/2009)  Labs Reviewed: K+: 3.5 (12/16/2008) Creat: : 0.9 (12/16/2008)   Chol: 138 (03/24/2009)   HDL: 42.70 (03/24/2009)   LDL: 91 (12/16/2008)   TG: 220.0 (03/24/2009)  Problem # 2:  HYPERLIPIDEMIA (ICD-272.4) controlled continue current medications  His updated medication list for this problem includes:    Simvastatin 40 Mg Tabs (Simvastatin) .[name] Kitchen... Take one tablet at bedtime  Labs Reviewed: SGOT: 30 (03/24/2009)   SGPT: 41 (03/24/2009)   HDL:42.70 (03/24/2009), 47.00 (12/16/2008)  LDL:91 (12/16/2008), DEL (50/35/4656)  Chol:138 (03/24/2009), 171 (12/16/2008)  Trig:220.0 (03/24/2009), 165.0 (12/16/2008)  Problem # 3:  LIVER FUNCTION TESTS, ABNORMAL (ICD-794.8) resolved reviewed hep b and c serologies  Problem # 4:  NAUSEA, CHRONIC (ICD-787.02)  chronic nausea---unclear etiology has seen GI treated for H Pylori  he thinks exacerbated by soft drinks.  I do not want him to take zofran two times a day   Orders: Gastroenterology Referral (GI)  His updated medication list for this problem includes:    Ondansetron Hcl 4 Mg Tabs (Ondansetron hcl) .[name] Kitchen... 1 by mouth once daily as needed anxiety  Complete Medication List: 1)  Fexofenadine Hcl 180 Mg Tabs (Fexofenadine hcl) .... Once daily 2)  Fluticasone Propionate 50 Mcg/act Susp  (Fluticasone propionate) .... One puff as directed once daily 3)  Propranolol-hctz 40-25 Mg Tabs (Propranolol-hctz) .... Take 1 tablet by mouth twice a day 4)  Detrol La 4 Mg Xr24h-cap (Tolterodine tartrate) .... Take 1 tablet by mouth once a day 5)  Simvastatin 40 Mg Tabs (Simvastatin) .... Take one tablet at bedtime 6)  Proair Hfa 108 (90 Base) Mcg/act Aers (Albuterol sulfate) .... 2 inhalations qid as directed 7)  Butalbital-apap-caffeine 50-325-40 Mg Caps (Butalbital-apap-caffeine) .... Take 1 tablet by mouth once a day as needed 8)  Azelastine Hcl 137 Mcg/spray Soln (Azelastine hcl) .... 2 puffs each nostril daily 9)  Nexium 40 Mg Cpdr (Esomeprazole magnesium) .... Take 1 tablet by mouth once a day 10)  Ondansetron Hcl 4 Mg Tabs (Ondansetron hcl) .[name] Kitchen.. 1 by mouth once daily as needed anxiety Prescriptions: ONDANSETRON HCL 4 MG TABS (ONDANSETRON HCL) 1 by mouth once daily as needed anxiety  #20 x 2   Entered and Authorized by:   Birdie Sons MD   Signed by:   Birdie Sons MD on 03/31/2009   Method used:   [facility] [address] [phone]* (retail)  [address]       [place], [place]  [address]       Ph: [phone] or [phone]       Fax: [phone]   RxID:   [id number] PROMETHAZINE HCL 25 MG  TABS (PROMETHAZINE HCL) 1 by mouth once daily as needed not to exceed 10 per month  #20 x 3   Entered and Authorized by:   Birdie Sons MD   Signed by:   Birdie Sons MD on 03/31/2009   Method used:   [facility] to        [facility]. [phone]* (retail)       [address]       [place], [place]  [address]       Ph: [phone] or [phone]       Fax: [phone]   RxID:   [id number]

## 2010-03-30 NOTE — Assessment & Plan Note (Signed)
Summary: CPX // RS   Vital Signs:  Patient profile:   60 year old male Height:      69.5 inches Weight:      194 pounds BMI:     28.34 Pulse rate:   60 / minute Pulse rhythm:   regular Resp:     12 per minute BP sitting:   136 / 88  (left arm) Cuff size:   regular  Vitals Entered By: Gladis Riffle, RN (Jul 07, 2009 9:06 AM)  Nutrition Counseling: Patient's BMI is greater than 25 and therefore counseled on weight management options. CC: cpx, labs done--uses zofran and promethazine intermittently but not together Is Patient Diabetic? No   Primary Care Provider:  Birdie Sons, MD  CC:  cpx and labs done--uses zofran and promethazine intermittently but not together.  History of Present Illness: cpx  in addition to health maintenance he wants to discuss gastroparesis: complains of nausea---ongoing problem ---has seen dr brodie---reviewed records he prefers to choose between promethazine and ondansetron  Back pain---ongoing but intermittent. no weakness, radiation of pain to left leg. some relief with OTC meds.   Preventive Screening-Counseling & Management  Alcohol-Tobacco     Smoking Status: quit > 6 months     Year Started: 1971     Year Quit: 1975  Current Problems (verified): 1)  Gastroparesis  (ICD-536.3) 2)  Hiatal Hernia  (ICD-553.3) 3)  Hx of Transaminases, Serum, Elevated  (ICD-790.4) 4)  Gerd  (ICD-530.81) 5)  Hyperlipidemia  (ICD-272.4) 6)  Physical Examination  (ICD-V70.0) 7)  Asthma  (ICD-493.90) 8)  Hypertension  (ICD-401.9) 9)  Headache  (ICD-784.0) 10)  Prostate Cancer, Hx of  (ICD-V10.46)  Current Medications (verified): 1)  Fexofenadine Hcl 180 Mg Tabs (Fexofenadine Hcl) .... Once Daily 2)  Fluticasone Propionate 50 Mcg/act  Susp (Fluticasone Propionate) .... One Puff As Directed Once Daily 3)  Propranolol-Hctz 40-25 Mg Tabs (Propranolol-Hctz) .... Take 1 Tablet By Mouth Twice A Day 4)  Detrol La 4 Mg  Xr24h-Cap (Tolterodine Tartrate) .... Take 1  Tablet By Mouth Once A Day 5)  Simvastatin 40 Mg Tabs (Simvastatin) .... Take One Tablet At Bedtime 6)  Proair Hfa 108 (90 Base) Mcg/act Aers (Albuterol Sulfate) .... 2 Inhalations Qid As Directed 7)  Butalbital-Apap-Caffeine 50-325-40 Mg Caps (Butalbital-Apap-Caffeine) .... Take 1 Tablet By Mouth Once A Day As Needed 8)  Azelastine Hcl 137 Mcg/spray Soln (Azelastine Hcl) .... 2 Puffs Each Nostril Daily 9)  Nexium 40 Mg Cpdr (Esomeprazole Magnesium) .... Take 1 Tablet By Mouth Two Times A Day 10)  Ondansetron Hcl 4 Mg Tabs (Ondansetron Hcl) .[name] Kitchen.. 1 By Mouth Every 6 Hours As Needed For Nausea. 11)  Reglan 5 Mg Tabs (Metoclopramide Hcl) .... Take 2 Tablets By Mouth Three Times A Day (Before Breakfast, Lunch and Dinner) 12)  Promethazine Hcl 25 Mg Tabs (Promethazine Hcl) .... As Needed Nausea  Allergies: 1)  Darvon (Propoxyphene Hcl)  Past History:  Past Medical History: Last updated: 10/15/2007 Prostate cancer, hx of Headache Hypertension elevated LFT's--Hep B & C neg elevated PSA Asthma, as child Hyperlipidemia Allergic rhinitis  Past Surgical History: Last updated: 04/02/2009 Prostatectomy 5/12/8 Appendectomy ROTATOR CUFF REPAIR  9/09  Family History: Last updated: 05/01/2009 Family History Hypertension-father, 2 brothers mother died age 24 pancreatic cancer No FH of Colon Cancer:  Social History: Last updated: 04/02/2009 Married 4 healthy children Occupation: [employer] Alcohol Use - no Illicit Drug Use - no  Risk Factors: Smoking Status: quit > 6 months (07/07/2009)  Review of Systems       All other systems reviewed and were negative   Physical Exam  General:  alert and well-developed.   Head:  normocephalic and atraumatic.   Eyes:  pupils equal and pupils round.   Ears:  R ear normal and L ear normal.   Nose:  no external deformity and no external erythema.   Neck:  supple no adenopathy Chest Wall:  No deformities, masses, tenderness or gynecomastia  noted. Lungs:  Normal respiratory effort, chest expands symmetrically. Lungs are clear to auscultation, no crackles or wheezes. Heart:  Normal rate and regular rhythm. S1 and S2 normal without gallop, murmur, click, rub or other extra sounds. Abdomen:  soft and non-tender.   Prostate:  per urology Msk:  No deformity or scoliosis noted of thoracic or lumbar spine.   Neurologic:  cranial nerves II-XII intact and gait normal.     Impression & Recommendations:  Problem # 1:  PHYSICAL EXAMINATION (ICD-V70.0) health maint UTD encouraged low fat diet and regular exercise  reviewed labs  Problem # 2:  PROSTATE CANCER, HX OF (ICD-V10.46) followed by urology  Problem # 3:  HYPERLIPIDEMIA (ICD-272.4) controlled His updated medication list for this problem includes:    Simvastatin 40 Mg Tabs (Simvastatin) .[name] Kitchen... Take one tablet at bedtime  Labs Reviewed: SGOT: 28 (07/01/2009)   SGPT: 39 (07/01/2009)   HDL:39.30 (07/01/2009), 42.70 (03/24/2009)  LDL:92 (07/01/2009), 91 (12/16/2008)  Chol:158 (07/01/2009), 138 (03/24/2009)  Trig:136.0 (07/01/2009), 220.0 (03/24/2009)  Problem # 4:  GERD (ICD-530.81) well controlled His updated medication list for this problem includes:    Nexium 40 Mg Cpdr (Esomeprazole magnesium) .[name] Kitchen... Take 1 tablet by mouth two times a day  Problem # 5:  HYPERTENSION (ICD-401.9) fair control advised weight loss of 15 pounds continue current medications  His updated medication list for this problem includes:    Propranolol-hctz 40-25 Mg Tabs (Propranolol-hctz) .[name] Kitchen... Take 1 tablet by mouth twice a day  BP today: 136/88 Prior BP: 128/86 (05/01/2009)  Labs Reviewed: K+: 4.3 (07/01/2009) Creat: : 0.9 (07/01/2009)   Chol: 158 (07/01/2009)   HDL: 39.30 (07/01/2009)   LDL: 92 (07/01/2009)   TG: 136.0 (07/01/2009)  Problem # 6:  GASTROPARESIS (ICD-536.3) follwed by dr Juanda Chance he has some sxs of nausea uses zofran and or phenergan  Problem # 7:  BACK PAIN  (ICD-724.5)  discussed he is to young and healthy to use medications for back pain---refer to PT  Orders: Physical Therapy Referral (PT)  Complete Medication List: 1)  Fexofenadine Hcl 180 Mg Tabs (Fexofenadine hcl) .... Once daily 2)  Fluticasone Propionate 50 Mcg/act Susp (Fluticasone propionate) .... One puff as directed once daily 3)  Propranolol-hctz 40-25 Mg Tabs (Propranolol-hctz) .... Take 1 tablet by mouth twice a day 4)  Detrol La 4 Mg Xr24h-cap (Tolterodine tartrate) .... Take 1 tablet by mouth once a day 5)  Simvastatin 40 Mg Tabs (Simvastatin) .... Take one tablet at bedtime 6)  Proair Hfa 108 (90 Base) Mcg/act Aers (Albuterol sulfate) .... 2 inhalations qid as directed 7)  Butalbital-apap-caffeine 50-325-40 Mg Caps (Butalbital-apap-caffeine) .... Take 1 tablet by mouth once a day as needed 8)  Azelastine Hcl 137 Mcg/spray Soln (Azelastine hcl) .... 2 puffs each nostril daily 9)  Nexium 40 Mg Cpdr (Esomeprazole magnesium) .... Take 1 tablet by mouth two times a day 10)  Ondansetron Hcl 4 Mg Tabs (Ondansetron hcl) .... Take one tablet by mouth every other day as needed 11)  Reglan 5 Mg Tabs (  Metoclopramide hcl) .... Take 2 tablets by mouth three times a day (before breakfast, lunch and dinner) 12)  Promethazine Hcl 25 Mg Tabs (Promethazine hcl) .... Take one tablet by mouth every other day as needed nausea   Patient Instructions: 1)  Please schedule a follow-up appointment in 3 months. Prescriptions: PROMETHAZINE HCL 25 MG TABS (PROMETHAZINE HCL) Take one tablet by mouth every other day as needed nausea  #30 x 3   Entered and Authorized by:   Birdie Sons MD   Signed by:   Birdie Sons MD on 07/07/2009   Method used:   [facility]        [facility]. [phone]* (retail)       [address]       [place], [place]  [address]       Ph: [phone] or [phone]       Fax: [phone]   RxID:   [id number] PROMETHAZINE HCL 25 MG TABS (PROMETHAZINE  HCL) Take one tablet by mouth every other day as needed nausea  #10 x 3   Entered and Authorized by:   Birdie Sons MD   Signed by:   Birdie Sons MD on 07/07/2009   Method used:   [facility]        [facility] [address] [phone]* (retail)       [address]       [place], [place]  [address]       Ph: [phone] or [phone]       Fax: [phone]   RxID:   [id number]

## 2010-03-30 NOTE — Miscellaneous (Signed)
Summary: Orders Update  Clinical Lists Changes  Orders: Added new Test order of TLB-CBC Platelet - w/Differential (85025-CBCD) - Signed Added new Test order of TLB-Renal Function Panel (80069-RENAL) - Signed

## 2010-03-30 NOTE — Assessment & Plan Note (Signed)
Summary: 3 month f.u.[name] KitchenMarland KitchenMarland Kitchenem   History of Present Illness Visit Type: Follow-up Visit Primary GI MD: Lina Sar MD Primary Provider: Birdie Sons, MD Requesting Provider: n/a Chief Complaint: Three month f/u for nausea. Pt states that he is still having nausea  History of Present Illness:   This is a 60 year old African American male with chronic intermittent nausea. He has improved over the past 6-12 months. He no longer vomits. He has been on Paxil 20 mg a day and domperidone 10 mg 3 times a day. His gastric emptying scan was slightly abnormal with a retention of 36% at 2 hours. He takes Zofran 4 mg a day p.r.n. An extensive GI evaluation included an upper endoscopy in February 2011, showing mild gastritis and esophagitis as well as a small hiatal hernia. A CT Scan of the abdomen and pelvis in February 2011 was normal and showed patient to be status post prostatectomy. An upper abdominal ultrasound in February 2011 was negative and a HIDA scan showed a 65.6% ejection fraction. His last colonoscopy was in 2004.   GI Review of Systems    Reports nausea.      Denies abdominal pain, acid reflux, belching, bloating, chest pain, dysphagia with liquids, dysphagia with solids, heartburn, loss of appetite, vomiting, vomiting blood, weight loss, and  weight gain.        Denies anal fissure, black tarry stools, change in bowel habit, constipation, diarrhea, diverticulosis, fecal incontinence, heme positive stool, hemorrhoids, irritable bowel syndrome, jaundice, light color stool, liver problems, rectal bleeding, and  rectal pain.    Current Medications (verified): 1)  Fexofenadine Hcl 180 Mg Tabs (Fexofenadine Hcl) .... Once Daily 2)  Fluticasone Propionate 50 Mcg/act  Susp (Fluticasone Propionate) .... One Puff As Directed Once Daily 3)  Propranolol-Hctz 40-25 Mg Tabs (Propranolol-Hctz) .... Take 1 Tablet By Mouth Twice A Day 4)  Detrol La 4 Mg  Xr24h-Cap (Tolterodine Tartrate) .... Take 1 Tablet By  Mouth Once A Day 5)  Simvastatin 40 Mg Tabs (Simvastatin) .... Take One Tablet At Bedtime 6)  Proair Hfa 108 (90 Base) Mcg/act Aers (Albuterol Sulfate) .... 2 Inhalations Qid As Directed 7)  Butalbital-Apap-Caffeine 50-325-40 Mg Caps (Butalbital-Apap-Caffeine) .... Take 1 Tablet By Mouth Once A Day As Needed 8)  Azelastine Hcl 137 Mcg/spray Soln (Azelastine Hcl) .... 2 Puffs Each Nostril Daily 9)  Nexium 40 Mg Cpdr (Esomeprazole Magnesium) .... Take 1 Tablet By Mouth Two Times A Day 10)  Ondansetron Hcl 4 Mg Tabs (Ondansetron Hcl) .... Take One Tablet By Mouth Every Other Day As Needed 11)  Promethazine Hcl 25 Mg Tabs (Promethazine Hcl) .... Take One Tablet By Mouth Every Other Day As Needed Nausea 12)  Paxil 20 Mg Tabs (Paroxetine Hcl) .... Take 1 Tablet By Mouth Once A Day 13)  Domperidone 10mg  Tabs .... Take One By Mouth 30 Minutes Before Breakfast, Lunch and Dinner.  Allergies (verified): 1)  Darvon  Past History:  Past Medical History: Reviewed history from 10/15/2007 and no changes required. Prostate cancer, hx of Headache Hypertension elevated LFT's--Hep B & C neg elevated PSA Asthma, as child Hyperlipidemia Allergic rhinitis  Past Surgical History: Reviewed history from 04/02/2009 and no changes required. Prostatectomy 5/12/8 Appendectomy ROTATOR CUFF REPAIR  9/09  Family History: Reviewed history from 05/01/2009 and no changes required. Family History Hypertension-father, 2 brothers mother died age 12 pancreatic cancer No FH of Colon Cancer:  Social History: Reviewed history from 04/02/2009 and no changes required. Married 4 healthy children Occupation: [employer] Alcohol  Use - no Illicit Drug Use - no  Review of Systems  The patient denies allergy/sinus, anemia, anxiety-new, arthritis/joint pain, back pain, blood in urine, breast changes/lumps, change in vision, confusion, cough, coughing up blood, depression-new, fainting, fatigue, fever, headaches-new,  hearing problems, heart murmur, heart rhythm changes, itching, menstrual pain, muscle pains/cramps, night sweats, nosebleeds, pregnancy symptoms, shortness of breath, skin rash, sleeping problems, sore throat, swelling of feet/legs, swollen lymph glands, thirst - excessive , urination - excessive , urination changes/pain, urine leakage, vision changes, and voice change.         .rod   Vital Signs:  Patient profile:   60 year old male Height:      69.5 inches Weight:      200 pounds BMI:     29.22 BSA:     2.08 Pulse rate:   76 / minute Pulse rhythm:   regular BP sitting:   136 / 80  (left arm) Cuff size:   regular  Vitals Entered By: Ok Anis CMA (November 30, 2009 8:40 AM)  Physical Exam  General:  Well developed, well nourished, no acute distress. Eyes:  PERRLA, no icterus. Mouth:  No deformity or lesions, dentition normal. Lungs:  Clear throughout to auscultation. Heart:  Regular rate and rhythm; no murmurs, rubs,  or bruits. Abdomen:  Soft, nontender and nondistended. No masses, hepatosplenomegaly or hernias noted. Normal bowel sounds. Skin:  Intact without significant lesions or rashes. Psych:  Alert and cooperative. Normal mood and affect.   Impression & Recommendations:  Problem # 1:  GASTROPARESIS (ICD-536.3) Patient has had symptomatic improvement on domperidone 10 mg 3 times a day. We will continue the same regimen.  Problem # 2:  ANXIETY STATE, UNSPECIFIED (ICD-300.00) Patient has been much improved on Paxil 20 mg a day. He wants to stay on it. His symptoms are now much more manageable.  Problem # 3:  PHYSICAL EXAMINATION (ICD-V70.0) last screening colonoscopy in 2004 recall in 2014  Patient Instructions: 1)  Paxil 20 mg daily. 2)  Domperidone 10 mg t.i.d. a.c. 3)  Recall colonoscopy 2014. 4)  Reduce Nexium to 40 mg p.o. q.d. 5)  Office visit 6 months. 6)  Copy sent to : Dr B.Swords 7)  The medication list was reviewed and reconciled.  All changed / newly  prescribed medications were explained.  A complete medication list was provided to the patient / caregiver.

## 2010-03-30 NOTE — Progress Notes (Signed)
  Phone Note Outgoing Call   Call placed by: Oda Cogan,  April 08, 2009 1:11 PM Call placed to: Patient Summary of Call: Called pt to check on him after procedure on 04-08-09, pt states needs a new prescription for Zofran-will run out on02-10-11 . Continues to have nausea- had vomiting until 12:00 noon today, unsure of fever due to thermometer  not working, feels better than yesterday  Initial call taken by: Oda Cogan,  April 08, 2009 1:15 PM     Appended Document:  OK to have Zofran refilled.  Appended Document:  Message left for patient to callback.   Appended Document: Med Update Pt. is feeling better today, he is using the Zofran every 6 hours and that is controlling his nausea. He will have the CT done tomorrow. Pt. instructed to call back as needed.    Clinical Lists Changes  Medications: Changed medication from ONDANSETRON HCL 4 MG TABS (ONDANSETRON HCL) 1 by mouth once daily as needed nausea to ONDANSETRON HCL 4 MG TABS (ONDANSETRON HCL) 1 by mouth every 6 hours as needed for nausea. - Signed Rx of ONDANSETRON HCL 4 MG TABS (ONDANSETRON HCL) 1 by mouth every 6 hours as needed for nausea.;  #20 x 1;  Signed;  Entered by: Laureen Ochs LPN;  Authorized by: Hart Carwin MD;  Method used: [facility] to [facility]. [phone]*, [address], [place], [place], [place]  [address], Ph: [phone] or [phone], Fax: [phone]    Prescriptions: ONDANSETRON HCL 4 MG TABS (ONDANSETRON HCL) 1 by mouth every 6 hours as needed for nausea.  #20 x 1   Entered by:   Laureen Ochs LPN   Authorized by:   Hart Carwin MD   Signed by:   Laureen Ochs LPN on 38/75/6433   Method used:   Electronically to        [facility]. [phone]* (retail)       [address]       [place], [place]  [address]       Ph: [phone] or [phone]       Fax: [phone]   RxID:   [id number]

## 2010-03-30 NOTE — Letter (Signed)
Summary: Return To Work Form / [employer]  Return To Work Form / [employer]   Imported By: Lennie Odor 04/24/2009 15:10:52  _____________________________________________________________________  External Attachment:    Type:   Image     Comment:   External Document

## 2010-03-30 NOTE — Miscellaneous (Signed)
Summary: Initial Summary for PT Services/[place]  Initial Summary for PT Services/[place]   Imported By: Maryln Gottron 07/14/2009 13:20:05  _____________________________________________________________________  External Attachment:    Type:   Image     Comment:   External Document

## 2010-03-30 NOTE — Progress Notes (Signed)
Summary: GES and REV Scheduled  ---- Converted from flag ---- ---- 04/14/2009 1:44 PM, Hart Carwin MD wrote: I have spoken to the pt. Please schedule GES in [facility]. ------------------------------  Phone Note Outgoing Call   Call placed by: Laureen Ochs LPN,  April 14, 2009 4:22 PM Call placed to: Patient Summary of Call: Pt. is scheduled for a GES at [facility] on 04-24-09 at 8am, NPO after 12mn. All information given to him by phone and mailed to him. His REV has been rescheduled to 05-01-09 at 9am. Pt. instructed to call back as needed.  Initial call taken by: Laureen Ochs LPN,  April 14, 2009 4:24 PM

## 2010-03-30 NOTE — Progress Notes (Signed)
Summary: fexofenadine refill  Phone Note Refill Request Message from:  Fax from Pharmacy on September 03, 2009 2:04 PM  Refills Requested: Medication #1:  FEXOFENADINE HCL 180 MG TABS once daily Initial call taken by: Kern Reap CMA Duncan Dull),  September 03, 2009 2:04 PM    Prescriptions: FEXOFENADINE HCL 180 MG TABS (FEXOFENADINE HCL) once daily  #100 x 3   Entered by:   Kern Reap CMA (AAMA)   Authorized by:   Birdie Sons MD   Signed by:   Kern Reap CMA (AAMA) on 09/03/2009   Method used:   Electronically to        [facility]* (mail-order)       [address] [place], [place]  [address]       Ph: [phone]       Fax: [phone]   RxID:   [id number]

## 2010-03-30 NOTE — Assessment & Plan Note (Signed)
Summary: 3 month rov/njr   Vital Signs:  Patient profile:   60 year old male Weight:      197 pounds BMI:     28.78 Temp:     98.6 degrees F oral Pulse rate:   60 / minute Pulse rhythm:   regular Resp:     12 per minute BP sitting:   138 / 80  (left arm) Cuff size:   regular  Vitals Entered By: Gladis Riffle, RN (October 07, 2009 8:43 AM) CC: 3 month rov Is Patient Diabetic? No   Primary Care Provider:  Birdie Sons, MD  CC:  3 month rov.  History of Present Illness:  Follow-Up Visit      This is a 60 year old man who presents for Follow-up visit.  The patient denies chest pain and palpitations.  Since the last visit the patient notes no new problems or concerns.  The patient reports taking meds as prescribed.  When questioned about possible medication side effects, the patient notes none.   Says physical therapy worked great All other systems reviewed and were negative   Preventive Screening-Counseling & Management  Alcohol-Tobacco     Smoking Status: quit > 6 months     Year Started: 1971     Year Quit: 1975  Current Problems (verified): 1)  Anxiety State, Unspecified  (ICD-300.00) 2)  Dyspepsia  (ICD-536.8) 3)  Special Screening For Malignant Neoplasms Colon  (ICD-V76.51) 4)  Back Pain  (ICD-724.5) 5)  Gastroparesis  (ICD-536.3) 6)  Hiatal Hernia  (ICD-553.3) 7)  Hx of Transaminases, Serum, Elevated  (ICD-790.4) 8)  Gerd  (ICD-530.81) 9)  Hyperlipidemia  (ICD-272.4) 10)  Physical Examination  (ICD-V70.0) 11)  Asthma  (ICD-493.90) 12)  Hypertension  (ICD-401.9) 13)  Headache  (ICD-784.0) 14)  Prostate Cancer, Hx of  (ICD-V10.46)  Current Medications (verified): 1)  Fexofenadine Hcl 180 Mg Tabs (Fexofenadine Hcl) .... Once Daily 2)  Fluticasone Propionate 50 Mcg/act  Susp (Fluticasone Propionate) .... One Puff As Directed Once Daily 3)  Propranolol-Hctz 40-25 Mg Tabs (Propranolol-Hctz) .... Take 1 Tablet By Mouth Twice A Day 4)  Detrol La 4 Mg  Xr24h-Cap  (Tolterodine Tartrate) .... Take 1 Tablet By Mouth Once A Day 5)  Simvastatin 40 Mg Tabs (Simvastatin) .... Take One Tablet At Bedtime 6)  Proair Hfa 108 (90 Base) Mcg/act Aers (Albuterol Sulfate) .... 2 Inhalations Qid As Directed 7)  Butalbital-Apap-Caffeine 50-325-40 Mg Caps (Butalbital-Apap-Caffeine) .... Take 1 Tablet By Mouth Once A Day As Needed 8)  Azelastine Hcl 137 Mcg/spray Soln (Azelastine Hcl) .... 2 Puffs Each Nostril Daily 9)  Nexium 40 Mg Cpdr (Esomeprazole Magnesium) .... Take 1 Tablet By Mouth Two Times A Day 10)  Ondansetron Hcl 4 Mg Tabs (Ondansetron Hcl) .... Take One Tablet By Mouth Every Other Day As Needed 11)  Promethazine Hcl 25 Mg Tabs (Promethazine Hcl) .... Take One Tablet By Mouth Every Other Day As Needed Nausea 12)  Paxil 20 Mg Tabs (Paroxetine Hcl) .... Take 1 Tablet By Mouth Once A Day 13)  Domperidone 10mg  Tabs .... Take One By Mouth 30 Minutes Before Breakfast, Lunch and Dinner.  Allergies: 1)  Darvon  Past History:  Past Medical History: Last updated: 10/15/2007 Prostate cancer, hx of Headache Hypertension elevated LFT's--Hep B & C neg elevated PSA Asthma, as child Hyperlipidemia Allergic rhinitis  Past Surgical History: Last updated: 04/02/2009 Prostatectomy 5/12/8 Appendectomy ROTATOR CUFF REPAIR  9/09  Family History: Last updated: 05/01/2009 Family History Hypertension-father, 2 brothers mother  died age 75 pancreatic cancer No FH of Colon Cancer:  Social History: Last updated: 04/02/2009 Married 4 healthy children Occupation: [employer] Alcohol Use - no Illicit Drug Use - no  Risk Factors: Smoking Status: quit > 6 months (10/07/2009)  Physical Exam  General:  alert and well-developed.   Head:  normocephalic and atraumatic.   Eyes:  pupils equal and pupils round.   Ears:  R ear normal and L ear normal.   Neck:  supple no adenopathy Lungs:  Normal respiratory effort, chest expands symmetrically. Lungs are clear to  auscultation, no crackles or wheezes. Heart:  normal rate and regular rhythm.   Abdomen:  soft and non-tender.   Msk:  normal ROM and no joint tenderness.   Neurologic:  cranial nerves II-XII intact and gait normal.   Skin:  turgor normal and color normal.     Impression & Recommendations:  Problem # 1:  ANXIETY STATE, UNSPECIFIED (ICD-300.00) Assessment Improved  doing well on meds His updated medication list for this problem includes:    Paxil 20 Mg Tabs (Paroxetine hcl) .[name] Kitchen... Take 1 tablet by mouth once a day .   Problem # 2:  HYPERLIPIDEMIA (ICD-272.4) controlled continue current medications  His updated medication list for this problem includes:    Simvastatin 40 Mg Tabs (Simvastatin) .[name] Kitchen... Take one tablet at bedtime  Labs Reviewed: SGOT: 28 (07/01/2009)   SGPT: 39 (07/01/2009)   HDL:39.30 (07/01/2009), 42.70 (03/24/2009)  LDL:92 (07/01/2009), 91 (12/16/2008)  Chol:158 (07/01/2009), 138 (03/24/2009)  Trig:136.0 (07/01/2009), 220.0 (03/24/2009)  Problem # 3:  BACK PAIN (ICD-724.5) resolved remove from problem list His updated medication list for this problem includes:    Butalbital-apap-caffeine 50-325-40 Mg Caps (Butalbital-apap-caffeine) .[name] Kitchen... Take 1 tablet by mouth once a day as needed  Problem # 4:  GERD (ICD-530.81) well controlled continue current medications  His updated medication list for this problem includes:    Nexium 40 Mg Cpdr (Esomeprazole magnesium) .[name] Kitchen... Take 1 tablet by mouth two times a day  Complete Medication List: 1)  Fexofenadine Hcl 180 Mg Tabs (Fexofenadine hcl) .... Once daily 2)  Fluticasone Propionate 50 Mcg/act Susp (Fluticasone propionate) .... One puff as directed once daily 3)  Propranolol-hctz 40-25 Mg Tabs (Propranolol-hctz) .... Take 1 tablet by mouth twice a day 4)  Detrol La 4 Mg Xr24h-cap (Tolterodine tartrate) .... Take 1 tablet by mouth once a day 5)  Simvastatin 40 Mg Tabs (Simvastatin) .... Take one tablet at bedtime 6)   Proair Hfa 108 (90 Base) Mcg/act Aers (Albuterol sulfate) .... 2 inhalations qid as directed 7)  Butalbital-apap-caffeine 50-325-40 Mg Caps (Butalbital-apap-caffeine) .... Take 1 tablet by mouth once a day as needed 8)  Azelastine Hcl 137 Mcg/spray Soln (Azelastine hcl) .... 2 puffs each nostril daily 9)  Nexium 40 Mg Cpdr (Esomeprazole magnesium) .... Take 1 tablet by mouth two times a day 10)  Ondansetron Hcl 4 Mg Tabs (Ondansetron hcl) .... Take one tablet by mouth every other day as needed 11)  Promethazine Hcl 25 Mg Tabs (Promethazine hcl) .... Take one tablet by mouth every other day as needed nausea 12)  Paxil 20 Mg Tabs (Paroxetine hcl) .... Take 1 tablet by mouth once a day 13)  Domperidone 10mg  Tabs  .... Take one by mouth 30 minutes before breakfast, lunch and dinner.  Patient Instructions: 1)  Please schedule a follow-up appointment in 6 months. 2)  lipids 272.4 3)  liver 995.2 4)  bmet-995.2

## 2010-03-30 NOTE — Medication Information (Signed)
Summary: Possible Nonadherence with Simvastatin/[facility]  Possible Nonadherence with Simvastatin/[facility]   Imported By: Lanelle Bal 04/23/2009 09:56:58  _____________________________________________________________________  External Attachment:    Type:   Image     Comment:   External Document

## 2010-03-31 ENCOUNTER — Other Ambulatory Visit (INDEPENDENT_AMBULATORY_CARE_PROVIDER_SITE_OTHER): Payer: 59 | Admitting: Internal Medicine

## 2010-03-31 ENCOUNTER — Ambulatory Visit: Admit: 2010-03-31 | Payer: 59 | Admitting: Internal Medicine

## 2010-03-31 DIAGNOSIS — E785 Hyperlipidemia, unspecified: Secondary | ICD-10-CM

## 2010-03-31 DIAGNOSIS — T887XXA Unspecified adverse effect of drug or medicament, initial encounter: Secondary | ICD-10-CM

## 2010-03-31 DIAGNOSIS — E7800 Pure hypercholesterolemia, unspecified: Secondary | ICD-10-CM

## 2010-03-31 DIAGNOSIS — I10 Essential (primary) hypertension: Secondary | ICD-10-CM

## 2010-03-31 LAB — HEPATIC FUNCTION PANEL
Albumin: 4 g/dL (ref 3.5–5.2)
Alkaline Phosphatase: 77 U/L (ref 39–117)
Bilirubin, Direct: 0.1 mg/dL (ref 0.0–0.3)
Total Protein: 6.7 g/dL (ref 6.0–8.3)

## 2010-03-31 LAB — BASIC METABOLIC PANEL
CO2: 32 mEq/L (ref 19–32)
Chloride: 99 mEq/L (ref 96–112)
Creatinine, Ser: 1 mg/dL (ref 0.4–1.5)
GFR: 98.51 mL/min (ref >60.00)
Glucose, Bld: 86 mg/dL (ref 70–99)
Sodium: 140 mEq/L (ref 135–145)

## 2010-03-31 LAB — LIPID PANEL
Cholesterol: 177 mg/dL (ref 0–200)
Triglycerides: 316 mg/dL — ABNORMAL HIGH (ref 0.0–149.0)

## 2010-04-01 NOTE — Progress Notes (Signed)
Summary: Medication  Phone Note Call from Patient Call back at Home Phone [phone]   Caller: Patient Call For: Dr. Juanda Chance Reason for Call: Talk to Nurse Summary of Call: Needs refill on zofran with 30 pills and not just 20 pills, [facility] Initial call taken by: [place] Garrels,  February 05, 2010 3:19 PM  Follow-up for Phone Call        Message left for patient to call back.Jesse Fall RN  February 05, 2010 3:33 PM  Dr Juanda Chance, I have a refill request for patient's Zofran. In EMR, at this time, it says patient is to take Zofran every other day AS NEEDED. However, his refill request indicates that patient takes Zofran every 6 hours.[name] KitchenMarland KitchenMarland KitchenHow is he to take it? Follow-up by: Lamona Curl CMA Duncan Dull),  February 08, 2010 8:48 AM  Additional Follow-up for Phone Call Additional follow up Details #1::        1 by mouth every 6-8 hrs as needed severe nausea. He should not be taking it all the time. #20/ month is appropriate. Additional Follow-up by: Hart Carwin MD,  February 08, 2010 12:54 PM    Additional Follow-up for Phone Call Additional follow up Details #2::    I have spoken with the patient and have advised him that we can give him MAX 20 tablets monthly and that if he is taking more than that, he should definately cut back. He is only to be taking the medication AS NEEDED FOR SEVERE nausea. Patient verbalizes understanding. Follow-up by: Lamona Curl CMA (AAMA),  February 08, 2010 1:40 PM

## 2010-04-08 ENCOUNTER — Ambulatory Visit (INDEPENDENT_AMBULATORY_CARE_PROVIDER_SITE_OTHER): Payer: PRIVATE HEALTH INSURANCE | Admitting: Internal Medicine

## 2010-04-08 ENCOUNTER — Encounter: Payer: Self-pay | Admitting: Internal Medicine

## 2010-04-08 DIAGNOSIS — K3184 Gastroparesis: Secondary | ICD-10-CM

## 2010-04-08 DIAGNOSIS — K219 Gastro-esophageal reflux disease without esophagitis: Secondary | ICD-10-CM

## 2010-04-08 DIAGNOSIS — I10 Essential (primary) hypertension: Secondary | ICD-10-CM

## 2010-04-08 DIAGNOSIS — Z8546 Personal history of malignant neoplasm of prostate: Secondary | ICD-10-CM

## 2010-04-08 DIAGNOSIS — R51 Headache: Secondary | ICD-10-CM

## 2010-04-08 DIAGNOSIS — E785 Hyperlipidemia, unspecified: Secondary | ICD-10-CM

## 2010-04-08 DIAGNOSIS — F411 Generalized anxiety disorder: Secondary | ICD-10-CM

## 2010-04-08 NOTE — Assessment & Plan Note (Signed)
Tolerating meds Reviewed labs He does admit to not taking meds as regularly as he should

## 2010-04-08 NOTE — Progress Notes (Signed)
HPI  patient comes in for followup. He has known hyperlipidemia, hypertension , gastroesophageal reflux disease and presumed gastroparesis. He has intermittent nausea followed by gastroenterology. He denies any chest pain, shortness of breath, PND orthopnea or any other specific complaints. ROS:  patient denies chest pain, shortness of breath, orthopnea. Denies lower extremity edema, abdominal pain, change in appetite, change in bowel movements. Patient denies rashes, musculoskeletal complaints. No other specific complaints in a complete review of systems.    EXAM:   well-developed well-nourished male in no acute distress. HEENT exam atraumatic, normocephalic, neck supple without jugular venous distention. Chest clear to auscultation cardiac exam S1-S2 are regular. Abdominal exam overweight with bowel sounds, soft and nontender. Extremities no edema. Neurologic exam is alert with a normal gait.

## 2010-04-08 NOTE — Assessment & Plan Note (Signed)
Has continued f/u with urology

## 2010-04-08 NOTE — Assessment & Plan Note (Signed)
Has occasional headaches. Takes meds 2 times per week or so

## 2010-04-08 NOTE — Assessment & Plan Note (Signed)
On paxil from Dr. Juanda Chance (anxiety thought to be related to chronic GI sxs)

## 2010-04-08 NOTE — Assessment & Plan Note (Signed)
sxs well controlled on nexium

## 2010-04-10 ENCOUNTER — Encounter: Payer: Self-pay | Admitting: Internal Medicine

## 2010-04-10 NOTE — Assessment & Plan Note (Signed)
Reasonable control. Continue current medications. 

## 2010-04-10 NOTE — Assessment & Plan Note (Signed)
This is the presumed most likely cause of his ongoing nausea.

## 2010-05-03 ENCOUNTER — Ambulatory Visit (INDEPENDENT_AMBULATORY_CARE_PROVIDER_SITE_OTHER): Payer: 59 | Admitting: Internal Medicine

## 2010-05-03 ENCOUNTER — Encounter: Payer: Self-pay | Admitting: Internal Medicine

## 2010-05-03 DIAGNOSIS — K3184 Gastroparesis: Secondary | ICD-10-CM

## 2010-05-05 ENCOUNTER — Other Ambulatory Visit: Payer: Self-pay | Admitting: Internal Medicine

## 2010-05-11 NOTE — Assessment & Plan Note (Signed)
Summary: f.u ...sch w pt ..cx fee advised   History of Present Illness Visit Type: Follow-up Visit Primary GI MD: Lina Sar MD Primary Provider: Birdie Sons, MD Requesting Provider: n/a Chief Complaint: F/u for nausea. Pt states that he is taking medicine as prescribed but still having nausea  History of Present Illness:   This is a 60 year old African American male with gastroparesis and gastroesophageal reflux disease. His last appointment was in October 2011. He has chronic nausea and  functional dyspepsia . He  has been improved on Paxil 20 mg daily and domperidone 10 mg 3 times a day. His gastric emptying scan showed a 36% retention in 2 hours. His last upper endoscopy in February 2011 showed gastritis and esophagitis. A CT scan of the abdomen was completed in February 2011and showed him to be status post prostatectomy. A HIDA scan showed 65% ejection fraction. Patient's last colonoscopy in 2004 was negative. He does need refills of his medications.   GI Review of Systems      Denies abdominal pain, acid reflux, belching, bloating, chest pain, dysphagia with liquids, dysphagia with solids, heartburn, loss of appetite, nausea, vomiting, vomiting blood, weight loss, and  weight gain.        Denies anal fissure, black tarry stools, change in bowel habit, constipation, diarrhea, diverticulosis, fecal incontinence, heme positive stool, hemorrhoids, irritable bowel syndrome, jaundice, light color stool, liver problems, rectal bleeding, and  rectal pain.    Current Medications (verified): 1)  Fexofenadine Hcl 180 Mg Tabs (Fexofenadine Hcl) .... Once Daily 2)  Fluticasone Propionate 50 Mcg/act  Susp (Fluticasone Propionate) .... One Puff As Directed Once Daily 3)  Propranolol-Hctz 40-25 Mg Tabs (Propranolol-Hctz) .... Take 1 Tablet By Mouth Twice A Day 4)  Detrol La 4 Mg  Xr24h-Cap (Tolterodine Tartrate) .... Take 1 Tablet By Mouth Once A Day 5)  Simvastatin 40 Mg Tabs (Simvastatin) ....  Take One Tablet At Bedtime 6)  Proair Hfa 108 (90 Base) Mcg/act Aers (Albuterol Sulfate) .... 2 Inhalations Qid As Directed 7)  Butalbital-Apap-Caffeine 50-325-40 Mg Caps (Butalbital-Apap-Caffeine) .... Take 1 Tablet By Mouth Once A Day As Needed 8)  Azelastine Hcl 137 Mcg/spray Soln (Azelastine Hcl) .... 2 Puffs Each Nostril Daily 9)  Nexium 40 Mg Cpdr (Esomeprazole Magnesium) .... Take 1 Tablet By Mouth Two Times A Day 10)  Ondansetron Hcl 4 Mg Tabs (Ondansetron Hcl) .... Take One Tablet By Mouth Every Other Day As Needed 11)  Promethazine Hcl 25 Mg Tabs (Promethazine Hcl) .... Take One Tablet By Mouth Every Other Day As Needed Nausea 12)  Paxil 20 Mg Tabs (Paroxetine Hcl) .... Take 1 Tablet By Mouth Once A Day 13)  Domperidone 10mg  Tabs .... Take One By Mouth 30 Minutes Before Breakfast, Lunch and Dinner.  Allergies (verified): 1)  Darvon  Past History:  Past Medical History: Prostate cancer, hx of Headache Hypertension elevated LFT's--Hep B & C neg elevated PSA Asthma, as child Hyperlipidemia Allergic rhinitis Gastroparesis Hiatal Hernia  GERD   Past Surgical History: Reviewed history from 04/02/2009 and no changes required. Prostatectomy 5/12/8 Appendectomy ROTATOR CUFF REPAIR  9/09  Family History: Reviewed history from 05/01/2009 and no changes required. Family History Hypertension-father, 2 brothers mother died age 59 pancreatic cancer No FH of Colon Cancer:  Social History: Reviewed history from 04/02/2009 and no changes required. Married 4 healthy children Occupation: [employer] Alcohol Use - no Illicit Drug Use - no  Review of Systems  The patient denies allergy/sinus, anemia, anxiety-new, arthritis/joint pain, back  pain, blood in urine, breast changes/lumps, change in vision, confusion, cough, coughing up blood, depression-new, fainting, fatigue, fever, headaches-new, hearing problems, heart murmur, heart rhythm changes, itching, menstrual pain, muscle  pains/cramps, night sweats, nosebleeds, pregnancy symptoms, shortness of breath, skin rash, sleeping problems, sore throat, swelling of feet/legs, swollen lymph glands, thirst - excessive , urination - excessive , urination changes/pain, urine leakage, vision changes, and voice change.         Pertinent positive and negative review of systems were noted in the above HPI. All other ROS was otherwise negative.   Vital Signs:  Patient profile:   60 year old male Height:      69.5 inches Weight:      209 pounds BMI:     30.53 BSA:     2.12 Pulse rate:   76 / minute Pulse rhythm:   regular BP sitting:   128 / 74  (left arm) Cuff size:   regular  Vitals Entered By: Ok Anis CMA (May 03, 2010 9:23 AM)  Physical Exam  General:  Well developed, well nourished, no acute distress. Eyes:  PERRLA, no icterus. Neck:  Supple; no masses or thyromegaly. Lungs:  Clear throughout to auscultation. Heart:  Regular rate and rhythm; no murmurs, rubs,  or bruits. Abdomen:  Soft, nontender and nondistended. No masses, hepatosplenomegaly or hernias noted. Normal bowel sounds. Extremities:  No clubbing, cyanosis, edema or deformities noted. Skin:  Intact without significant lesions or rashes. Psych:  Alert and cooperative. Normal mood and affect.   Impression & Recommendations:  Problem # 1:  GASTROPARESIS (ICD-536.3) Patient is currently stable. Nausea still exists but not as bad. Paxil helps. We will refill all his medications. He has been on a gastroparesis diet.  Problem # 2:  HIATAL HERNIA (ICD-553.3) Continue Nexium 40 mg twice a day.  Problem # 3:  ANXIETY STATE, UNSPECIFIED (ICD-300.00) Patient is to continue Paxil 20 mg daily.  Patient Instructions: 1)  refill on Domperidone, Phenergan and Zofran. 2)  Patient has refills on Paxil already. 3)  Continue Nexium 40 mg twice a day. 4)  FMLA form completed for another year. Form copied and sent to medical records to be scanned. 5)  Office  visit 6 months. 6)  Copy sent to : Dr B.Swords 7)  The medication list was reviewed and reconciled.  All changed / newly prescribed medications were explained.  A complete medication list was provided to the patient / caregiver. Prescriptions: PROMETHAZINE HCL 25 MG TABS (PROMETHAZINE HCL) Take one tablet by mouth every other day as needed nausea  #30 x 1   Entered by:   Lamona Curl CMA (AAMA)   Authorized by:   Hart Carwin MD   Signed by:   Lamona Curl CMA (AAMA) on 05/03/2010   Method used:   [facility] to        [facility] [phone]* (retail)       [address]       [place], [place]  [address]       Ph: [phone]       Fax: [phone]   RxID:   [id number] NEXIUM 40 MG CPDR (ESOMEPRAZOLE MAGNESIUM) Take 1 tablet by mouth two times a day  #180 x 0   Entered by:   Lamona Curl CMA (AAMA)   Authorized by:   Hart Carwin MD   Signed by:   Lamona Curl CMA (AAMA) on 05/03/2010  Method used:   Electronically to        [facility] [phone]* (retail)       [address]       [place], [place]  [address]       Ph: [phone]       Fax: [phone]   RxID:   [id number] ONDANSETRON HCL 4 MG TABS (ONDANSETRON HCL) Take one tablet by mouth every other day as needed  #20 x 2   Entered by:   Lamona Curl CMA (AAMA)   Authorized by:   Hart Carwin MD   Signed by:   Lamona Curl CMA (AAMA) on 05/03/2010   Method used:   Electronically to        [facility] [phone]* (retail)       [address]       [place], [place]  [address]       Ph: [phone]       Fax: [phone]   RxID:   [id number] DOMPERIDONE 10MG  TABS Take one by mouth 30 minutes before breakfast, lunch and dinner.  #90 x 1   Entered by:   Lamona Curl CMA (AAMA)   Authorized by:   Hart Carwin MD   Signed by:   Lamona Curl CMA (AAMA) on 05/03/2010   Method used:   Faxed to ...        [facility]* (retail)       [address]       [place], [place]  [address]       Ph: [phone]       Fax: [phone]   RxID:   [id number]

## 2010-05-21 ENCOUNTER — Telehealth: Payer: Self-pay | Admitting: Internal Medicine

## 2010-05-21 NOTE — Telephone Encounter (Signed)
Pt returning call. Pls call back at earliest convenience. Pt said that his vm cut off the call.

## 2010-05-24 NOTE — Telephone Encounter (Signed)
I don't see anything in the computer where we called pt.  Pt thought maybe dialed in error?  Will call back if needed

## 2010-07-16 NOTE — Discharge Summary (Signed)
Mark [name], ROTTENBERG NO.:  [id number]   MEDICAL RECORD NO.:  [id number]          PATIENT TYPE:  INP   LOCATION:  1423                         FACILITY[facility]   PHYSICIAN:  Heloise Purpura, MD      DATE OF BIRTH:  12-Oct-1950   DATE OF ADMISSION:  07/10/2006  DATE OF DISCHARGE:  07/12/2006                               DISCHARGE SUMMARY   ADMISSION DIAGNOSIS:  Prostate cancer.   DISCHARGE DIAGNOSIS:  Prostate cancer.   HISTORY:  Mr. Cuartas is a 60 year old gentleman with clinically  localized adenocarcinoma of the prostate.  He was counseled regarding  management options and after discussion, elected to proceed with  surgical therapy and a robotic-assisted laparoscopic radical  prostatectomy.   HOSPITAL COURSE:  On Jul 10, 2006, the patient was taken to the  operating room and underwent a robotic-assisted laparoscopic radical  prostatectomy and bilateral pelvic lymphadenectomy.  He tolerated this  procedure well without complications.  Postoperatively, he was able to  be transferred to a regular hospital room.  He had some difficulty  ambulating on the evening of postoperative day #1 and did have some  nausea.  In addition, he was noted to have borderline low blood pressure  readings.  Therefore, on postoperative day #1, his hemoglobin was  checked and was found to be 10.6, down from 13.  To exclude any concern  for bleeding, a hemoglobin was rechecked 6 hours later and was stable at  10.9.  Therefore, his decreasing hemoglobin was most likely due to  hemodilution.  He did use a fairly large amount of narcotic pain  medication which likely explained his relative hypotension.  His blood  pressure improved on postoperative day #1 and he was able to begin  ambulating more vigorously.  He was monitored overnight on postoperative  day #1 and did appear to tolerate clear liquid diet well without  difficulty.  By postoperative day #2, he maintained excellent urine  output from his catheter with minimal output from his pelvic drain and  his pelvic drain was removed.  He remained hemodynamically stable and  was able to be discharged home in excellent condition.   DISPOSITION:  Home.   DISCHARGE MEDICATIONS:  The patient was instructed to resume his regular  home medications excepting any aspirin, nonsteroidal anti-inflammatory  drugs, or herbal supplements for a period of 10 days.  He was given a  prescription to take Vicodin as needed for pain, Colace as a stool  softener, and to begin Cipro 1 day prior to his return visit for Foley  catheter removal.   DISCHARGE INSTRUCTIONS:  The patient was instructed be ambulatory, but  specifically told to refrain from any heavy lifting, strenuous activity,  or driving.  He was told to gradually advance his diet once passing  flatus regularly.  In addition, he was instructed on routine Foley  catheter care.   FOLLOW UP:  Mr. Dunklee will follow-up in 1 week for removal of his  Foley catheter and to discuss his surgical pathology in detail.           ______________________________  Konrad Dolores  Laverle Patter, MD  Electronically Signed     LB/MEDQ  D:  07/12/2006  T:  07/13/2006  Job:  [id number]

## 2010-07-16 NOTE — H&P (Signed)
Mark [name], Mark [name] NO.:  [id number]   MEDICAL RECORD NO.:  [id number]          PATIENT TYPE:  INP   LOCATION:  NA                           FACILITY:  [facility]   PHYSICIAN:  Heloise Purpura, MD      DATE OF BIRTH:  1950-12-23   DATE OF ADMISSION:  07/10/2006  DATE OF DISCHARGE:                              HISTORY & PHYSICAL   CHIEF COMPLAINT:  Prostate cancer.   HISTORY:  Mr. Cheatwood is a 60 year old gentleman with clinical stage T1c  prostate cancer with a PSA of 5.86 and Gleason score of 3 + 4 = 7.  He  underwent a discussion regarding the various management options for  clinically localized prostate cancer and elected to proceed with  surgical therapy.   PAST MEDICAL HISTORY:  1. Hypertension.  2. Asthma.   PAST SURGICAL HISTORY:  1. Appendectomy.  2. Surgery on his clavicle.   MEDICATIONS:  1. Propranolol/hydrochlorothiazide.  2. Fluticasone.  3. Albuterol.  4. Allegra.   ALLERGIES:  THE PATIENT HAS A QUESTIONABLE ALLERGY TO DARVOCET.   FAMILY HISTORY:  No history of prostate cancer or GU malignancy.   SOCIAL HISTORY:  The patient is married.  He quit smoking 30 years ago.  He denies alcohol use.   REVIEW OF SYSTEMS:  A complete review of systems was performed.  All  systems are reviewed and are negative.   PHYSICAL EXAMINATION:  CONSTITUTIONAL:  Well-nourished, well-developed,  age-appropriate male in no acute distress.  CARDIOVASCULAR:  Regular rate and rhythm without obvious murmurs.  LUNGS:  Clear bilaterally.  ABDOMEN:  The patient does have a well-healed right lower quadrant  appendectomy scar.  The abdomen is otherwise soft, nontender,  nondistended, and without abdominal masses.  DRE:  No evidence of nodularity or induration.   IMPRESSION:  Clinically localized adenocarcinoma of the prostate.   PLAN:  Mr. Frazzini will undergo a robotic-assisted laparoscopic radical  prostatectomy and bilateral pelvic lymphadenectomy.  He will then  be  admitted to the hospital for routine postoperative care.           ______________________________  Heloise Purpura, MD  Electronically Signed     LB/MEDQ  D:  07/10/2006  T:  07/10/2006  Job:  [id number]

## 2010-07-16 NOTE — Op Note (Signed)
NAMEALMIN, LIVINGSTONE NO.:  [id number]   MEDICAL RECORD NO.:  [id number]          PATIENT TYPE:  INP   LOCATION:  X003                         FACILITY:  [facility]   PHYSICIAN:  Heloise Purpura, MD      DATE OF BIRTH:  06/08/1950   DATE OF PROCEDURE:  07/10/2006  DATE OF DISCHARGE:                               OPERATIVE REPORT   PREOPERATIVE DIAGNOSIS:  Clinically localized adenocarcinoma of  prostate.   POSTOPERATIVE DIAGNOSIS:  Clinically localized adenocarcinoma of  prostate.   PROCEDURE:  1. Robotic assisted laparoscopic radical prostatectomy (bilateral      nerve sparing).  2. Bilateral pelvic lymphadenectomy.   SURGEON:  Heloise Purpura, MD   ASSISTANT:  Terie Purser, MD   ANESTHESIA:  General.   COMPLICATIONS:  None.   ESTIMATED BLOOD LOSS:  150 mL.   INTRAVENOUS FLUIDS:  2000 mL of lactated Ringer's.   SPECIMEN:  1. Prostate and seminal vesicles.  2. Right pelvic lymph nodes.  3. Left pelvic lymph nodes.   DISPOSITION OF PATHOLOGY:  To pathology.   DRAINS:  1. A 20-French Coude catheter.  2. A #19 Blake pelvic drain.   INDICATIONS:  Mr. Schnabel is a 60 year old gentleman with clinical stage  T1C prostate cancer with a PSA of 5.86 and Gleason score of 3 + 4 equals  7.  After discussing management options for clinically localized  prostate cancer, the patient elected to proceed with surgical therapy.  The potential risks and benefits were discussed with the patient; and an  informed consent was obtained.   DESCRIPTION OF PROCEDURE:  The patient was taken to the operating room  and a general anesthetic was administered.  He was given preoperative  antibiotics, placed in the dorsal lithotomy position, and prepped and  draped in the usual sterile fashion.   Next, a preoperative time-out was performed.  A site was selected just  to the left of the umbilicus for placement of the camera port.  This was  placed using a standard open Hassan  technique.  This allowed entry into  the peritoneal cavity under direct vision and without difficulty.  The  patient was noted to have some omental adhesions superior to the  incision on palpation.  A 12-mm port was then placed; and a  pneumoperitoneum was established.  The 0-degree lens was then used to  inspect the abdomen; and there were some omental adhesions in the  midline superiorly as previously noted.  There was noted to be some  small bowel adhesed to the right lateral abdominal wall which was likely  a result of the patient's prior appendectomy.  These adhesions did not  appear to be in the way for placement of our ports.  The remaining ports  were then placed.   Bilateral 8-mm robotic ports were placed 10-cm lateral to the camera  port and just inferior to the camera port.  An additional 8-mm robotic  port was placed in the far left lateral abdominal wall.  A 5-mm port was  placed between the camera port and the right robotic port.  An  additional 12-mm port  was placed in the far right lateral abdominal wall  for laparoscopic assistance.  All ports were placed under direct vision,  and without difficulty.  The surgical cart was then docked.   With the aid of the cautery scissors, the bladder was reflected  posteriorly allowing entry into the space of Retzius and identification  of the endopelvic fascia and prostate.  The endopelvic fascia was then  incised from the apex back to the base of the prostate bilaterally.  The  underlying levator muscle fibers were swept laterally off the prostate,  thereby isolating the dorsal venous complex.  The dorsal venous complex  was then stapled and divided with a 45-mm flex ETS stapler.  The bladder  neck was then identify with the aid of Foley catheter manipulation.  The  anterior bladder neck was then incised; and the Foley catheter was  exposed.  The catheter balloon was deflated; and the catheter was  brought into the operative field  and used to retract the prostate  anteriorly.   The posterior bladder neck was then identified and divided.  Dissection  continued between the bladder neck and prostate until the vasa  deferentia and seminal vesicles were identified.  The vasa deferentia  were isolated, divided and lifted anteriorly.  The seminal vesicles were  dissected free down to their tips with care to control the seminal  vesicle arterial blood supply.  The seminal vesicles were then lifted  anteriorly, and the space between Denonvilliers fascia and the anterior  rectum was bluntly developed, thereby, isolating the vascular pedicles  of prostate.  The lateral prostatic fascia was then incised bilaterally  allowing the neurovascular bundles to be swept laterally and posteriorly  off the prostate.   The vascular pedicles of prostate were then ligated with Hem-o-lok clips  above the level of the neurovascular bundles; and divided with sharp  cold scissor dissection in an thermal technique.  The neurovascular  bundles were then swept off the apex of the prostate; and the urethra  was sharply divided allowing the prostate specimen to be disarticulated.  The pelvis was then copiously irrigated with irrigation.  There was no  evidence of a rectal injury and hemostasis was ensured.   Attention then turned the right pelvic sidewall.  The fibrofatty tissue  between the external iliac vein, confluence of the iliac vessels,  internal iliac artery, and Cooper's ligament was dissected free from the  pelvic sidewall with care to preserve the obturator nerve.  Hem-o-lok  clips were used for lymphostasis and hemostasis.  This lymphatic packet  was passed off for permanent pathologic analysis.  An identical  procedure was then performed on the contralateral side.   Attention then turned to the urethral anastomosis.  A 2-0 Vicryl slip- knot was placed at the 6 o'clock position between the bladder neck and  urethra to  reapproximate these structures.  A double-armed 3-0 Monocryl  suture was then used perform a 360-degree running tension-free  anastomosis between the bladder neck and urethra.  A new 20-French Coude  catheter was inserted into the bladder and irrigated.  The anastomosis  was watertight; and there were no blood clots within the bladder.  A #19  Blake drain was then brought through the left robotic port; and  appropriately positioned in the pelvis.  It was secured to skin with a  nylon suture.  The surgical cart was then undocked.   The right lateral 12-mm port site was then closed with a #0 Vicryl  stitch placed with the aid of the suture passer device.  The prostate  specimen was removed via the periumbilical port site within the  Endopouch retrieval bag.  This fascial opening was then closed with a  running #0 Vicryl suture.  All remaining ports were removed under direct  vision.  The ports were then injected with 1/4% Marcaine and  reapproximated at the skin level with staples.  Sterile dressings were  applied.  The patient appeared to tolerate the procedure well and  without difficulty.  He was able to be extubated and transferred to  recovery unit in satisfactory condition.           ______________________________  Heloise Purpura, MD  Electronically Signed     LB/MEDQ  D:  07/10/2006  T:  07/10/2006  Job:  [id number]

## 2010-07-21 ENCOUNTER — Other Ambulatory Visit: Payer: Self-pay | Admitting: Internal Medicine

## 2010-08-20 ENCOUNTER — Other Ambulatory Visit: Payer: Self-pay | Admitting: *Deleted

## 2010-08-20 MED ORDER — TOLTERODINE TARTRATE ER 4 MG PO CP24: 4.0000 mg | ORAL_CAPSULE | Freq: Every day | ORAL | Status: DC

## 2010-09-30 ENCOUNTER — Other Ambulatory Visit (INDEPENDENT_AMBULATORY_CARE_PROVIDER_SITE_OTHER): Payer: 59

## 2010-09-30 DIAGNOSIS — Z0000 Encounter for general adult medical examination without abnormal findings: Secondary | ICD-10-CM

## 2010-09-30 LAB — POCT URINALYSIS DIPSTICK
Blood, UA: NEGATIVE
Leukocytes, UA: NEGATIVE
pH, UA: 5.5

## 2010-09-30 LAB — LDL CHOLESTEROL, DIRECT: Direct LDL: 78.2 mg/dL

## 2010-09-30 LAB — BASIC METABOLIC PANEL
BUN: 16 mg/dL (ref 6–23)
CO2: 31 mEq/L (ref 19–32)
Chloride: 99 mEq/L (ref 96–112)
Creatinine, Ser: 0.9 mg/dL (ref 0.4–1.5)
Glucose, Bld: 93 mg/dL (ref 70–99)

## 2010-09-30 LAB — CBC WITH DIFFERENTIAL/PLATELET
Eosinophils Absolute: 0.1 10*3/uL (ref 0.0–0.7)
Lymphs Abs: 1.6 10*3/uL (ref 0.7–4.0)
MCHC: 33.5 g/dL (ref 30.0–36.0)
MCV: 95.6 fl (ref 78.0–100.0)
Monocytes Absolute: 0.7 10*3/uL (ref 0.1–1.0)
Neutrophils Relative %: 49.1 % (ref 43.0–77.0)
Platelets: 259 10*3/uL (ref 150.0–400.0)
RDW: 12.7 % (ref 11.5–14.6)

## 2010-09-30 LAB — LIPID PANEL
Cholesterol: 173 mg/dL (ref 0–200)
HDL: 42.3 mg/dL (ref >39.00)
Total CHOL/HDL Ratio: 4
Triglycerides: 247 mg/dL — ABNORMAL HIGH (ref 0.0–149.0)

## 2010-09-30 LAB — HEPATIC FUNCTION PANEL
Bilirubin, Direct: 0.1 mg/dL (ref 0.0–0.3)
Total Bilirubin: 0.8 mg/dL (ref 0.3–1.2)
Total Protein: 6.6 g/dL (ref 6.0–8.3)

## 2010-10-07 ENCOUNTER — Ambulatory Visit (INDEPENDENT_AMBULATORY_CARE_PROVIDER_SITE_OTHER): Payer: 59 | Admitting: Internal Medicine

## 2010-10-07 ENCOUNTER — Encounter: Payer: Self-pay | Admitting: Internal Medicine

## 2010-10-07 VITALS — BP 126/92 | HR 68 | Temp 98.1°F | Ht 69.0 in | Wt 201.0 lb

## 2010-10-07 DIAGNOSIS — Z8546 Personal history of malignant neoplasm of prostate: Secondary | ICD-10-CM

## 2010-10-07 DIAGNOSIS — Z0000 Encounter for general adult medical examination without abnormal findings: Secondary | ICD-10-CM

## 2010-10-07 NOTE — Progress Notes (Signed)
  Subjective:    Patient ID: Mark [name], male    DOB: Dec 04, 1950, 60 y.o.   MRN: [id number]  HPI  cpx   Past Medical History  Diagnosis Date  . HYPERTENSION 03/07/2007  . Headache 03/07/2007  . PROSTATE CANCER, HX OF 03/07/2007  . ASTHMA 03/12/2007  . HYPERLIPIDEMIA 06/11/2007  . GERD 04/02/2009  . HIATAL HERNIA 04/28/2009  . TRANSAMINASES, SERUM, ELEVATED 04/28/2009  . Gastroparesis 05/01/2009  . Anxiety state, unspecified 09/01/2009   Past Surgical History  Procedure Date  . Prostatectomy 07/10/06  . Appendectomy   . Rotator cuff repair     9/09    reports that he quit smoking about 27 years ago. His smoking use included Cigarettes. He does not have any smokeless tobacco history on file. He reports that he does not drink alcohol or use illicit drugs. family history includes Hypertension in his brothers and father and Pancreatic cancer in his mother.  There is no history of Colon cancer. Allergies  Allergen Reactions  . Propoxyphene Hcl     REACTION: nausea, vomiting    Review of Systems  patient denies chest pain, shortness of breath, orthopnea. Denies lower extremity edema, abdominal pain, change in appetite, change in bowel movements. Patient denies rashes, musculoskeletal complaints. No other specific complaints in a complete review of systems.      Objective:   Physical Exam  Well-developed well-nourished male in no acute distress. HEENT exam atraumatic, normocephalic, extraocular muscles are intact. Neck is supple. No jugular venous distention no thyromegaly. Chest clear to auscultation without increased work of breathing. Cardiac exam S1 and S2 are regular. Abdominal exam active bowel sounds, soft, nontender. Extremities no edema. Neurologic exam she is alert without any motor sensory deficits. Gait is normal.    Assessment & Plan:   Well visit -- health maint UTD

## 2010-10-07 NOTE — Assessment & Plan Note (Signed)
Lab Results  Component Value Date   PSA 0.13 09/30/2010   PSA 0.00* 07/01/2009   PSA 0.01* 06/04/2008    psa has increased some. I'll ask Dr. Laverle Patter for advice Pt states labs were checked 2 months ago by Dr. Laverle Patter. I'll get those results

## 2010-10-09 ENCOUNTER — Other Ambulatory Visit: Payer: Self-pay | Admitting: Internal Medicine

## 2010-10-11 ENCOUNTER — Other Ambulatory Visit: Payer: Self-pay | Admitting: *Deleted

## 2010-10-11 MED ORDER — BUTALBITAL-ASPIRIN-CAFFEINE 50-325-40 MG PO CAPS: 1.0000 | ORAL_CAPSULE | Freq: Every day | ORAL | Status: DC | PRN

## 2010-11-05 ENCOUNTER — Ambulatory Visit (INDEPENDENT_AMBULATORY_CARE_PROVIDER_SITE_OTHER): Payer: 59 | Admitting: Internal Medicine

## 2010-11-05 ENCOUNTER — Encounter: Payer: Self-pay | Admitting: Internal Medicine

## 2010-11-05 VITALS — BP 136/78 | HR 64 | Ht 71.0 in | Wt 205.0 lb

## 2010-11-05 DIAGNOSIS — K3189 Other diseases of stomach and duodenum: Secondary | ICD-10-CM

## 2010-11-05 DIAGNOSIS — R1013 Epigastric pain: Secondary | ICD-10-CM

## 2010-11-05 DIAGNOSIS — K3184 Gastroparesis: Secondary | ICD-10-CM

## 2010-11-05 MED ORDER — ESOMEPRAZOLE MAGNESIUM 40 MG PO CPDR: 40.0000 mg | DELAYED_RELEASE_CAPSULE | Freq: Every day | ORAL | Status: DC

## 2010-11-05 MED ORDER — PAROXETINE HCL 20 MG PO TABS: 20.0000 mg | ORAL_TABLET | Freq: Every day | ORAL | Status: DC

## 2010-11-05 MED ORDER — PROMETHAZINE HCL 25 MG PO TABS: 25.0000 mg | ORAL_TABLET | Freq: Four times a day (QID) | ORAL | Status: DC | PRN

## 2010-11-05 MED ORDER — AMBULATORY NON FORMULARY MEDICATION: Status: DC

## 2010-11-05 MED ORDER — ONDANSETRON HCL 4 MG PO TABS: 4.0000 mg | ORAL_TABLET | Freq: Three times a day (TID) | ORAL | Status: DC | PRN

## 2010-11-05 NOTE — Patient Instructions (Signed)
We have sent the following medications to your pharmacy for you to pick up at your convenience: Zofran Phenergan Nexium Domperidone Please follow up with Dr Juanda Chance in 6 months. CC: Dr Birdie Sons

## 2010-11-05 NOTE — Progress Notes (Signed)
Mark [name] Mar 25, 1950 MRN [id number]    History of Present Illness:  This is a 60 year old African American male with gastroparesis and chronic intermittent nausea. Her last office visit was in March 2012. He is doing very well on a combination of medications which includes droperidol 10 mg 3 times a day, Nexium 40 mg by mouth twice a day, Zofran 4 mg when necessary and Phenergan 25 mg when necessary. His last endoscopy in February 2011 showed mild gastritis and esophagitis. His last colonoscopy in 2004 was negative. A CT scan of the abdomen in February 2011 showed no active disease. His HIDA scan showed a 65% ejection fraction. A gastric emptying scan was consistent with mild gastroparesis with 36% retention in 2 hours. He has been much improved on Paxil 20 mg a day. He has been approved for [employer] program.   Past Medical History  Diagnosis Date  . HYPERTENSION 03/07/2007  . Headache 03/07/2007  . PROSTATE CANCER, HX OF 03/07/2007  . ASTHMA 03/12/2007  . HYPERLIPIDEMIA 06/11/2007  . GERD 04/02/2009  . HIATAL HERNIA 04/28/2009  . TRANSAMINASES, SERUM, ELEVATED 04/28/2009  . Gastroparesis 05/01/2009  . Anxiety state, unspecified 09/01/2009  . Hiatal hernia    Past Surgical History  Procedure Date  . Prostatectomy 07/10/06  . Appendectomy   . Rotator cuff repair     9/09    reports that he quit smoking about 27 years ago. His smoking use included Cigarettes. He has never used smokeless tobacco. He reports that he does not drink alcohol or use illicit drugs. family history includes Hypertension in his brother and father and Pancreatic cancer (age of onset:59) in his mother.  There is no history of Colon cancer. Allergies  Allergen Reactions  . Propoxyphene Hcl     REACTION: nausea, vomiting        Review of Systems: Denies dysphagia, odynophagia, shortness of breath or chest pain denies change in bowel habits The remainder of the 10  point ROS is negative except as outlined in  H&P   Physical Exam: General appearance  Well developed, in no distress. Eyes- non icteric. HEENT nontraumatic, normocephalic. Mouth no lesions, tongue papillated, no cheilosis. Neck supple without adenopathy, thyroid not enlarged, no carotid bruits, no JVD. Lungs Clear to auscultation bilaterally. Cor normal S1 normal S2, regular rhythm , no murmur,  quiet precordium. Abdomen soft, nontender abdomen with discomfort in epigastrium and subxiphoid area. Normoactive bowel sounds. No distention. Liver edge at costal margin. Rectal: Not done. Extremities no pedal edema. Skin no lesions. Neurological alert and oriented x 3. Psychological normal mood and affect.  Assessment and Plan:  Problem #1 Mild gastroparesis. He has functional dyspepsia under good control with a combination of medications as above. We will refill all of his medications. I will see him in 6 months.   11/05/2010 Lina Sar

## 2010-12-31 ENCOUNTER — Ambulatory Visit (INDEPENDENT_AMBULATORY_CARE_PROVIDER_SITE_OTHER): Payer: 59 | Admitting: Internal Medicine

## 2010-12-31 ENCOUNTER — Telehealth: Payer: Self-pay | Admitting: Speech Pathology

## 2010-12-31 DIAGNOSIS — Z23 Encounter for immunization: Secondary | ICD-10-CM

## 2010-12-31 NOTE — Telephone Encounter (Signed)
Karrie Meres from [facility] [place] just called and said this pt showed up at their office wanting a Tens unit. They have no record of this from [place] and I could not find any documentation about it.  Can you please look into this and call your patient, today.  Thanks, Camelia Eng

## 2011-01-02 ENCOUNTER — Other Ambulatory Visit: Payer: Self-pay | Admitting: Internal Medicine

## 2011-01-02 NOTE — Telephone Encounter (Signed)
i don't believe i have prescribed a TENS unit

## 2011-01-03 NOTE — Telephone Encounter (Signed)
LMTCB

## 2011-01-05 NOTE — Telephone Encounter (Signed)
Pt is wanting Dr Cato Mulligan to order the TENS unit.  Is this ok?

## 2011-01-05 NOTE — Telephone Encounter (Signed)
He needs to be evaluated by physical medicine and rehabilitation physician. [facility] to [facility]

## 2011-01-07 ENCOUNTER — Other Ambulatory Visit: Payer: Self-pay | Admitting: Internal Medicine

## 2011-01-07 ENCOUNTER — Other Ambulatory Visit (INDEPENDENT_AMBULATORY_CARE_PROVIDER_SITE_OTHER): Payer: 59

## 2011-01-07 DIAGNOSIS — Z8546 Personal history of malignant neoplasm of prostate: Secondary | ICD-10-CM

## 2011-01-07 MED ORDER — AMBULATORY NON FORMULARY MEDICATION: Status: DC

## 2011-01-07 NOTE — Telephone Encounter (Signed)
LMTCB

## 2011-01-07 NOTE — Telephone Encounter (Signed)
Patient has been advised that we have sent rx to [facility] in [place], [place] and is agreeable to this.

## 2011-02-22 ENCOUNTER — Other Ambulatory Visit: Payer: Self-pay | Admitting: Internal Medicine

## 2011-03-28 ENCOUNTER — Other Ambulatory Visit: Payer: Self-pay | Admitting: Internal Medicine

## 2011-05-11 ENCOUNTER — Encounter: Payer: Self-pay | Admitting: Internal Medicine

## 2011-05-11 ENCOUNTER — Ambulatory Visit (INDEPENDENT_AMBULATORY_CARE_PROVIDER_SITE_OTHER): Payer: 59 | Admitting: Internal Medicine

## 2011-05-11 ENCOUNTER — Other Ambulatory Visit (INDEPENDENT_AMBULATORY_CARE_PROVIDER_SITE_OTHER): Payer: 59

## 2011-05-11 VITALS — BP 120/80 | HR 68 | Ht 71.0 in | Wt 212.4 lb

## 2011-05-11 DIAGNOSIS — K219 Gastro-esophageal reflux disease without esophagitis: Secondary | ICD-10-CM

## 2011-05-11 DIAGNOSIS — K3184 Gastroparesis: Secondary | ICD-10-CM

## 2011-05-11 LAB — COMPREHENSIVE METABOLIC PANEL
ALT: 52 U/L (ref 0–53)
CO2: 34 mEq/L — ABNORMAL HIGH (ref 19–32)
Calcium: 9.1 mg/dL (ref 8.4–10.5)
Chloride: 100 mEq/L (ref 96–112)
GFR: 109.42 mL/min (ref >60.00)
Sodium: 139 mEq/L (ref 135–145)
Total Protein: 6.7 g/dL (ref 6.0–8.3)

## 2011-05-11 LAB — CBC WITH DIFFERENTIAL/PLATELET
Platelets: 237 10*3/uL (ref 150.0–400.0)
WBC: 3.9 10*3/uL — ABNORMAL LOW (ref 4.5–10.5)

## 2011-05-11 MED ORDER — ESOMEPRAZOLE MAGNESIUM 40 MG PO CPDR: 40.0000 mg | DELAYED_RELEASE_CAPSULE | Freq: Two times a day (BID) | ORAL | Status: DC

## 2011-05-11 MED ORDER — AZITHROMYCIN 250 MG PO TABS: ORAL_TABLET | ORAL | Status: AC

## 2011-05-11 MED ORDER — AMBULATORY NON FORMULARY MEDICATION: Status: DC

## 2011-05-11 NOTE — Patient Instructions (Signed)
Your physician has requested that you go to the basement for the following lab work before leaving today: CBC, CMET We have sent the following medications to your pharmacy for you to pick up at your convenience: Nexium ZPak We have sent a prescription for domperidone to [facility]. CC: Dr Birdie Sons

## 2011-05-11 NOTE — Progress Notes (Signed)
Mark [name] 1950/08/31 MRN [id number]    History of Present Illness:  This is a 61 year old African American male with gastroparesis, chronic nausea and gastroesophageal reflux which is currently well controlled on domperidone and Nexium. His last office visit was in September 2012. He takes Paxil 20 mg a day for anxiety as well as Zofran and Phenergan on an as necessary basis. His last upper endoscopy in February 2011 showed mild gastritis and esophagitis. His H. pylori test was negative. His last colonoscopy was in 2004. A recall will be due next year. A CT scan of the abdomen in February 2011 showed no active disease. He has 11 mm cyst in the right kidney and pelvic calcifications from radiation. His gastric emptying scan was positive in February 2011 showing 36% retention in 2 hours. His HIDA scan was normal at 65% ejection fraction in 2011.   Past Medical History  Diagnosis Date  . HYPERTENSION 03/07/2007  . Headache 03/07/2007  . PROSTATE CANCER, HX OF 03/07/2007  . ASTHMA 03/12/2007  . HYPERLIPIDEMIA 06/11/2007  . GERD 04/02/2009  . HIATAL HERNIA 04/28/2009  . TRANSAMINASES, SERUM, ELEVATED 04/28/2009  . Gastroparesis 05/01/2009  . Anxiety state, unspecified 09/01/2009  . Hiatal hernia    Past Surgical History  Procedure Date  . Prostatectomy 07/10/06  . Appendectomy   . Rotator cuff repair     9/09    reports that he quit smoking about 28 years ago. His smoking use included Cigarettes. He has never used smokeless tobacco. He reports that he does not drink alcohol or use illicit drugs. family history includes Hypertension in his brother and father and Pancreatic cancer (age of onset:59) in his mother.  There is no history of Colon cancer. Allergies  Allergen Reactions  . Propoxyphene Hcl     REACTION: nausea, vomiting        Review of Systems: Positive for occasional reflux. Negative for dysphagia. Negative for change in bowel habits  The remainder of the 10 point ROS is negative  except as outlined in H&P   Physical Exam: General appearance  Well developed, in no distress. Eyes- non icteric. HEENT nontraumatic, normocephalic. Frequent cough Mouth no lesions, tongue papillated, no cheilosis. Neck supple without adenopathy, thyroid not enlarged, no carotid bruits, no JVD. Lungs Clear to auscultation bilaterally. No wheezes or rales Cor normal S1, normal S2, regular rhythm, no murmur,  quiet precordium. Abdomen: Soft nontender abdomen but minimal discomfort in epigastrium. Normal active bowel sounds. Liver edge at costal margin. Well-healed surgical scar. Rectal: Not done Extremities no pedal edema. Skin no lesions. Neurological alert and oriented x 3. Psychological normal mood and affect.  Assessment and Plan:  Problems #1 gastroparesis. Patient is doing well on domperidone 3 times a day and Nexium 40 mg once or twice a day. He is on a gastroparesis diet but has gained about 12 pounds. We discussed dietary modifications and more exercise. He will need some blood tests today and refill of medications.  Problems #2 colorectal rectal screening. He will be due for a recall colonoscopy in 2014.  Problem #3 upper respiratory infection. This is probably viral, but he has had a cough which is mostly productive. We will start him on a Z-Pak.  05/11/2011 Lina Sar

## 2011-05-25 ENCOUNTER — Encounter: Payer: Self-pay | Admitting: Family Medicine

## 2011-05-25 ENCOUNTER — Ambulatory Visit (INDEPENDENT_AMBULATORY_CARE_PROVIDER_SITE_OTHER): Payer: 59 | Admitting: Family Medicine

## 2011-05-25 VITALS — BP 138/90 | HR 93 | Temp 98.8°F | Wt 210.0 lb

## 2011-05-25 DIAGNOSIS — M76899 Other specified enthesopathies of unspecified lower limb, excluding foot: Secondary | ICD-10-CM

## 2011-05-25 DIAGNOSIS — M7060 Trochanteric bursitis, unspecified hip: Secondary | ICD-10-CM

## 2011-05-25 MED ORDER — METHYLPREDNISOLONE ACETATE 80 MG/ML IJ SUSP
120.0000 mg | Freq: Once | INTRAMUSCULAR | Status: AC
  Administered 2011-05-25: 120 mg via INTRAMUSCULAR

## 2011-05-25 MED ORDER — AMOXICILLIN-POT CLAVULANATE 875-125 MG PO TABS: 1.0000 | ORAL_TABLET | Freq: Two times a day (BID) | ORAL | Status: AC

## 2011-05-25 MED ORDER — DICLOFENAC SODIUM 75 MG PO TBEC: 75.0000 mg | DELAYED_RELEASE_TABLET | Freq: Two times a day (BID) | ORAL | Status: AC

## 2011-05-25 NOTE — Progress Notes (Signed)
Addended by: Azucena Freed on: 05/25/2011 11:44 AM   Modules accepted: Orders

## 2011-05-25 NOTE — Progress Notes (Signed)
  Subjective:    Patient ID: Mark [name], male    DOB: 1950-08-20, 61 y.o.   MRN: [id number]  HPI Here for 2 things. First for 3 weeks he has had intermittent sharp pains in the right hip after moving a heavy motor at work. No pain in the back or the leg. Advil helps a bit. Also for one month he has had sinus pressure, PND, ST, and a dry cough. Dr. Dickie La gave him a Zpack several weeks ago. This helped briefly.    Review of Systems  Constitutional: Negative.   HENT: Positive for congestion, postnasal drip and sinus pressure.   Eyes: Negative.   Respiratory: Positive for cough.   Musculoskeletal: Positive for arthralgias.       Objective:   Physical Exam  Constitutional: He appears well-developed and well-nourished.  HENT:  Right Ear: External ear normal.  Left Ear: External ear normal.  Nose: Nose normal.  Mouth/Throat: Oropharynx is clear and moist. No oropharyngeal exudate.  Eyes: Conjunctivae are normal.  Pulmonary/Chest: Effort normal and breath sounds normal.  Musculoskeletal:       Tender over the right lateral hip, not tender in the back. The hip shows full ROM  Lymphadenopathy:    He has no cervical adenopathy.          Assessment & Plan:  Rest, heat, Diclofenac prn. Given a steroid shot

## 2011-05-30 ENCOUNTER — Telehealth: Payer: Self-pay | Admitting: *Deleted

## 2011-05-30 HISTORY — PX: BACK SURGERY: SHX140

## 2011-05-30 NOTE — Telephone Encounter (Signed)
Pt called back to check on status of getting diff med since Dicolfenac isn't working. Pls call. [facility] [place].

## 2011-05-30 NOTE — Telephone Encounter (Signed)
Pt seen on 05/25/11 by Dr. Clent Ridges.

## 2011-05-30 NOTE — Telephone Encounter (Signed)
Left voice message. Dr. Clent Ridges is out of the office until next week and this is something that he will handle when he gets back.

## 2011-05-30 NOTE — Telephone Encounter (Signed)
Pt states the Diclofenac is not helping enough for his pain that Dr. Clent Ridges gave him, and needs something stronger.

## 2011-05-31 ENCOUNTER — Ambulatory Visit (INDEPENDENT_AMBULATORY_CARE_PROVIDER_SITE_OTHER)
Admission: RE | Admit: 2011-05-31 | Discharge: 2011-05-31 | Disposition: A | Discharge status: Home / Self Care | Payer: 59 | Source: Ambulatory Visit | Attending: Family | Admitting: Family

## 2011-05-31 ENCOUNTER — Ambulatory Visit (INDEPENDENT_AMBULATORY_CARE_PROVIDER_SITE_OTHER): Payer: 59 | Admitting: Family

## 2011-05-31 ENCOUNTER — Encounter: Payer: Self-pay | Admitting: Family

## 2011-05-31 VITALS — BP 130/74 | Temp 98.7°F | Wt 206.0 lb

## 2011-05-31 DIAGNOSIS — M25559 Pain in unspecified hip: Secondary | ICD-10-CM

## 2011-05-31 DIAGNOSIS — M76899 Other specified enthesopathies of unspecified lower limb, excluding foot: Secondary | ICD-10-CM

## 2011-05-31 DIAGNOSIS — M25551 Pain in right hip: Secondary | ICD-10-CM

## 2011-05-31 DIAGNOSIS — M7061 Trochanteric bursitis, right hip: Secondary | ICD-10-CM

## 2011-05-31 MED ORDER — HYDROCODONE-ACETAMINOPHEN 5-500 MG PO TABS: 1.0000 | ORAL_TABLET | Freq: Three times a day (TID) | ORAL | Status: AC | PRN

## 2011-05-31 MED ORDER — PREDNISONE 20 MG PO TABS: ORAL_TABLET | ORAL | Status: AC

## 2011-05-31 NOTE — Progress Notes (Signed)
Subjective:    Patient ID: Mark [name], male    DOB: May 30, 1950, 61 y.o.   MRN: [id number]  HPI 61 year old African American male who is in today with persistent right hip pain. He is a patient of Dr. Clent Ridges, saw him last week and was given diclofenac and a Depo-Medrol shot. For the first few days, patient reports his symptoms were much better. However, his symptoms have returned. He rates the pain as 7/10 at its worse with standing. He describes the pain as aching and sharp, and constant. Patient reports he continued to work hard, heavy lifting and bending since his diagnosis. He has been attempting to use a heating pad for his symptoms.     Review of Systems  Constitutional: Negative.   Respiratory: Negative.   Cardiovascular: Negative.   Gastrointestinal: Negative.   Musculoskeletal: Positive for arthralgias.       Right hip pain, and it radiates down his right thigh.  Skin: Negative.   Neurological: Negative.   Hematological: Negative.   Psychiatric/Behavioral: Negative.    Past Medical History  Diagnosis Date  . HYPERTENSION 03/07/2007  . Headache 03/07/2007  . PROSTATE CANCER, HX OF 03/07/2007  . ASTHMA 03/12/2007  . HYPERLIPIDEMIA 06/11/2007  . GERD 04/02/2009  . HIATAL HERNIA 04/28/2009  . TRANSAMINASES, SERUM, ELEVATED 04/28/2009  . Gastroparesis 05/01/2009  . Anxiety state, unspecified 09/01/2009  . Hiatal hernia     History   Social History  . Marital Status: Married    Spouse Name: N/A    Number of Children: N/A  . Years of Education: N/A   Occupational History  . Not on file.   Social History Main Topics  . Smoking status: Former Smoker    Types: Cigarettes    Quit date: 04/09/1983  . Smokeless tobacco: Never Used  . Alcohol Use: No  . Drug Use: No  . Sexually Active: Not on file   Other Topics Concern  . Not on file   Social History Narrative  . No narrative on file    Past Surgical History  Procedure Date  . Prostatectomy 07/10/06  . Appendectomy   .  Rotator cuff repair     9/09    Family History  Problem Relation Age of Onset  . Pancreatic cancer Mother 25  . Hypertension Father   . Hypertension Brother     x 2  . Colon cancer Neg Hx     Allergies  Allergen Reactions  . Propoxyphene Hcl     REACTION: nausea, vomiting    Current Outpatient Prescriptions on File Prior to Visit  Medication Sig Dispense Refill  . albuterol (PROAIR HFA) 108 (90 BASE) MCG/ACT inhaler Inhale 2 puffs into the lungs 4 (four) times daily as needed.        . AMBULATORY NON FORMULARY MEDICATION Medication Name: Domperidone 10 mg Take 1 tablet by mouth three times daily  270 tablet  1  . amoxicillin-clavulanate (AUGMENTIN) 875-125 MG per tablet Take 1 tablet by mouth 2 (two) times daily.  20 tablet  0  . azelastine (ASTELIN) 137 MCG/SPRAY nasal spray USE 2 PUFFS EACH NOSTRIL DAILY  1 mL  0  . butalbital-aspirin-caffeine (BUTALBITAL COMPOUND/ASA) 50-325-40 MG per capsule Take 1 capsule by mouth daily as needed.  30 capsule  2  . DETROL LA 4 MG 24 hr capsule TAKE 1 CAPSULE EVERY DAY  90 capsule  1  . diclofenac (VOLTAREN) 75 MG EC tablet Take 1 tablet (75 mg total) by mouth 2 (  two) times daily.  60 tablet  2  . esomeprazole (NEXIUM) 40 MG capsule Take 1 capsule (40 mg total) by mouth 2 (two) times daily.  180 capsule  1  . fluticasone (FLONASE) 50 MCG/ACT nasal spray 2 sprays by Nasal route daily.        . ondansetron (ZOFRAN) 4 MG tablet Take 1 tablet (4 mg total) by mouth every 8 (eight) hours as needed.  30 tablet  3  . PARoxetine (PAXIL) 20 MG tablet Take 1 tablet (20 mg total) by mouth at bedtime.  90 tablet  1  . promethazine (PHENERGAN) 25 MG tablet Take 1 tablet (25 mg total) by mouth every 6 (six) hours as needed for nausea. PRN for nausea  30 tablet  3  . propranolol-hydrochlorothiazide (INDERIDE) 40-25 MG per tablet TAKE 1 TABLET BY MOUTH TWICE A DAY  180 tablet  2  . simvastatin (ZOCOR) 40 MG tablet Take 40 mg by mouth at bedtime.        [name] Kitchen  DISCONTD: fexofenadine (ALLEGRA) 180 MG tablet Take 180 mg by mouth daily.          BP 130/74  Temp(Src) 98.7 F (37.1 C) (Oral)  Wt 206 lb (93.441 kg)chart    Objective:   Physical Exam  Constitutional: He is oriented to person, place, and time. He appears well-developed and well-nourished.  Neck: Normal range of motion. Neck supple.  Cardiovascular: Normal rate, regular rhythm and normal heart sounds.   Pulmonary/Chest: Effort normal and breath sounds normal.  Abdominal: Soft. Bowel sounds are normal.  Musculoskeletal:       Legs: Neurological: He is alert and oriented to person, place, and time.  Skin: Skin is warm and dry.  Psychiatric: He has a normal mood and affect.          Assessment & Plan:  Assessment: Right hip bursitis, right hip pain  Plan: Prednisone 60x3, 40x3, 20x3. Vicodin one tablet every 8 hours when necessary pain. X-ray of the right hip. Rest. Out of work x3 days. Patient to call the office if symptoms worsen or persist, recheck as scheduled and when necessary.

## 2011-05-31 NOTE — Telephone Encounter (Signed)
I spoke with pt and he is going to try and schedule with another provider here this week.

## 2011-05-31 NOTE — Patient Instructions (Signed)
Hip Bursitis Bursitis is a swelling and soreness (inflammation) of a fluid-filled sac (bursa). This sac overlies and protects the joints.  CAUSES   Injury.   Overuse of the muscles surrounding the joint.   Arthritis.   Gout.   Infection.   Cold weather.   Inadequate warm-up and conditioning prior to activities.  The cause may not be known.  SYMPTOMS   Mild to severe irritation.   Tenderness and swelling over the outside of the hip.   Pain with motion of the hip.   If the bursa becomes infected, a fever may be present. Redness, tenderness, and warmth will develop over the hip.  Symptoms usually lessen in 3 to 4 weeks with treatment, but can come back. TREATMENT If conservative treatment does not work, your caregiver may advise draining the bursa and injecting cortisone into the area. This may speed up the healing process. This may also be used as an initial treatment of choice. HOME CARE INSTRUCTIONS   Apply ice to the affected area for 15 to 20 minutes every 3 to 4 hours while awake for the first 2 days. Put the ice in a plastic bag and place a towel between the bag of ice and your skin.   Rest the painful joint as much as possible, but continue to put the joint through a normal range of motion at least 4 times per day. When the pain lessens, begin normal, slow movements and usual activities to help prevent stiffness of the hip.   Only take over-the-counter or prescription medicines for pain, discomfort, or fever as directed by your caregiver.   Use crutches to limit weight bearing on the hip joint, if advised.   Elevate your painful hip to reduce swelling. Use pillows for propping and cushioning your legs and hips.   Gentle massage may provide comfort and decrease swelling.  SEEK IMMEDIATE MEDICAL CARE IF:   Your pain increases even during treatment, or you are not improving.   You have a fever.   You have heat and inflammation over the involved bursa.   You have  any other questions or concerns.  MAKE SURE YOU:   Understand these instructions.   Will watch your condition.   Will get help right away if you are not doing well or get worse.  Document Released: 08/06/2001 Document Revised: 02/03/2011 Document Reviewed: 03/05/2008 Exit[employer] Patient Information 2012 [employer]. 

## 2011-06-04 ENCOUNTER — Other Ambulatory Visit: Payer: Self-pay | Admitting: Internal Medicine

## 2011-06-06 ENCOUNTER — Telehealth: Payer: Self-pay

## 2011-06-06 DIAGNOSIS — M25551 Pain in right hip: Secondary | ICD-10-CM

## 2011-06-06 NOTE — Telephone Encounter (Signed)
Message copied by Beverely Low on Mon Jun 06, 2011  4:18 PM ------      Message from: Adline Mango B      Created: Mon Jun 06, 2011  2:46 PM       I am aware of where his pain is. He could have referred pain. We can refer to ortho. He has seen me and his PCP for this issue. It may be time to see a specialist.

## 2011-06-06 NOTE — Telephone Encounter (Signed)
Pt would like the referral to ortho. Referral placed

## 2011-06-13 ENCOUNTER — Other Ambulatory Visit: Payer: Self-pay | Admitting: Orthopedic Surgery

## 2011-06-13 DIAGNOSIS — M47817 Spondylosis without myelopathy or radiculopathy, lumbosacral region: Secondary | ICD-10-CM

## 2011-06-22 ENCOUNTER — Ambulatory Visit
Admission: RE | Admit: 2011-06-22 | Discharge: 2011-06-22 | Disposition: A | Discharge status: Home / Self Care | Payer: 59 | Source: Ambulatory Visit | Attending: Orthopedic Surgery | Admitting: Orthopedic Surgery

## 2011-06-22 VITALS — BP 142/86 | HR 72

## 2011-06-22 DIAGNOSIS — M47817 Spondylosis without myelopathy or radiculopathy, lumbosacral region: Secondary | ICD-10-CM

## 2011-06-22 MED ORDER — ONDANSETRON HCL 4 MG/2ML IJ SOLN
4.0000 mg | Freq: Once | INTRAMUSCULAR | Status: AC
  Administered 2011-06-22: 4 mg via INTRAMUSCULAR

## 2011-06-22 MED ORDER — IOHEXOL 180 MG/ML  SOLN
20.0000 mL | Freq: Once | INTRAMUSCULAR | Status: AC | PRN
  Administered 2011-06-22: 20 mL via INTRATHECAL

## 2011-06-22 MED ORDER — DIAZEPAM 5 MG PO TABS
10.0000 mg | ORAL_TABLET | Freq: Once | ORAL | Status: AC
  Administered 2011-06-22: 10 mg via ORAL

## 2011-06-22 MED ORDER — ONDANSETRON HCL 4 MG/2ML IJ SOLN: 4.0000 mg | Freq: Four times a day (QID) | INTRAMUSCULAR | Status: DC | PRN

## 2011-06-22 MED ORDER — MEPERIDINE HCL 100 MG/ML IJ SOLN
75.0000 mg | Freq: Once | INTRAMUSCULAR | Status: AC
  Administered 2011-06-22: 75 mg via INTRAMUSCULAR

## 2011-06-22 NOTE — Discharge Instructions (Signed)
Myelogram Discharge Instructions  1. Go home and rest quietly for the next 24 hours.  It is important to lie flat for the next 24 hours.  Get up only to go to the restroom.  You may lie in the bed or on a couch on your back, your stomach, your left side or your right side.  You may have one pillow under your head.  You may have pillows between your knees while you are on your side or under your knees while you are on your back.  2. DO NOT drive today.  Recline the seat as far back as it will go, while still wearing your seat belt, on the way home.  3. You may get up to go to the bathroom as needed.  You may sit up for 10 minutes to eat.  You may resume your normal diet and medications unless otherwise indicated.  Drink lots of extra fluids today and tomorrow.  4. The incidence of headache, nausea, or vomiting is about 5% (one in 20 patients).  If you develop a headache, lie flat and drink plenty of fluids until the headache goes away.  Caffeinated beverages may be helpful.  If you develop severe nausea and vomiting or a headache that does not go away with flat bed rest, call [phone].  5. You may resume normal activities after your 24 hours of bed rest is over; however, do not exert yourself strongly or do any heavy lifting tomorrow. If when you get up you have a headache when standing, go back to bed and force fluids for another 24 hours.  6. Call your physician for a follow-up appointment.  The results of your myelogram will be sent directly to your physician by the following day.  7. If you have any questions or if complications develop after you arrive home, please call [phone].  Discharge instructions have been explained to the patient.  The patient, or the person responsible for the patient, fully understands these instructions.       May resume promethazine and paroxetine on June 23, 2011, after 9:30 am.

## 2011-06-22 NOTE — Progress Notes (Signed)
Pt has been off promethazine and paroxetine for the past 2 days.

## 2011-07-07 ENCOUNTER — Other Ambulatory Visit: Payer: Self-pay | Admitting: Orthopedic Surgery

## 2011-07-20 ENCOUNTER — Ambulatory Visit (HOSPITAL_COMMUNITY)
Admission: RE | Admit: 2011-07-20 | Discharge: 2011-07-20 | Disposition: A | Discharge status: Home / Self Care | Payer: 59 | Source: Ambulatory Visit | Attending: Orthopedic Surgery | Admitting: Orthopedic Surgery

## 2011-07-20 ENCOUNTER — Encounter (HOSPITAL_COMMUNITY): Payer: Self-pay | Admitting: Pharmacy Technician

## 2011-07-20 ENCOUNTER — Encounter (HOSPITAL_COMMUNITY): Payer: Self-pay

## 2011-07-20 ENCOUNTER — Encounter (HOSPITAL_COMMUNITY)
Admission: RE | Admit: 2011-07-20 | Discharge: 2011-07-20 | Disposition: A | Discharge status: Home / Self Care | Payer: 59 | Source: Ambulatory Visit | Attending: Orthopedic Surgery | Admitting: Orthopedic Surgery

## 2011-07-20 DIAGNOSIS — Z01818 Encounter for other preprocedural examination: Secondary | ICD-10-CM | POA: Insufficient documentation

## 2011-07-20 DIAGNOSIS — Z01812 Encounter for preprocedural laboratory examination: Secondary | ICD-10-CM | POA: Insufficient documentation

## 2011-07-20 LAB — BASIC METABOLIC PANEL
CO2: 29 mEq/L (ref 19–32)
Chloride: 98 mEq/L (ref 96–112)
Creatinine, Ser: 0.8 mg/dL (ref 0.50–1.35)
Sodium: 138 mEq/L (ref 135–145)

## 2011-07-20 LAB — CBC
MCV: 92.9 fL (ref 78.0–100.0)
Platelets: 256 10*3/uL (ref 150–400)
RBC: 4.8 MIL/uL (ref 4.22–5.81)
WBC: 5.8 10*3/uL (ref 4.0–10.5)

## 2011-07-20 NOTE — Pre-Procedure Instructions (Signed)
07-20-11 EKG/ CXR done today. Teach/ back method used with preop instructions.

## 2011-07-20 NOTE — Patient Instructions (Signed)
20 Mark [name]  07/20/2011   Your procedure is scheduled on:  5-29 -2013  Report to [facility] at        0530 AM.  Call this number if you have problems the morning of surgery: [phone]   Remember:   Do not eat food:After Midnight.    Bring nasal spray and inhalers to hospital.  Take these medicines the morning of surgery with A SIP OF WATER: Nexium, Zofran.      Propranolol 40mg (will give on arrival to Short Stay).   Do not wear jewelry, make-up or nail polish.  Do not wear lotions, powders, or perfumes. You may wear deodorant.  Do not shave 48 hours prior to surgery.(face and neck okay, no shaving of legs)  Do not bring valuables to the hospital.  Contacts, dentures or bridgework may not be worn into surgery.  Leave suitcase in the car. After surgery it may be brought to your room.  For patients admitted to the hospital, checkout time is 11:00 AM the day of discharge.   Patients discharged the day of surgery will not be allowed to drive home.  Name and phone number of your driver: spouse  Special Instructions: CHG Shower Use Special Wash: 1/2 bottle night before surgery and 1/2 bottle morning of surgery.(avoid face and genitals)   Please read over the following fact sheets that you were given: MRSA Information.

## 2011-07-21 NOTE — Pre-Procedure Instructions (Signed)
07/21/11 Patient returned call regarding positive pcr screen for staph.   Instructed pt to use Mupirocin ointment twice daily x 5 days.  Patient asked where he might obtain ointment for 5.00.  Instructed patient the location of the Outpatient Pharmacy at [facility] on [facility] [address] [facility] in the basement of the [facility] and the Outpatient Pharmacy at the corner of [address] and [facility].  Patient voiced understanding.

## 2011-07-27 ENCOUNTER — Ambulatory Visit (HOSPITAL_COMMUNITY): Payer: 59

## 2011-07-27 ENCOUNTER — Encounter (HOSPITAL_COMMUNITY): Payer: Self-pay | Admitting: *Deleted

## 2011-07-27 ENCOUNTER — Observation Stay (HOSPITAL_COMMUNITY)
Admission: RE | Admit: 2011-07-27 | Discharge: 2011-07-29 | Disposition: A | Discharge status: Home / Self Care | Payer: 59 | Source: Ambulatory Visit | Attending: Orthopedic Surgery | Admitting: Orthopedic Surgery

## 2011-07-27 ENCOUNTER — Ambulatory Visit (HOSPITAL_COMMUNITY): Payer: 59 | Admitting: *Deleted

## 2011-07-27 ENCOUNTER — Encounter (HOSPITAL_COMMUNITY): Payer: Self-pay

## 2011-07-27 ENCOUNTER — Encounter (HOSPITAL_COMMUNITY)
Admission: RE | Disposition: A | Discharge status: Home / Self Care | Payer: Self-pay | Source: Ambulatory Visit | Attending: Orthopedic Surgery

## 2011-07-27 DIAGNOSIS — M48061 Spinal stenosis, lumbar region without neurogenic claudication: Principal | ICD-10-CM | POA: Insufficient documentation

## 2011-07-27 DIAGNOSIS — Z9889 Other specified postprocedural states: Secondary | ICD-10-CM

## 2011-07-27 DIAGNOSIS — K219 Gastro-esophageal reflux disease without esophagitis: Secondary | ICD-10-CM | POA: Insufficient documentation

## 2011-07-27 DIAGNOSIS — M5126 Other intervertebral disc displacement, lumbar region: Secondary | ICD-10-CM | POA: Insufficient documentation

## 2011-07-27 DIAGNOSIS — M79609 Pain in unspecified limb: Secondary | ICD-10-CM | POA: Insufficient documentation

## 2011-07-27 DIAGNOSIS — I10 Essential (primary) hypertension: Secondary | ICD-10-CM | POA: Insufficient documentation

## 2011-07-27 DIAGNOSIS — M5137 Other intervertebral disc degeneration, lumbosacral region: Secondary | ICD-10-CM | POA: Insufficient documentation

## 2011-07-27 DIAGNOSIS — J45909 Unspecified asthma, uncomplicated: Secondary | ICD-10-CM | POA: Insufficient documentation

## 2011-07-27 DIAGNOSIS — E785 Hyperlipidemia, unspecified: Secondary | ICD-10-CM | POA: Insufficient documentation

## 2011-07-27 DIAGNOSIS — M129 Arthropathy, unspecified: Secondary | ICD-10-CM | POA: Insufficient documentation

## 2011-07-27 DIAGNOSIS — M51379 Other intervertebral disc degeneration, lumbosacral region without mention of lumbar back pain or lower extremity pain: Secondary | ICD-10-CM | POA: Insufficient documentation

## 2011-07-27 DIAGNOSIS — Z79899 Other long term (current) drug therapy: Secondary | ICD-10-CM | POA: Insufficient documentation

## 2011-07-27 DIAGNOSIS — M47817 Spondylosis without myelopathy or radiculopathy, lumbosacral region: Secondary | ICD-10-CM | POA: Insufficient documentation

## 2011-07-27 SURGERY — DECOMPRESSIVE LUMBAR LAMINECTOMY LEVEL 1
Anesthesia: General | Site: Back | Laterality: Right | Wound class: Clean

## 2011-07-27 MED ORDER — PANTOPRAZOLE SODIUM 40 MG PO TBEC
40.0000 mg | DELAYED_RELEASE_TABLET | Freq: Every day | ORAL | Status: DC
  Administered 2011-07-27 – 2011-07-29 (×3): 40 mg via ORAL
  Filled 2011-07-27 (×3): qty 1

## 2011-07-27 MED ORDER — ALBUTEROL SULFATE HFA 108 (90 BASE) MCG/ACT IN AERS
2.0000 | INHALATION_SPRAY | Freq: Four times a day (QID) | RESPIRATORY_TRACT | Status: DC | PRN
  Filled 2011-07-27: qty 6.7

## 2011-07-27 MED ORDER — THROMBIN 5000 UNITS EX SOLR
CUTANEOUS | Status: DC | PRN
  Administered 2011-07-27: 5000 [IU] via TOPICAL

## 2011-07-27 MED ORDER — OXYCODONE-ACETAMINOPHEN 5-325 MG PO TABS
1.0000 | ORAL_TABLET | ORAL | Status: DC | PRN
  Administered 2011-07-27: 1 via ORAL
  Administered 2011-07-27: 2 via ORAL
  Administered 2011-07-27: 1 via ORAL
  Administered 2011-07-28 – 2011-07-29 (×5): 2 via ORAL
  Filled 2011-07-27 (×2): qty 2
  Filled 2011-07-27: qty 1
  Filled 2011-07-27 (×3): qty 2
  Filled 2011-07-27: qty 1
  Filled 2011-07-27: qty 2

## 2011-07-27 MED ORDER — HYDROMORPHONE HCL PF 1 MG/ML IJ SOLN
INTRAMUSCULAR | Status: AC
  Filled 2011-07-27: qty 1

## 2011-07-27 MED ORDER — METOCLOPRAMIDE HCL 10 MG PO TABS: 5.0000 mg | ORAL_TABLET | Freq: Three times a day (TID) | ORAL | Status: DC | PRN

## 2011-07-27 MED ORDER — LIDOCAINE HCL (CARDIAC) 20 MG/ML IV SOLN
INTRAVENOUS | Status: DC | PRN
  Administered 2011-07-27: 75 mg via INTRAVENOUS

## 2011-07-27 MED ORDER — LACTATED RINGERS IV SOLN: INTRAVENOUS | Status: DC

## 2011-07-27 MED ORDER — CEFAZOLIN SODIUM-DEXTROSE 2-3 GM-% IV SOLR
2.0000 g | Freq: Four times a day (QID) | INTRAVENOUS | Status: AC
  Administered 2011-07-27 – 2011-07-28 (×3): 2 g via INTRAVENOUS
  Filled 2011-07-27 (×3): qty 50

## 2011-07-27 MED ORDER — PROMETHAZINE HCL 25 MG/ML IJ SOLN
6.2500 mg | INTRAMUSCULAR | Status: DC | PRN
  Administered 2011-07-27: 12.5 mg via INTRAVENOUS

## 2011-07-27 MED ORDER — PROPRANOLOL HCL 40 MG PO TABS
40.0000 mg | ORAL_TABLET | Freq: Once | ORAL | Status: AC
  Administered 2011-07-27: 40 mg via ORAL
  Filled 2011-07-27: qty 1

## 2011-07-27 MED ORDER — CEFAZOLIN SODIUM-DEXTROSE 2-3 GM-% IV SOLR
INTRAVENOUS | Status: AC
  Filled 2011-07-27: qty 50

## 2011-07-27 MED ORDER — LACTATED RINGERS IV SOLN
INTRAVENOUS | Status: DC | PRN
  Administered 2011-07-27 (×3): via INTRAVENOUS

## 2011-07-27 MED ORDER — FENTANYL CITRATE 0.05 MG/ML IJ SOLN
INTRAMUSCULAR | Status: DC | PRN
  Administered 2011-07-27 (×6): 50 ug via INTRAVENOUS

## 2011-07-27 MED ORDER — EPHEDRINE SULFATE 50 MG/ML IJ SOLN
INTRAMUSCULAR | Status: DC | PRN
  Administered 2011-07-27 (×3): 5 mg via INTRAVENOUS

## 2011-07-27 MED ORDER — CEFAZOLIN SODIUM 1-5 GM-% IV SOLN
INTRAVENOUS | Status: DC | PRN
  Administered 2011-07-27: 2 g via INTRAVENOUS

## 2011-07-27 MED ORDER — AZELASTINE HCL 0.1 % NA SOLN
1.0000 | Freq: Two times a day (BID) | NASAL | Status: DC
  Administered 2011-07-27 – 2011-07-29 (×5): 1 via NASAL
  Filled 2011-07-27 (×2): qty 30

## 2011-07-27 MED ORDER — HYDROMORPHONE HCL PF 1 MG/ML IJ SOLN
0.5000 mg | INTRAMUSCULAR | Status: DC | PRN
  Administered 2011-07-27 – 2011-07-28 (×4): 1 mg via INTRAVENOUS
  Filled 2011-07-27 (×4): qty 1

## 2011-07-27 MED ORDER — METHOCARBAMOL 100 MG/ML IJ SOLN
500.0000 mg | Freq: Four times a day (QID) | INTRAVENOUS | Status: DC | PRN
  Administered 2011-07-27: 500 mg via INTRAVENOUS
  Filled 2011-07-27: qty 5

## 2011-07-27 MED ORDER — HYDROCHLOROTHIAZIDE 25 MG PO TABS
25.0000 mg | ORAL_TABLET | Freq: Every day | ORAL | Status: DC
  Administered 2011-07-27 – 2011-07-29 (×2): 25 mg via ORAL
  Filled 2011-07-27 (×3): qty 1

## 2011-07-27 MED ORDER — KETOROLAC TROMETHAMINE 30 MG/ML IJ SOLN
15.0000 mg | Freq: Once | INTRAMUSCULAR | Status: AC | PRN
  Administered 2011-07-27: 30 mg via INTRAVENOUS

## 2011-07-27 MED ORDER — HYDROMORPHONE HCL PF 1 MG/ML IJ SOLN
0.2500 mg | INTRAMUSCULAR | Status: DC | PRN
  Administered 2011-07-27 (×5): 0.5 mg via INTRAVENOUS

## 2011-07-27 MED ORDER — CISATRACURIUM BESYLATE (PF) 10 MG/5ML IV SOLN
INTRAVENOUS | Status: DC | PRN
  Administered 2011-07-27: 2 mg via INTRAVENOUS
  Administered 2011-07-27: 8 mg via INTRAVENOUS

## 2011-07-27 MED ORDER — MIDAZOLAM HCL 5 MG/5ML IJ SOLN
INTRAMUSCULAR | Status: DC | PRN
  Administered 2011-07-27: 1 mg via INTRAVENOUS
  Administered 2011-07-27: 0.5 mg via INTRAVENOUS

## 2011-07-27 MED ORDER — PROPOFOL 10 MG/ML IV EMUL
INTRAVENOUS | Status: DC | PRN
  Administered 2011-07-27: 180 mg via INTRAVENOUS

## 2011-07-27 MED ORDER — SUCCINYLCHOLINE CHLORIDE 20 MG/ML IJ SOLN
INTRAMUSCULAR | Status: DC | PRN
  Administered 2011-07-27: 100 mg via INTRAVENOUS

## 2011-07-27 MED ORDER — PROPRANOLOL HCL 20 MG PO TABS
20.0000 mg | ORAL_TABLET | Freq: Two times a day (BID) | ORAL | Status: DC
  Administered 2011-07-27 – 2011-07-29 (×4): 20 mg via ORAL
  Filled 2011-07-27 (×6): qty 1

## 2011-07-27 MED ORDER — KETOROLAC TROMETHAMINE 30 MG/ML IJ SOLN
INTRAMUSCULAR | Status: AC
  Filled 2011-07-27: qty 1

## 2011-07-27 MED ORDER — PROMETHAZINE HCL 25 MG/ML IJ SOLN
INTRAMUSCULAR | Status: AC
  Filled 2011-07-27: qty 1

## 2011-07-27 MED ORDER — ONDANSETRON HCL 4 MG PO TABS: 4.0000 mg | ORAL_TABLET | Freq: Three times a day (TID) | ORAL | Status: DC | PRN

## 2011-07-27 MED ORDER — DEXAMETHASONE SODIUM PHOSPHATE 10 MG/ML IJ SOLN
INTRAMUSCULAR | Status: DC | PRN
  Administered 2011-07-27: 10 mg via INTRAVENOUS

## 2011-07-27 MED ORDER — METHOCARBAMOL 500 MG PO TABS
500.0000 mg | ORAL_TABLET | Freq: Four times a day (QID) | ORAL | Status: DC | PRN
  Administered 2011-07-27 – 2011-07-29 (×4): 500 mg via ORAL
  Filled 2011-07-27 (×4): qty 1

## 2011-07-27 MED ORDER — CEFAZOLIN SODIUM-DEXTROSE 2-3 GM-% IV SOLR: 2.0000 g | INTRAVENOUS | Status: DC

## 2011-07-27 MED ORDER — NEOSTIGMINE METHYLSULFATE 1 MG/ML IJ SOLN
INTRAMUSCULAR | Status: DC | PRN
  Administered 2011-07-27: 3 mg via INTRAVENOUS

## 2011-07-27 MED ORDER — LORATADINE 10 MG PO TABS
10.0000 mg | ORAL_TABLET | Freq: Every day | ORAL | Status: DC
  Administered 2011-07-27 – 2011-07-29 (×3): 10 mg via ORAL
  Filled 2011-07-27 (×3): qty 1

## 2011-07-27 MED ORDER — ONDANSETRON HCL 4 MG/2ML IJ SOLN
INTRAMUSCULAR | Status: DC | PRN
  Administered 2011-07-27 (×2): 2 mg via INTRAVENOUS

## 2011-07-27 MED ORDER — FESOTERODINE FUMARATE ER 4 MG PO TB24
4.0000 mg | ORAL_TABLET | Freq: Every day | ORAL | Status: DC
  Administered 2011-07-27 – 2011-07-29 (×3): 4 mg via ORAL
  Filled 2011-07-27 (×3): qty 1

## 2011-07-27 MED ORDER — SIMVASTATIN 40 MG PO TABS
40.0000 mg | ORAL_TABLET | Freq: Every day | ORAL | Status: DC
  Administered 2011-07-27 – 2011-07-28 (×2): 40 mg via ORAL
  Filled 2011-07-27 (×3): qty 1

## 2011-07-27 MED ORDER — POVIDONE-IODINE 7.5 % EX SOLN: Freq: Once | CUTANEOUS | Status: DC

## 2011-07-27 MED ORDER — FLUTICASONE PROPIONATE 50 MCG/ACT NA SUSP
1.0000 | Freq: Every day | NASAL | Status: DC
  Administered 2011-07-27 – 2011-07-29 (×3): 1 via NASAL
  Filled 2011-07-27 (×2): qty 16

## 2011-07-27 MED ORDER — METOCLOPRAMIDE HCL 5 MG/ML IJ SOLN: 5.0000 mg | Freq: Three times a day (TID) | INTRAMUSCULAR | Status: DC | PRN

## 2011-07-27 MED ORDER — ONDANSETRON HCL 4 MG/2ML IJ SOLN
4.0000 mg | Freq: Four times a day (QID) | INTRAMUSCULAR | Status: DC | PRN
  Administered 2011-07-27: 4 mg via INTRAVENOUS
  Filled 2011-07-27: qty 2

## 2011-07-27 MED ORDER — 0.9 % SODIUM CHLORIDE (POUR BTL) OPTIME
TOPICAL | Status: DC | PRN
  Administered 2011-07-27: 1000 mL

## 2011-07-27 MED ORDER — THROMBIN 5000 UNITS EX SOLR
CUTANEOUS | Status: AC
  Filled 2011-07-27: qty 10000

## 2011-07-27 MED ORDER — PROPRANOLOL-HCTZ 40-25 MG PO TABS: 1.0000 | ORAL_TABLET | Freq: Every day | ORAL | Status: DC

## 2011-07-27 MED ORDER — ONDANSETRON HCL 4 MG PO TABS
4.0000 mg | ORAL_TABLET | Freq: Four times a day (QID) | ORAL | Status: DC | PRN
  Administered 2011-07-29: 4 mg via ORAL
  Filled 2011-07-27: qty 1

## 2011-07-27 MED ORDER — PAROXETINE HCL 20 MG PO TABS
20.0000 mg | ORAL_TABLET | Freq: Every day | ORAL | Status: DC
  Administered 2011-07-27 – 2011-07-28 (×2): 20 mg via ORAL
  Filled 2011-07-27 (×3): qty 1

## 2011-07-27 MED ORDER — GLYCOPYRROLATE 0.2 MG/ML IJ SOLN
INTRAMUSCULAR | Status: DC | PRN
  Administered 2011-07-27: 0.4 mg via INTRAVENOUS

## 2011-07-27 SURGICAL SUPPLY — 45 items
APL SKNCLS STERI-STRIP NONHPOA (GAUZE/BANDAGES/DRESSINGS) ×1
BAG SPEC THK2 15X12 ZIP CLS (MISCELLANEOUS) ×1
BAG ZIPLOCK 12X15 (MISCELLANEOUS) ×2 IMPLANT
BENZOIN TINCTURE PRP APPL 2/3 (GAUZE/BANDAGES/DRESSINGS) ×2 IMPLANT
BUR EGG ELITE 4.0 (BURR) IMPLANT
CLEANER TIP ELECTROSURG 2X2 (MISCELLANEOUS) ×2 IMPLANT
CLOTH BEACON ORANGE TIMEOUT ST (SAFETY) ×2 IMPLANT
CONT SPEC 4OZ CLIKSEAL STRL BL (MISCELLANEOUS) ×2 IMPLANT
DRAIN PENROSE 18X1/4 LTX STRL (WOUND CARE) IMPLANT
DRAPE MICROSCOPE LEICA (MISCELLANEOUS) ×2 IMPLANT
DRAPE POUCH INSTRU U-SHP 10X18 (DRAPES) ×2 IMPLANT
DRAPE SURG 17X11 SM STRL (DRAPES) ×2 IMPLANT
DRSG ADAPTIC 3X8 NADH LF (GAUZE/BANDAGES/DRESSINGS) ×2 IMPLANT
DRSG PAD ABDOMINAL 8X10 ST (GAUZE/BANDAGES/DRESSINGS) ×2 IMPLANT
DURAPREP 26ML APPLICATOR (WOUND CARE) ×2 IMPLANT
ELECT BLADE TIP CTD 4 INCH (ELECTRODE) ×2 IMPLANT
ELECT REM PT RETURN 9FT ADLT (ELECTROSURGICAL) ×2
ELECTRODE REM PT RTRN 9FT ADLT (ELECTROSURGICAL) ×1 IMPLANT
GLOVE BIO SURGEON STRL SZ8 (GLOVE) ×4 IMPLANT
GLOVE ECLIPSE 8.0 STRL XLNG CF (GLOVE) ×2 IMPLANT
GLOVE INDICATOR 8.0 STRL GRN (GLOVE) ×4 IMPLANT
GOWN STRL REIN XL XLG (GOWN DISPOSABLE) ×2 IMPLANT
KIT BASIN OR (CUSTOM PROCEDURE TRAY) ×2 IMPLANT
KIT POSITIONING SURG ANDREWS (MISCELLANEOUS) ×2 IMPLANT
MANIFOLD NEPTUNE II (INSTRUMENTS) ×2 IMPLANT
NDL SPNL 18GX3.5 QUINCKE PK (NEEDLE) ×3 IMPLANT
NEEDLE SPNL 18GX3.5 QUINCKE PK (NEEDLE) ×6 IMPLANT
NS IRRIG 1000ML POUR BTL (IV SOLUTION) ×2 IMPLANT
PATTIES SURGICAL .5 X.5 (GAUZE/BANDAGES/DRESSINGS) IMPLANT
PATTIES SURGICAL .75X.75 (GAUZE/BANDAGES/DRESSINGS) IMPLANT
PATTIES SURGICAL 1X1 (DISPOSABLE) IMPLANT
POSITIONER SURGICAL ARM (MISCELLANEOUS) ×2 IMPLANT
SPONGE GAUZE 4X4 12PLY (GAUZE/BANDAGES/DRESSINGS) ×2 IMPLANT
SPONGE LAP 18X18 X RAY DECT (DISPOSABLE) ×1 IMPLANT
SPONGE LAP 4X18 X RAY DECT (DISPOSABLE) IMPLANT
SPONGE SURGIFOAM ABS GEL 100 (HEMOSTASIS) ×2 IMPLANT
STAPLER VISISTAT 35W (STAPLE) IMPLANT
SUT VIC AB 1 CT1 27 (SUTURE) ×4
SUT VIC AB 1 CT1 27XBRD ANTBC (SUTURE) ×2 IMPLANT
SUT VIC AB 2-0 CT1 27 (SUTURE) ×4
SUT VIC AB 2-0 CT1 27XBRD (SUTURE) ×2 IMPLANT
TAPE CLOTH SURG 4X10 WHT LF (GAUZE/BANDAGES/DRESSINGS) ×1 IMPLANT
TOWEL OR 17X26 10 PK STRL BLUE (TOWEL DISPOSABLE) ×4 IMPLANT
TRAY LAMINECTOMY (CUSTOM PROCEDURE TRAY) ×2 IMPLANT
WATER STERILE IRR 1500ML POUR (IV SOLUTION) ×2 IMPLANT

## 2011-07-27 NOTE — Brief Op Note (Signed)
07/27/2011  9:46 AM  PATIENT:  Mark [name]  61 y.o. male  PRE-OPERATIVE DIAGNOSIS:  spinal stenosis   POST-OPERATIVE DIAGNOSIS:  spinal stenosis   PROCEDURE:  Procedure(s) (LRB): DECOMPRESSIVE LUMBAR LAMINECTOMY LEVEL 1 (Right)  SURGEON:  Surgeon(s) and Role:    * Drucilla Schmidt, MD - Primary    * Jacki Cones, MD - Assisting  PHYSICIAN ASSISTANT:   ASSISTANTS: nurse  ANESTHESIA:   general  EBL:  Total I/O In: 1550 [I.V.:1550] Out: 25 [Blood:25]  BLOOD ADMINISTERED:none  DRAINS: none   LOCAL MEDICATIONS USED:  NONE  SPECIMEN:  No Specimen  DISPOSITION OF SPECIMEN:  N/A  COUNTS:  YES  TOURNIQUET:  * No tourniquets in log *  DICTATION: .Other Dictation: Dictation Number [id number]  PLAN OF CARE: Admit for overnight observation  PATIENT DISPOSITION:  PACU - hemodynamically stable.   Delay start of Pharmacological VTE agent (>24hrs) due to surgical blood loss or risk of bleeding: yes

## 2011-07-27 NOTE — Anesthesia Postprocedure Evaluation (Signed)
  Anesthesia Post-op Note  Patient: Mark [name]  Procedure(s) Performed: Procedure(s) (LRB): DECOMPRESSIVE LUMBAR LAMINECTOMY LEVEL 1 (Right)  Patient Location: PACU  Anesthesia Type: General  Level of Consciousness: awake and alert   Airway and Oxygen Therapy: Patient Spontanous Breathing  Post-op Pain: mild  Post-op Assessment: Post-op Vital signs reviewed, Patient's Cardiovascular Status Stable, Respiratory Function Stable, Patent Airway and No signs of Nausea or vomiting  Post-op Vital Signs: stable  Complications: No apparent anesthesia complications

## 2011-07-27 NOTE — Addendum Note (Signed)
Addendum  created 07/27/11 1202 by Edison Pace, CRNA   Modules edited:Anesthesia Medication Administration

## 2011-07-27 NOTE — Addendum Note (Signed)
Addendum  created 07/27/11 1201 by Edison Pace, CRNA   Modules edited:Anesthesia Medication Administration

## 2011-07-27 NOTE — Anesthesia Procedure Notes (Signed)
Procedure Name: Intubation Date/Time: 07/27/2011 7:27 AM Performed by: Edison Pace Pre-anesthesia Checklist: Patient identified, Timeout performed, Emergency Drugs available, Suction available and Patient being monitored Patient Re-evaluated:Patient Re-evaluated prior to inductionOxygen Delivery Method: Circle system utilized Preoxygenation: Pre-oxygenation with 100% oxygen Intubation Type: IV induction Ventilation: Mask ventilation without difficulty Laryngoscope Size: Mac and 4 Grade View: Grade II Tube type: Oral Tube size: 7.5 mm Number of attempts: 1 Airway Equipment and Method: Stylet Placement Confirmation: ETT inserted through vocal cords under direct vision,  positive ETCO2 and breath sounds checked- equal and bilateral Secured at: 21 cm Tube secured with: Tape Dental Injury: Teeth and Oropharynx as per pre-operative assessment  Difficulty Due To: Difficulty was unanticipated Future Recommendations: Recommend- induction with short-acting agent, and alternative techniques readily available

## 2011-07-27 NOTE — Preoperative (Signed)
Beta Blockers   Reason not to administer Beta Blockers:Not Applicablept took beta blocker this a.m. 

## 2011-07-27 NOTE — Transfer of Care (Signed)
Immediate Anesthesia Transfer of Care Note  Patient: Mark [name]  Procedure(s) Performed: Procedure(s) (LRB): DECOMPRESSIVE LUMBAR LAMINECTOMY LEVEL 1 (Right)  Patient Location: PACU  Anesthesia Type: General  Level of Consciousness: awake, oriented and lethargic  Airway & Oxygen Therapy: Patient Spontanous Breathing and Patient connected to face mask oxygen  Post-op Assessment: Report given to PACU RN, Post -op Vital signs reviewed and stable and Patient moving all extremities  Post vital signs: Reviewed and stable  Complications: No apparent anesthesia complications

## 2011-07-27 NOTE — Anesthesia Preprocedure Evaluation (Signed)
Anesthesia Evaluation  Patient identified by MRN, date of birth, ID band Patient awake    Reviewed: Allergy & Precautions, H&P , NPO status , Patient's Chart, lab work & pertinent test results  Airway Mallampati: II TM Distance: <3 FB Neck ROM: Full    Dental No notable dental hx.    Pulmonary neg pulmonary ROS, asthma ,  breath sounds clear to auscultation  Pulmonary exam normal       Cardiovascular hypertension, Pt. on medications negative cardio ROS  Rhythm:Regular Rate:Normal     Neuro/Psych Depression negative neurological ROS     GI/Hepatic Neg liver ROS, GERD-  Medicated,  Endo/Other  negative endocrine ROS  Renal/GU negative Renal ROS  negative genitourinary   Musculoskeletal negative musculoskeletal ROS (+)   Abdominal   Peds negative pediatric ROS (+)  Hematology negative hematology ROS (+)   Anesthesia Other Findings   Reproductive/Obstetrics negative OB ROS                           Anesthesia Physical Anesthesia Plan  ASA: II  Anesthesia Plan: General   Post-op Pain Management:    Induction: Intravenous  Airway Management Planned: Oral ETT  Additional Equipment:   Intra-op Plan:   Post-operative Plan: Extubation in OR  Informed Consent: I have reviewed the patients History and Physical, chart, labs and discussed the procedure including the risks, benefits and alternatives for the proposed anesthesia with the patient or authorized representative who has indicated his/her understanding and acceptance.   Dental advisory given  Plan Discussed with: CRNA  Anesthesia Plan Comments:         Anesthesia Quick Evaluation

## 2011-07-27 NOTE — H&P (Signed)
Mark [name] is an 61 y.o. male.   Chief Complaint: painful rt leg HPmyelogram demonstrates central and lateral recess stenosis,rt  Past Medical History  Diagnosis Date  . HYPERTENSION 03/07/2007  . Headache 03/07/2007  . PROSTATE CANCER, HX OF 03/07/2007  . ASTHMA 03/12/2007  . HYPERLIPIDEMIA 06/11/2007  . GERD 04/02/2009  . HIATAL HERNIA 04/28/2009  . TRANSAMINASES, SERUM, ELEVATED 04/28/2009  . Gastroparesis 05/01/2009  . Anxiety state, unspecified 09/01/2009  . Hiatal hernia     Past Surgical History  Procedure Date  . Prostatectomy 07/10/06  . Appendectomy   . Rotator cuff repair     9/09 on right    Family History  Problem Relation Age of Onset  . Pancreatic cancer Mother 94  . Hypertension Father   . Hypertension Brother     x 2  . Colon cancer Neg Hx    Social History:  reports that he quit smoking about 28 years ago. His smoking use included Cigarettes. He has a 1 pack-year smoking history. He has never used smokeless tobacco. He reports that he does not drink alcohol or use illicit drugs.  Allergies:  Allergies  Allergen Reactions  . Propoxyphene Hcl Nausea And Vomiting         Medications Prior to Admission  Medication Sig Dispense Refill  . albuterol (PROAIR HFA) 108 (90 BASE) MCG/ACT inhaler Inhale 2 puffs into the lungs 4 (four) times daily as needed.        . AMBULATORY NON FORMULARY MEDICATION Medication Name: Domperidone 10 mg Take 1 tablet by mouth three times daily  270 tablet  1  . butalbital-acetaminophen-caffeine (FIORICET, ESGIC) 50-325-40 MG per tablet TAKE 1 TABLET BY MOUTH EVERY DAY AS NEEDED  30 tablet  2  . DETROL LA 4 MG 24 hr capsule TAKE 1 CAPSULE EVERY DAY  90 capsule  1  . diclofenac (VOLTAREN) 75 MG EC tablet Take 1 tablet (75 mg total) by mouth 2 (two) times daily.  60 tablet  2  . esomeprazole (NEXIUM) 40 MG capsule Take 1 capsule (40 mg total) by mouth 2 (two) times daily.  180 capsule  1  . fexofenadine (ALLEGRA) 180 MG tablet Take 180 mg  by mouth daily.      . fluticasone (FLONASE) 50 MCG/ACT nasal spray USE ONE SPRAY AS DIRECTED ONCE DAILY  48 g  3  . ketorolac (TORADOL) 10 MG tablet Take 10 mg by mouth every 6 (six) hours as needed.      . ondansetron (ZOFRAN) 4 MG tablet Take 1 tablet (4 mg total) by mouth every 8 (eight) hours as needed.  30 tablet  3  . PARoxetine (PAXIL) 20 MG tablet Take 1 tablet (20 mg total) by mouth at bedtime.  90 tablet  1  . PRESCRIPTION MEDICATION Inject 1 mL as directed See admin instructions. Trimix -PGE 20,PAPAVERINE 30,PHENTOLAMINE 0.5-AS NEEDED FOR ERECTILE DYSFUNCTION      . promethazine (PHENERGAN) 25 MG tablet Take 1 tablet (25 mg total) by mouth every 6 (six) hours as needed for nausea. PRN for nausea  30 tablet  3  . propranolol-hydrochlorothiazide (INDERIDE) 40-25 MG per tablet TAKE 1 TABLET BY MOUTH TWICE A DAY  180 tablet  2  . simvastatin (ZOCOR) 40 MG tablet Take 40 mg by mouth at bedtime.        [name] Kitchen azelastine (ASTELIN) 137 MCG/SPRAY nasal spray USE 2 PUFFS EACH NOSTRIL DAILY  1 mL  0    No results found for this  or any previous visit (from the past 48 hour(s)). No results found.  ROS  Blood pressure 140/95, pulse 68, temperature 97 F (36.1 C), temperature source Oral, resp. rate 18, SpO2 98.00%. Physical Exam  Constitutional: He is oriented to person, place, and time. He appears well-developed and well-nourished.  HENT:  Head: Normocephalic and atraumatic.  Right Ear: External ear normal.  Left Ear: External ear normal.  Nose: Nose normal.  Mouth/Throat: Oropharynx is clear and moist.  Eyes: Conjunctivae and EOM are normal. Pupils are equal, round, and reactive to light.  Neck: Normal range of motion. Neck supple.  Cardiovascular: Normal rate, regular rhythm, normal heart sounds and intact distal pulses.   Respiratory: Effort normal and breath sounds normal.  GI: Soft. Bowel sounds are normal.  Musculoskeletal: Normal range of motion.  Neurological: He is alert and  oriented to person, place, and time. He has normal reflexes.  Skin: Skin is warm and dry.  Psychiatric: He has a normal mood and affect. His behavior is normal. Judgment and thought content normal.     Assessment/Plan Rt leg pain secondary to spinal stenosis L4-5 Decompressive laminectomy L4-5  Mark [name] P 07/27/2011, 7:20 AM

## 2011-07-28 NOTE — Evaluation (Signed)
Physical Therapy Evaluation Patient Details Name: Mark [name] MRN: [id number] DOB: [birth year] Today's Date: 07/28/2011 Time: 7829-5621 PT Time Calculation (min): 23 min  PT Assessment / Plan / Recommendation Clinical Impression  pt admitted for  lumbar decompression . pt will benefit from PT to improve in mobility, safety and demonstrated back precautions.    PT Assessment  Patient needs continued PT services    Follow Up Recommendations  No PT follow up;Supervision/Assistance - 24 hour    Barriers to Discharge   pt states he may be home alone after weekend. during the day.    lEquipment Recommendations  Rolling walker with 5" wheels;3 in 1 bedside comode    Recommendations for Other Services     Frequency Min 5X/week    Precautions / Restrictions Precautions Precautions: Back Precaution Comments: provided hand out w/ precautions.   Pertinent Vitals/Pain 6/10      Mobility  Bed Mobility Bed Mobility: Sit to Sidelying Left Sit to Sidelying Left: 5: Supervision Details for Bed Mobility Assistance: VC for back precautions Transfers Transfers: Sit to Stand;Stand to Sit Sit to Stand: From bed;With upper extremity assist Stand to Sit: 5: Supervision;With upper extremity assist;To bed Details for Transfer Assistance: vc for back precautions. Ambulation/Gait Ambulation/Gait Assistance: 4: Min assist Ambulation Distance (Feet): 80 Feet Assistive device: Rolling walker Ambulation/Gait Assistance Details: pt would stop and wiggle RLE, shake it as p stated it felt asleep. pt halted several times during his walk. Gait Pattern: Step-through pattern;Decreased dorsiflexion - right    Exercises     PT Diagnosis: Difficulty walking;Acute pain  PT Problem List: Decreased activity tolerance;Decreased mobility;Decreased knowledge of precautions;Decreased knowledge of use of DME;Pain PT Treatment Interventions: DME instruction;Gait training;Stair training;Functional mobility  training;Patient/family education   PT Goals Acute Rehab PT Goals PT Goal Formulation: With patient Time For Goal Achievement: 08/04/11 Potential to Achieve Goals: Good Pt will go Supine/Side to Sit: Independently PT Goal: Supine/Side to Sit - Progress: Goal set today Pt will go Sit to Supine/Side: Independently PT Goal: Sit to Supine/Side - Progress: Goal set today Pt will go Stand to Sit: with supervision PT Goal: Stand to Sit - Progress: Goal set today Pt will Ambulate: 51 - 150 feet;with supervision;with least restrictive assistive device PT Goal: Ambulate - Progress: Goal set today Pt will Go Up / Down Stairs: Flight;with supervision PT Goal: Up/Down Stairs - Progress: Goal set today Additional Goals Additional Goal #1: demo 3/3 back precautiions. PT Goal: Additional Goal #1 - Progress: Goal set today  Visit Information  Last PT Received On: 07/28/11 Assistance Needed: +1    Subjective Data  Subjective: my R leg has stabbing pain. Patient Stated Goal: to have no pain in my leg   Prior Functioning  Home Living Lives With: Spouse Available Help at Discharge: Family Type of Home: House Home Access: Stairs to enter Secretary/administrator of Steps: 6 Entrance Stairs-Rails: Right;Left Home Layout: Two level;Bed/bath upstairs Alternate Level Stairs-Number of Steps: 13 Alternate Level Stairs-Rails: Left Bathroom Toilet: Standard Home Adaptive Equipment: None Prior Function Level of Independence: Independent Able to Take Stairs?: Yes Driving: Yes Vocation: Full time employment Communication Communication: No difficulties    Cognition  Overall Cognitive Status: Appears within functional limits for tasks assessed/performed Arousal/Alertness: Awake/alert Orientation Level: Appears intact for tasks assessed Behavior During Session: [facility] for tasks performed    Extremity/Trunk Assessment Right Lower Extremity Assessment RLE ROM/Strength/Tone: Deficits RLE  ROM/Strength/Tone Deficits: noted decreased strength of dorsiflexion RLE Sensation: Deficits RLE Sensation Deficits: describes tingling,  shootimg sensation.   Balance    End of Session PT - End of Session Activity Tolerance: Patient tolerated treatment well Patient left: in bed;with call bell/phone within reach   Rada Hay 07/28/2011, 1:53 PM  [phone]

## 2011-07-28 NOTE — Care Management Note (Signed)
    Page 1 of 2   07/29/2011     2:45:12 PM   CARE MANAGEMENT NOTE 07/29/2011  Patient:  Mark [name], Mark [name]   Account Number:  [id number]  Date Initiated:  07/28/2011  Documentation initiated by:  Colleen Can  Subjective/Objective Assessment:   dx spinal stenosis; decomprsssion lumbar laminectomy on day of admission     Action/Plan:   CM spoke with patient. Plans are for his return to home where spouse will be caregiver. Will need rw and 3n1   Anticipated DC Date:  07/29/2011   Anticipated DC Plan:  HOME/SELF CARE  In-house referral  NA      DC Planning Services  CM consult      PAC Choice  NA   Choice offered to / List presented to:  NA   DME arranged  3-N-1  Levan Hurst      DME agency  [facility]     [facility] arranged  NA      Status of service:  Completed, signed off Medicare Important Message given?  NO (If response is "NO", the following Medicare IM given date fields will be blank) Date Medicare IM given:   Date Additional Medicare IM given:    Discharge Disposition:  HOME/SELF CARE  Per UR Regulation:  Reviewed for med. necessity/level of care/duration of stay  If discussed at Long Length of Stay Meetings, dates discussed:    Comments:

## 2011-07-28 NOTE — Op Note (Signed)
NAMEJAIEL, SARACENO NO.:  [id number]  MEDICAL RECORD NO.:  [id number]  LOCATION:  1617                         FACILITY:  [facility]  PHYSICIAN:  Marlowe Kays, M.D.  DATE OF BIRTH:  1950-04-24  DATE OF PROCEDURE:  07/27/2011 DATE OF DISCHARGE:                              OPERATIVE REPORT   PREOPERATIVE DIAGNOSIS:  Right leg pain secondary to central and right lateral recess stenosis at L4-L5.  POSTOPERATIVE DIAGNOSIS:  Right leg pain secondary to central and right lateral recess stenosis at L4-L5.  OPERATION:  Central and right lateral recess decompression at L4-L5.  SURGEON:  Marlowe Kays, M.D.  ASSISTANT:  Georges Lynch. Darrelyn Hillock, M.D.  ANESTHESIA:  General.  PATHOLOGY AND JUSTIFICATION FOR PROCEDURE:  He has had progressive right leg pain, with myelogram CT scan performed on June 22, 2011, demonstrating a substantial defect at L4-L5 on the right and a small defect centrally at L4-L5.  He also has some mild spinal stenosis at multiple other levels, but this specifically was seen to be in the major symptomatic area.  PROCEDURE:  Satisfied general anesthesia, prophylactic antibiotics, knee- chest position on the Edmonds frame, the back was prepped with DuraPrep and draped in a sterile field.  With 2 spinal needles and a lateral x- ray, I localized the L4-L5 interspace.  A time-out was performed.  The back was draped in a sterile field with an [place].  A midline incision down to the spinous processes of, what we thought were, L4 and L5 which I tagged with Kocher clamps, and a second lateral x-ray confirmed that we were at L4-L5.  Accordingly, we dissected the soft tissue off the neural arches of L4 and L5 bilaterally and placed self-retaining McCullough retractors.  With double-action rongeur, I removed the major inferior portion of L4 and the superior major portion of the spinous process of L5.  We then worked centrally and laterally removing most of the  neural arches and then brought in the microscope to complete the decompression.  I took a third x-ray confirming the cephalad and caudad extents of the dissection and compared to the myelogram pictures, so that we had the area of concern fully decompressed.  We checked with hockey-stick, and foramens of the L4 and L5 nerve roots were patent. The wound was irrigated with sterile saline, and the dura covered with Gelfoam soaked in thrombin.  Self-retaining retractors were removed. There was no unusual bleeding.  I then closed the wound with interrupted #1 Vicryl in the fascia and the paralumbar muscle, with 2-0 Vicryl in superficial subcutaneous tissue and staples on the skin. Betadine, Adaptic, dry sterile dressing were applied.  He was taken to the recovery room in satisfactory condition with no known complications. Estimated blood loss was minimal and about 100 cc.          ______________________________ Marlowe Kays, M.D.     JA/MEDQ  D:  07/27/2011  T:  07/28/2011  Job:  [id number]

## 2011-07-28 NOTE — Progress Notes (Signed)
Con't to need IV pain meds. Voiding. OOB to BR. Dsg. Dry. Little to no leg pain.  Back "sore". Prob. Home in am.

## 2011-07-29 MED ORDER — METHOCARBAMOL 500 MG PO TABS: 500.0000 mg | ORAL_TABLET | Freq: Four times a day (QID) | ORAL | Status: AC

## 2011-07-29 MED ORDER — OXYCODONE-ACETAMINOPHEN 7.5-325 MG PO TABS: 1.0000 | ORAL_TABLET | ORAL | Status: AC | PRN

## 2011-07-29 MED ORDER — METHOCARBAMOL 500 MG PO TABS: 500.0000 mg | ORAL_TABLET | Freq: Four times a day (QID) | ORAL | Status: AC | PRN

## 2011-07-29 MED ORDER — DIPHENHYDRAMINE HCL 25 MG PO CAPS: 25.0000 mg | ORAL_CAPSULE | Freq: Four times a day (QID) | ORAL | Status: DC | PRN

## 2011-07-29 MED ORDER — MORPHINE SULFATE 2 MG/ML IJ SOLN
1.0000 mg | INTRAMUSCULAR | Status: DC | PRN
  Administered 2011-07-29: 2 mg via INTRAVENOUS
  Filled 2011-07-29: qty 1

## 2011-07-29 MED ORDER — DIPHENHYDRAMINE HCL 25 MG PO CAPS
25.0000 mg | ORAL_CAPSULE | Freq: Four times a day (QID) | ORAL | Status: DC | PRN
Start: 2011-07-29 — End: 2011-07-29
  Administered 2011-07-29: 25 mg via ORAL
  Filled 2011-07-29: qty 1

## 2011-07-29 NOTE — Progress Notes (Signed)
Patient ID: Mark [name], male   DOB: 02/09/1951, 61 y.o.   MRN: [id number] Po day 2.  Now walking satis.  Rt leg pain gone.  Dressing changed--wound clean and dry.  Plan discharge this am --inst in chart.

## 2011-07-29 NOTE — Progress Notes (Signed)
Pt for d/c home today with DME's already delivered. NSL d/c'd. Dressing CDI to back- changed this am. No changes in am assessments today. D/C instructions & Rx given with verbalized understanding. Wife to bring pt home. Pt able to ambulate in room with walker.

## 2011-07-29 NOTE — Progress Notes (Signed)
Physical Therapy Treatment Patient Details Name: MAHARI STRAHM MRN: [id number] DOB: 12-03-1950 Today's Date: 07/29/2011 Time: 4098-1191 PT Time Calculation (min): 30 min  PT Assessment / Plan / Recommendation Comments on Treatment Session  I still hurt.  Pt plans to D/C to home today.    Follow Up Recommendations  No PT follow up;Supervision/Assistance - 24 hour    Barriers to Discharge        Equipment Recommendations  Rolling walker with 5" wheels;3 in 1 bedside comode    Recommendations for Other Services    Frequency Min 5X/week   Plan Discharge plan remains appropriate    Precautions / Restrictions     Pertinent Vitals/Pain C/o 8/10 low back pain, ICE pack applied    Mobility  Bed Mobility Bed Mobility: Left Sidelying to Sit Left Sidelying to Sit: 4: Min guard Details for Bed Mobility Assistance: tactile VC's on proper "log roll" technique Transfers Transfers: Sit to Stand;Stand to Sit Sit to Stand: 4: Min guard;From bed Stand to Sit: 4: Min guard;To chair/3-in-1 Details for Transfer Assistance: 25% VC's on proper technique and hand placement Ambulation/Gait Ambulation/Gait Assistance: 4: Min guard Ambulation Distance (Feet): 65 Feet Assistive device: Rolling walker Ambulation/Gait Assistance Details: B hips and knees buckling and c/o R le weakness with pain radiating down r side Gait Pattern: Step-to pattern;Trunk flexed Stairs: Yes Stairs Assistance: 4: Min assist Stairs Assistance Details (indicate cue type and reason): 50% VC's on proper tech and safety Stair Management Technique: One rail Right;With crutches;Forwards Number of Stairs: 4  Wheelchair Mobility Wheelchair Mobility: No       Visit Information  Last PT Received On: 07/29/11 Assistance Needed: +1    Subjective Data  Subjective: I can't walk Patient Stated Goal: no pain   Cognition    fair (meds ?)   Balance   fair  End of Session PT - End of Session Equipment Utilized During  Treatment: Gait belt Activity Tolerance: Patient limited by pain Patient left: in chair;with call bell/phone within reach;Other (comment) (ICE to back)    Felecia Shelling  PTA [facility]  Acute  Rehab Pager     [phone]

## 2011-07-30 NOTE — Discharge Summary (Signed)
NAMEURIAS, SHEEK NO.:  [id number]  MEDICAL RECORD NO.:  [id number]  LOCATION:  1617                         FACILITY:  [facility]  PHYSICIAN:  Marlowe Kays, M.D.  DATE OF BIRTH:  March 07, 1950  DATE OF ADMISSION:  07/27/2011 DATE OF DISCHARGE:  07/29/2011                              DISCHARGE SUMMARY   ADMITTING DIAGNOSIS:  Spinal stenosis, L4-L5, central and to the right.  DISCHARGE DIAGNOSIS:  Spinal stenosis, L4-L5, central and to the right.  OPERATION:  Central and right lateral decompressive laminectomy on Jul 27, 2011.  SUMMARY:  This man is admitted at this time because of severe back and right leg pain, unresponsive to nonsurgical treatment.  A myelogram CT scan has demonstrated a large extradural defect at L4-L5 on the right. As noted above, the surgery was performed without complication, assisted by Dr. Ranee Gosselin.  At the time of discharge, his wound was clean and dry, and the incision had been redressed.  He was walking well with a rolling walker, which will be provided for him at discharge.  He was given wound instructions and prescriptions for Norco 7.5-325 and Robaxin.  He can be showering after another 24 hours if his wound is dry, but otherwise keep it covered and he was given dressings at discharge.  Return for appointment to my office in 2 weeks from Surgery as sooner if there is a problem.          ______________________________ Marlowe Kays, M.D.     JA/MEDQ  D:  07/29/2011  T:  07/30/2011  Job:  [id number]

## 2011-08-20 ENCOUNTER — Other Ambulatory Visit: Payer: Self-pay | Admitting: Internal Medicine

## 2011-10-01 ENCOUNTER — Other Ambulatory Visit: Payer: Self-pay | Admitting: Internal Medicine

## 2011-11-12 ENCOUNTER — Other Ambulatory Visit: Payer: Self-pay | Admitting: Internal Medicine

## 2011-11-22 ENCOUNTER — Ambulatory Visit (INDEPENDENT_AMBULATORY_CARE_PROVIDER_SITE_OTHER): Payer: 59

## 2011-11-22 DIAGNOSIS — Z23 Encounter for immunization: Secondary | ICD-10-CM

## 2011-12-13 ENCOUNTER — Telehealth: Payer: Self-pay | Admitting: Internal Medicine

## 2011-12-13 ENCOUNTER — Ambulatory Visit: Payer: 59 | Admitting: Internal Medicine

## 2011-12-13 NOTE — Telephone Encounter (Signed)
Spoke with patient and rescheduled patient on 02/01/12 for OV he missed today.

## 2011-12-29 ENCOUNTER — Other Ambulatory Visit: Payer: Self-pay | Admitting: Internal Medicine

## 2012-01-07 ENCOUNTER — Other Ambulatory Visit: Payer: Self-pay | Admitting: Internal Medicine

## 2012-01-10 ENCOUNTER — Encounter: Payer: Self-pay | Admitting: Internal Medicine

## 2012-01-10 ENCOUNTER — Ambulatory Visit (INDEPENDENT_AMBULATORY_CARE_PROVIDER_SITE_OTHER): Payer: 59 | Admitting: Internal Medicine

## 2012-01-10 VITALS — BP 134/92 | HR 64 | Temp 98.5°F | Ht 70.25 in | Wt 199.0 lb

## 2012-01-10 DIAGNOSIS — E663 Overweight: Secondary | ICD-10-CM

## 2012-01-10 DIAGNOSIS — Z0000 Encounter for general adult medical examination without abnormal findings: Secondary | ICD-10-CM

## 2012-01-10 DIAGNOSIS — Z2911 Encounter for prophylactic immunotherapy for respiratory syncytial virus (RSV): Secondary | ICD-10-CM

## 2012-01-10 LAB — BASIC METABOLIC PANEL WITH GFR
BUN: 14 mg/dL (ref 6–23)
CO2: 31 meq/L (ref 19–32)
Calcium: 9.1 mg/dL (ref 8.4–10.5)
Chloride: 99 meq/L (ref 96–112)
Creatinine, Ser: 0.9 mg/dL (ref 0.4–1.5)
GFR: 116.53 mL/min
Glucose, Bld: 97 mg/dL (ref 70–99)
Potassium: 3.6 meq/L (ref 3.5–5.1)
Sodium: 138 meq/L (ref 135–145)

## 2012-01-10 LAB — CBC WITH DIFFERENTIAL/PLATELET
Basophils Absolute: 0 10*3/uL (ref 0.0–0.1)
Basophils Relative: 0.9 % (ref 0.0–3.0)
Eosinophils Absolute: 0.2 10*3/uL (ref 0.0–0.7)
Eosinophils Relative: 3.4 % (ref 0.0–5.0)
HCT: 45.9 % (ref 39.0–52.0)
Hemoglobin: 15.5 g/dL (ref 13.0–17.0)
Lymphocytes Relative: 34.6 % (ref 12.0–46.0)
Lymphs Abs: 1.8 10*3/uL (ref 0.7–4.0)
MCHC: 33.7 g/dL (ref 30.0–36.0)
MCV: 93.9 fl (ref 78.0–100.0)
Monocytes Absolute: 0.8 10*3/uL (ref 0.1–1.0)
Monocytes Relative: 14.8 % — ABNORMAL HIGH (ref 3.0–12.0)
Neutro Abs: 2.4 10*3/uL (ref 1.4–7.7)
Neutrophils Relative %: 46.3 % (ref 43.0–77.0)
Platelets: 282 10*3/uL (ref 150.0–400.0)
RBC: 4.89 Mil/uL (ref 4.22–5.81)
RDW: 12.4 % (ref 11.5–14.6)
WBC: 5.2 10*3/uL (ref 4.5–10.5)

## 2012-01-10 LAB — HEPATIC FUNCTION PANEL
ALT: 38 U/L (ref 0–53)
AST: 32 U/L (ref 0–37)
Albumin: 3.9 g/dL (ref 3.5–5.2)
Alkaline Phosphatase: 79 U/L (ref 39–117)
Bilirubin, Direct: 0.1 mg/dL (ref 0.0–0.3)
Total Bilirubin: 0.9 mg/dL (ref 0.3–1.2)
Total Protein: 6.9 g/dL (ref 6.0–8.3)

## 2012-01-10 LAB — LIPID PANEL
HDL: 42.6 mg/dL (ref >39.00)
Triglycerides: 268 mg/dL — ABNORMAL HIGH (ref 0.0–149.0)

## 2012-01-10 LAB — TSH: TSH: 0.97 u[IU]/mL (ref 0.35–5.50)

## 2012-01-10 NOTE — Progress Notes (Signed)
Patient ID: Mark [name], male   DOB: 1950/04/24, 61 y.o.   MRN: [id number] CPX  Past Medical History  Diagnosis Date  . HYPERTENSION 03/07/2007  . Headache 03/07/2007  . PROSTATE CANCER, HX OF 03/07/2007  . ASTHMA 03/12/2007  . HYPERLIPIDEMIA 06/11/2007  . GERD 04/02/2009  . HIATAL HERNIA 04/28/2009  . TRANSAMINASES, SERUM, ELEVATED 04/28/2009  . Gastroparesis 05/01/2009  . Anxiety state, unspecified 09/01/2009  . Hiatal hernia     History   Social History  . Marital Status: Married    Spouse Name: N/A    Number of Children: N/A  . Years of Education: N/A   Occupational History  . Not on file.   Social History Main Topics  . Smoking status: Former Smoker -- 0.5 packs/day for 2 years    Types: Cigarettes    Quit date: 04/09/1983  . Smokeless tobacco: Never Used  . Alcohol Use: No  . Drug Use: No  . Sexually Active: Yes   Other Topics Concern  . Not on file   Social History Narrative  . No narrative on file    Past Surgical History  Procedure Date  . Prostatectomy 07/10/06  . Appendectomy   . Rotator cuff repair     9/09 on right    Family History  Problem Relation Age of Onset  . Pancreatic cancer Mother 21  . Hypertension Father   . Hypertension Brother     x 2  . Colon cancer Neg Hx     Allergies  Allergen Reactions  . Propoxyphene Hcl Nausea And Vomiting         Current Outpatient Prescriptions on File Prior to Visit  Medication Sig Dispense Refill  . albuterol (PROAIR HFA) 108 (90 BASE) MCG/ACT inhaler Inhale 2 puffs into the lungs 4 (four) times daily as needed.        . AMBULATORY NON FORMULARY MEDICATION Medication Name: Domperidone 10 mg Take 1 tablet by mouth three times daily  270 tablet  1  . azelastine (ASTELIN) 137 MCG/SPRAY nasal spray USE 2 PUFFS EACH NOSTRIL DAILY  30 mL  0  . butalbital-acetaminophen-caffeine (FIORICET, ESGIC) 50-325-40 MG per tablet TAKE 1 TABLET BY MOUTH EVERY DAY AS NEEDED  30 tablet  2  . diclofenac (VOLTAREN) 75 MG EC  tablet Take 1 tablet (75 mg total) by mouth 2 (two) times daily.  60 tablet  2  . esomeprazole (NEXIUM) 40 MG capsule Take 1 capsule (40 mg total) by mouth 2 (two) times daily.  180 capsule  1  . fexofenadine (ALLEGRA) 180 MG tablet Take 180 mg by mouth daily.      . fluticasone (FLONASE) 50 MCG/ACT nasal spray USE ONE SPRAY AS DIRECTED ONCE DAILY  48 g  3  . ondansetron (ZOFRAN) 4 MG tablet TAKE 1 TABLET EVERY 8 HOURS AS NEEDED  30 tablet  3  . PARoxetine (PAXIL) 20 MG tablet TAKE 1 TABLET (20 MG TOTAL) BY MOUTH AT BEDTIME.  90 tablet  1  . PRESCRIPTION MEDICATION Inject 1 mL as directed See admin instructions. Trimix -PGE 20,PAPAVERINE 30,PHENTOLAMINE 0.5-AS NEEDED FOR ERECTILE DYSFUNCTION      . propranolol-hydrochlorothiazide (INDERIDE) 40-25 MG per tablet TAKE 1 TABLET BY MOUTH TWICE A DAY  180 tablet  2  . simvastatin (ZOCOR) 40 MG tablet TAKE ONE TABLET AT BEDTIME  30 tablet  0     patient denies chest pain, shortness of breath, orthopnea. Denies lower extremity edema, abdominal pain, change in appetite, change  in bowel movements. Patient denies rashes, musculoskeletal complaints. No other specific complaints in a complete review of systems.   BP 134/92  Pulse 64  Temp 98.5 F (36.9 C) (Oral)  Ht 5' 10.25" (1.784 m)  Wt 199 lb (90.266 kg)  BMI 28.35 kg/m2 Well-developed male in no acute distress. HEENT exam atraumatic, normocephalic, extraocular muscles are intact. Conjunctivae are pink without exudate. Neck is supple without lymphadenopathy, thyromegaly, jugular venous distention. Chest is clear to auscultation without increased work of breathing. Cardiac exam S1-S2 are regular. The PMI is normal. No significant murmurs or gallops. Abdominal exam active bowel sounds, soft, nontender. No abdominal bruits. Extremities no clubbing cyanosis or edema. Peripheral pulses are normal without bruits. Neurologic exam alert and oriented without any motor or sensory deficits.   A/p- well visit-  health maint UTD

## 2012-02-01 ENCOUNTER — Encounter: Payer: Self-pay | Admitting: Internal Medicine

## 2012-02-01 ENCOUNTER — Ambulatory Visit (INDEPENDENT_AMBULATORY_CARE_PROVIDER_SITE_OTHER): Payer: 59 | Admitting: Internal Medicine

## 2012-02-01 VITALS — BP 128/86 | HR 88 | Ht 70.25 in | Wt 209.4 lb

## 2012-02-01 DIAGNOSIS — R112 Nausea with vomiting, unspecified: Secondary | ICD-10-CM

## 2012-02-01 DIAGNOSIS — K3184 Gastroparesis: Secondary | ICD-10-CM

## 2012-02-01 MED ORDER — ESOMEPRAZOLE MAGNESIUM 40 MG PO CPDR: 40.0000 mg | DELAYED_RELEASE_CAPSULE | Freq: Two times a day (BID) | ORAL | Status: DC

## 2012-02-01 MED ORDER — AMBULATORY NON FORMULARY MEDICATION: Status: DC

## 2012-02-01 MED ORDER — PAROXETINE HCL 20 MG PO TABS: 20.0000 mg | ORAL_TABLET | Freq: Every day | ORAL | Status: DC

## 2012-02-01 NOTE — Progress Notes (Signed)
Mark [name] 1950/08/22 MRN [id number]   History of Present Illness:  This is a 61 year old African American male with chronic nausea, gastroesophageal reflux and gastroparesis. He is currently on domperidone 10 mg 3 times a day and Nexium 40 mg in the morning. His gastric emptying scan in February 2011 showed 36% retention in 2 hours. His HIDA scan showed 65% ejection fraction. His last upper endoscopy in February 2011 showed gastritis and esophagitis. He continues to have nausea, especially in the mornings which is usually alleviated by Zofran of Phenergan. His weight has remained stable.Interestingly, his nausea went away when he was out for a month after his back surgery.   Past Medical History  Diagnosis Date  . HYPERTENSION 03/07/2007  . Headache 03/07/2007  . PROSTATE CANCER, HX OF 03/07/2007  . ASTHMA 03/12/2007  . HYPERLIPIDEMIA 06/11/2007  . GERD 04/02/2009  . HIATAL HERNIA 04/28/2009  . TRANSAMINASES, SERUM, ELEVATED 04/28/2009  . Gastroparesis 05/01/2009  . Anxiety state, unspecified 09/01/2009  . Hiatal hernia    Past Surgical History  Procedure Date  . Prostatectomy 07/10/06  . Appendectomy   . Rotator cuff repair     9/09 on right  . Back surgery 05/2011    reports that he quit smoking about 28 years ago. His smoking use included Cigarettes. He has a 1 pack-year smoking history. He has never used smokeless tobacco. He reports that he does not drink alcohol or use illicit drugs. family history includes Hypertension in his brother and father and Pancreatic cancer (age of onset:59) in his mother.  There is no history of Colon cancer. Allergies  Allergen Reactions  . Propoxyphene Hcl Nausea And Vomiting             Review of Systems: Negative for dysphagia odynophagia chest pain  The remainder of the 10 point ROS is negative except as outlined in H&P   Physical Exam: General appearance  Well developed, in no distress. Eyes- non icteric. HEENT nontraumatic,  normocephalic. Mouth no lesions, tongue papillated, no cheilosis. Neck supple without adenopathy, thyroid not enlarged, no carotid bruits, no JVD. Lungs Clear to auscultation bilaterally. Cor normal S1, normal S2, regular rhythm, no murmur,  quiet precordium. Abdomen: Soft nontender with normoactive bowel sounds. Minimal discomfort in subxiphoid area. Liver edge at costal margin. Rectal: Not done. Extremities no pedal edema. Skin no lesions. Neurological alert and oriented x 3. Psychological normal mood and affect.  Assessment and Plan:  Problem #1 Mild gastroparesis. He has chronic nausea which may be psychogenic. He will continue domperidone and Zofran as needed. Symptoms went away and he was out of work for months for back surgery. As soon as he returned to work, his symptoms recurred. He will continue Paxil 20 mg at bedtime. We will increase his Nexium to 40 mg twice a day temporarily. I would suggest a repeat upper endoscopy within a year.   02/01/2012 Lina Sar

## 2012-02-01 NOTE — Patient Instructions (Addendum)
We have sent the following medications to your pharmacy for you to pick up at your convenience: Nexium Paxil Domperidone  CC: Dr Birdie Sons

## 2012-02-13 ENCOUNTER — Other Ambulatory Visit: Payer: Self-pay | Admitting: Internal Medicine

## 2012-02-24 ENCOUNTER — Other Ambulatory Visit: Payer: Self-pay | Admitting: Orthopedic Surgery

## 2012-02-24 DIAGNOSIS — M48061 Spinal stenosis, lumbar region without neurogenic claudication: Secondary | ICD-10-CM

## 2012-02-27 ENCOUNTER — Ambulatory Visit
Admission: RE | Admit: 2012-02-27 | Discharge: 2012-02-27 | Disposition: A | Discharge status: Home / Self Care | Payer: 59 | Source: Ambulatory Visit | Attending: Orthopedic Surgery | Admitting: Orthopedic Surgery

## 2012-02-27 VITALS — BP 123/83 | HR 76

## 2012-02-27 DIAGNOSIS — M48061 Spinal stenosis, lumbar region without neurogenic claudication: Secondary | ICD-10-CM

## 2012-02-27 MED ORDER — IOHEXOL 180 MG/ML  SOLN
18.0000 mL | Freq: Once | INTRAMUSCULAR | Status: AC | PRN
  Administered 2012-02-27: 18 mL via INTRATHECAL

## 2012-02-27 MED ORDER — ONDANSETRON HCL 4 MG/2ML IJ SOLN: 4.0000 mg | Freq: Four times a day (QID) | INTRAMUSCULAR | Status: DC | PRN

## 2012-02-27 MED ORDER — DIAZEPAM 5 MG PO TABS
10.0000 mg | ORAL_TABLET | Freq: Once | ORAL | Status: AC
  Administered 2012-02-27: 10 mg via ORAL

## 2012-02-27 MED ORDER — HYDROCODONE-ACETAMINOPHEN 5-325 MG PO TABS
2.0000 | ORAL_TABLET | Freq: Once | ORAL | Status: AC
  Administered 2012-02-27: 2 via ORAL

## 2012-02-27 NOTE — Progress Notes (Signed)
Pt states has been off paxil and promethazine for the past 2 days.

## 2012-03-05 ENCOUNTER — Other Ambulatory Visit: Payer: Self-pay | Admitting: Internal Medicine

## 2012-04-06 ENCOUNTER — Other Ambulatory Visit: Payer: Self-pay | Admitting: Internal Medicine

## 2012-04-30 IMAGING — CT CT L SPINE W/ CM
4 of 11 series · 13 of 34 positions shown, 14 images · non-contrast
Comparison: None.

CLINICAL DATA: Low back pain.  Right lower extremity pain.

CT MYELOGRAPHY LUMBAR SPINE
TECHNIQUE: CT imaging of the lumbar spine was performed after
intrathecal contrast administration.  Multiplanar CT image
reconstructions were also generated.

[Series 2: l spine bone · axial · 0.27mm/px · z∈[-214,-129]mm · 2 of 103 slices shown, 3 images]
[im 35/103  soft-tissue]
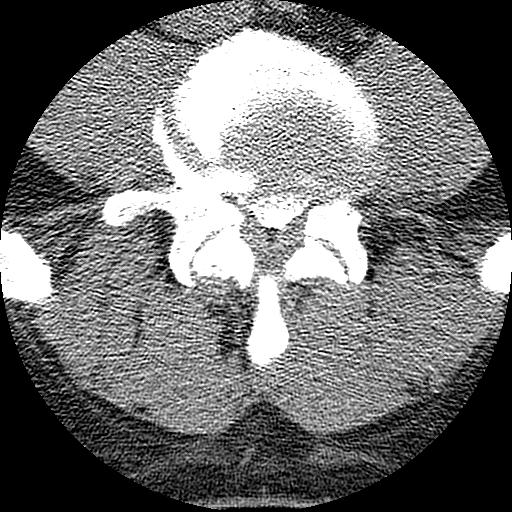
[im 35/103  bone]
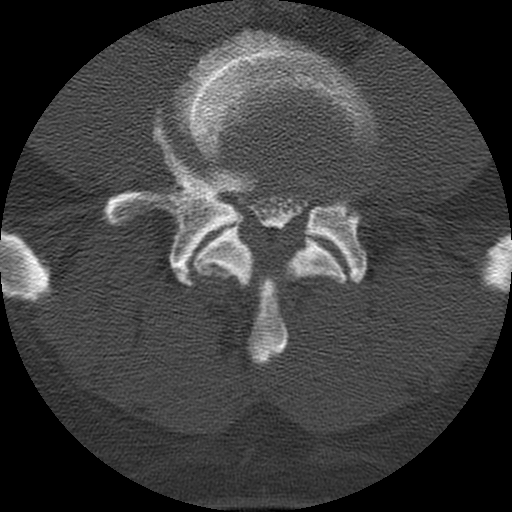
[im 69/103  bone]
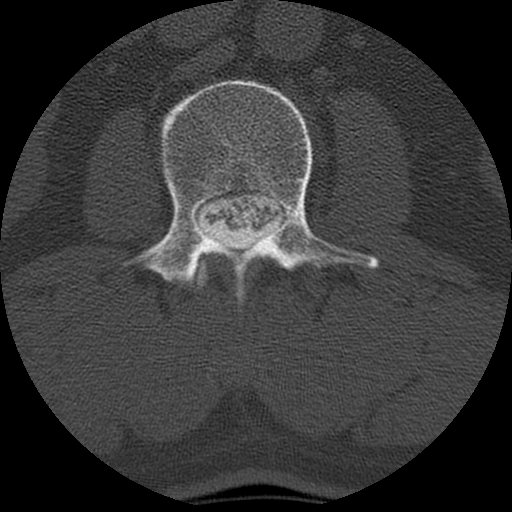

[Series 3: l spine soft · axial · 0.27mm/px · z∈[-214,-129]mm · 2 of 103 slices shown]
[im 35/103  soft-tissue]
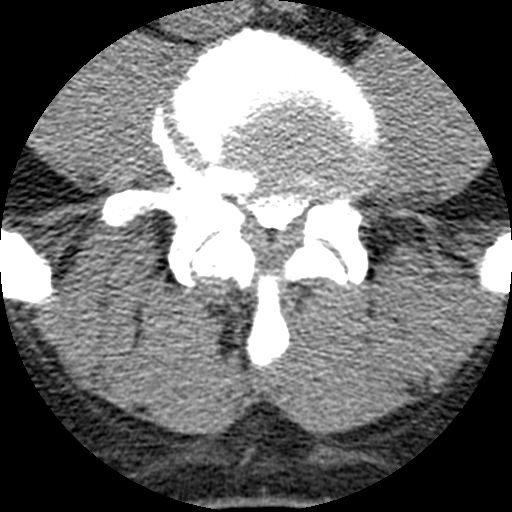
[im 69/103  soft-tissue]
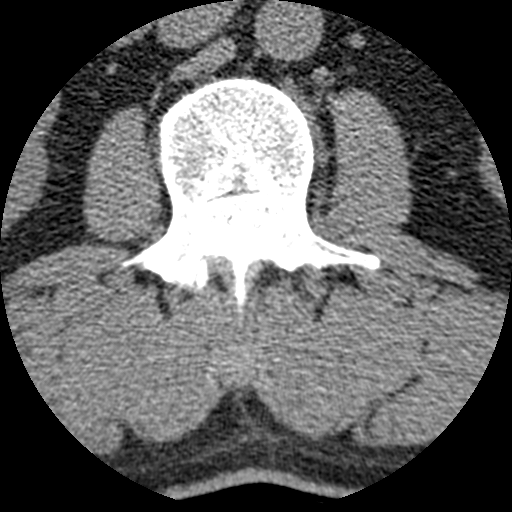

[Series 103: cor · coronal · 0.51mm/px · 6 of 50 slices shown]
[im 8/50  soft-tissue]
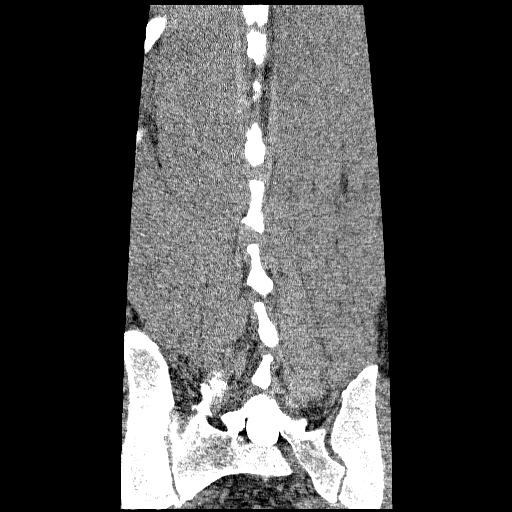
[im 9/50  bone]
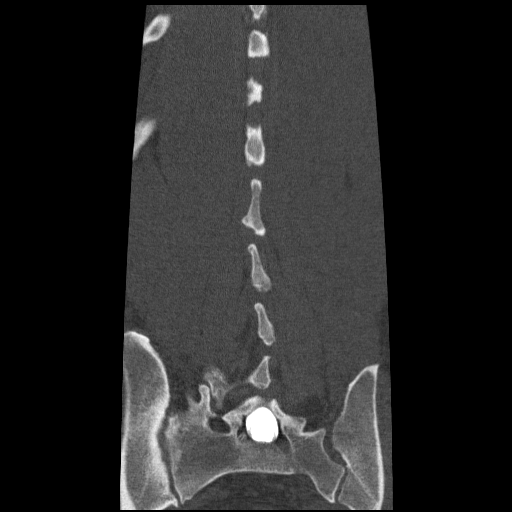
[im 17/50  bone]
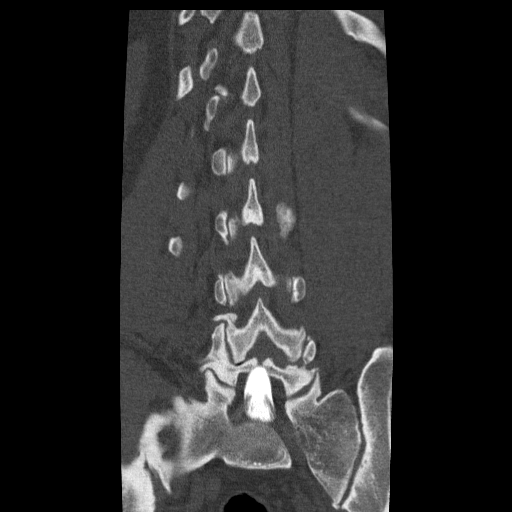
[im 25/50  bone]
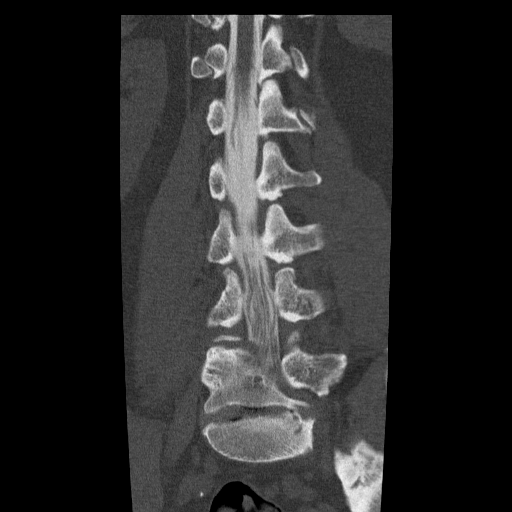
[im 33/50  bone]
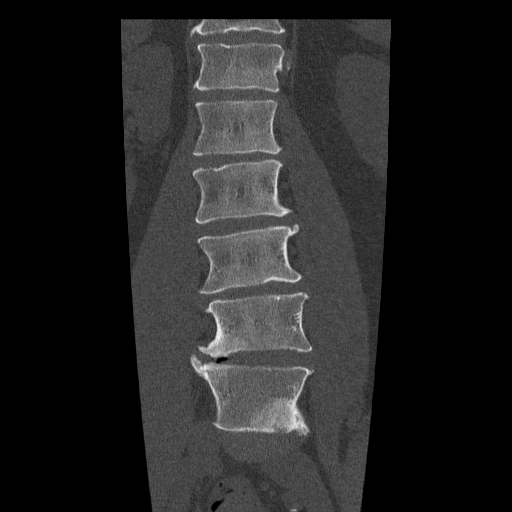
[im 41/50  bone]
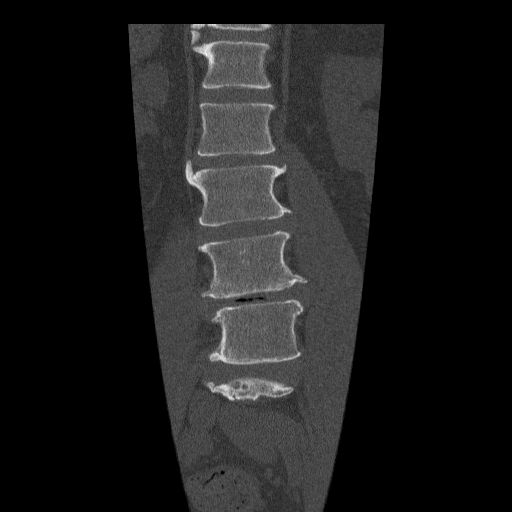

[Series 104: cor/lower · coronal · 0.27mm/px · 3 of 50 slices shown]
[im 10/50  bone]
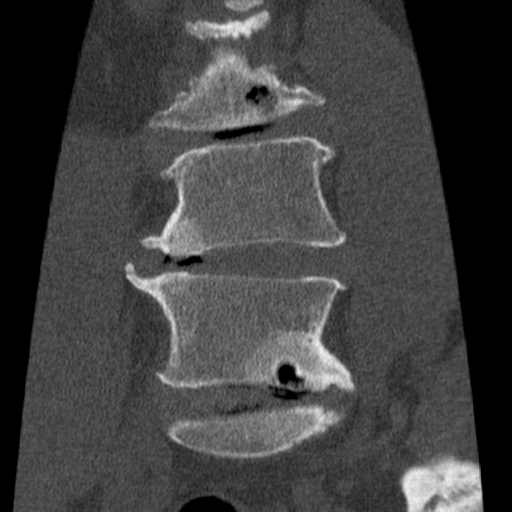
[im 20/50  bone]
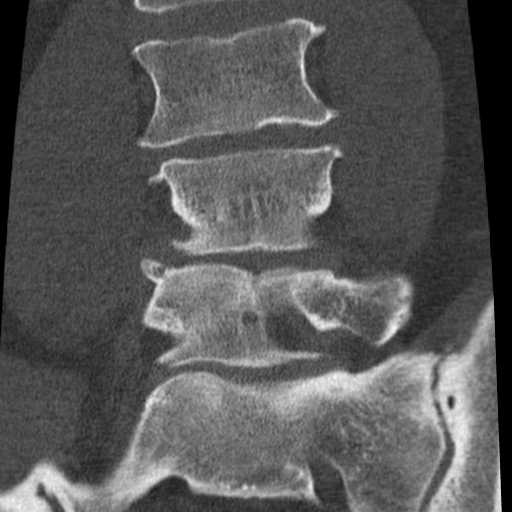
[im 30/50  bone]
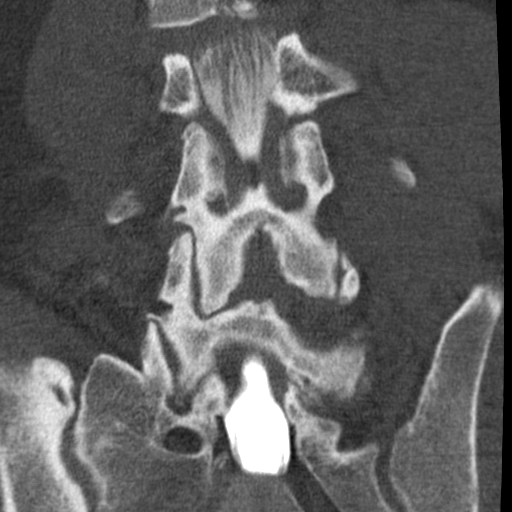

[13 of 34 positions shown; findings below may reference images not displayed]

FINDINGS: Levoscoliosis occurs at L4-5.  No vertebral compression
deformity.  No pars defect.  Conus medullaris terminates at the L2
superior endplate.

T12-L1:  Unremarkable

L1-L2:  Right paracentral disc protrusion effaces the thecal sac
without lateral recess or central stenosis.

L2-3:  Posterior bulge effaces the anterior thecal sac and extends
into both foramina.  Mild central stenosis.  Foramina patent.  Mild
facet arthropathy.

L3-4:  Posterior disc bulge effaces the anterior thecal sac with
mild ligamentum flavum hypertrophy.  Moderate lateral recess
narrowing.  Mild central stenosis.  Moderate narrowing of the disc.
Mild foraminal narrowing right greater than left.

L4-5:  Posterior disc bulge asymmetric to the right.  Mild central
stenosis.  Moderate right lateral recess stenosis.  Moderate right
foraminal narrowing primarily due to disc osteophytes and disc
space narrowing.  Left foramen is patent.  Dextroscoliosis
contributes to right-sided narrowing.  Moderate right facet
arthropathy.

L5-S1:  Right foraminal disc protrusion and osteophyte complex.
Left paracentral and foraminal protrusion and osteophyte complex
are both present at this level.  This results in moderate left
lateral recess narrowing and S1 nerve root gross mass on the left,
mild right foramen narrowing, and moderate left foraminal
narrowing.  Mild right-sided facet arthropathy.
IMPRESSION: Multilevel degenerative changes as described involving all lumbar
spine levels.

Right paracentral protrusion at L1-2.

Mild central stenosis at L2-3.

Mild central stenosis at L3-4.

Significant right foraminal narrowing at L4-5 as well as moderate
right lateral recess narrowing.

Right and left protrusions at L5-S1 as described with some degree
of narrowing.

## 2012-05-01 ENCOUNTER — Other Ambulatory Visit: Payer: Self-pay | Admitting: *Deleted

## 2012-05-01 MED ORDER — BUTALBITAL-APAP-CAFFEINE 50-325-40 MG PO TABS: ORAL_TABLET | ORAL | Status: DC

## 2012-05-02 ENCOUNTER — Encounter: Payer: Self-pay | Admitting: Internal Medicine

## 2012-05-31 ENCOUNTER — Telehealth: Payer: Self-pay | Admitting: *Deleted

## 2012-05-31 ENCOUNTER — Other Ambulatory Visit: Payer: Self-pay | Admitting: Orthopedic Surgery

## 2012-05-31 DIAGNOSIS — M79605 Pain in left leg: Secondary | ICD-10-CM

## 2012-05-31 DIAGNOSIS — M549 Dorsalgia, unspecified: Secondary | ICD-10-CM

## 2012-05-31 NOTE — Telephone Encounter (Signed)
Prior authorization was requested for patient's Nexium twice daily. However, per [facility], rx has been denied at this time. Patient must have tried Lansoprazole, omeprazole, Zegerid or pantoprazole for at least 30 days prior to any approval for Nexium. Per our records, patient has only tried Nexium. I have left a message for patient to call back.

## 2012-06-01 MED ORDER — OMEPRAZOLE 40 MG PO CPDR: 40.0000 mg | DELAYED_RELEASE_CAPSULE | Freq: Two times a day (BID) | ORAL | Status: DC

## 2012-06-01 NOTE — Telephone Encounter (Signed)
I have spoken to patient to advise that his insurance company requests him to try another PPI prior to them continuing coverage for Nexium. Unfortunately, there is no documentation of previous PPI's tried. Therefore, we will give patient 1 month rx of omeprazole 40 mg twice daily. Patient will call us back with an update as to whether the omeprazole works of whether he needs to go back on Nexium.

## 2012-06-05 ENCOUNTER — Other Ambulatory Visit: Payer: 59

## 2012-06-13 ENCOUNTER — Other Ambulatory Visit: Payer: Self-pay | Admitting: Internal Medicine

## 2012-06-21 ENCOUNTER — Other Ambulatory Visit: Payer: Self-pay | Admitting: Internal Medicine

## 2012-07-05 ENCOUNTER — Encounter: Payer: Self-pay | Admitting: Internal Medicine

## 2012-07-05 ENCOUNTER — Ambulatory Visit (AMBULATORY_SURGERY_CENTER): Payer: 59 | Admitting: *Deleted

## 2012-07-05 VITALS — Ht 71.0 in | Wt 214.6 lb

## 2012-07-05 DIAGNOSIS — Z1211 Encounter for screening for malignant neoplasm of colon: Secondary | ICD-10-CM

## 2012-07-05 MED ORDER — MOVIPREP 100 G PO SOLR: 1.0000 | Freq: Once | ORAL | Status: DC

## 2012-07-05 NOTE — Progress Notes (Signed)
No allergy to eggs or soy. ewm No problems with sedation in the past. ewm No home oxygen use. ewm Pt will need a note for work the day before the procedure 07-18-12  because of his job and only having clear liquids he cannot work at his job and do this and the day of his procedure as well 07-19-12. ewm

## 2012-07-13 ENCOUNTER — Other Ambulatory Visit: Payer: Self-pay | Admitting: Internal Medicine

## 2012-07-16 ENCOUNTER — Other Ambulatory Visit: Payer: Self-pay | Admitting: Internal Medicine

## 2012-07-19 ENCOUNTER — Encounter: Payer: Self-pay | Admitting: Internal Medicine

## 2012-07-19 ENCOUNTER — Ambulatory Visit (AMBULATORY_SURGERY_CENTER): Payer: 59 | Admitting: Internal Medicine

## 2012-07-19 VITALS — BP 130/76 | HR 64 | Temp 97.3°F | Resp 11 | Ht 71.0 in | Wt 214.0 lb

## 2012-07-19 DIAGNOSIS — Z1211 Encounter for screening for malignant neoplasm of colon: Secondary | ICD-10-CM

## 2012-07-19 DIAGNOSIS — D126 Benign neoplasm of colon, unspecified: Secondary | ICD-10-CM

## 2012-07-19 MED ORDER — SODIUM CHLORIDE 0.9 % IV SOLN: 500.0000 mL | INTRAVENOUS | Status: DC

## 2012-07-19 NOTE — Patient Instructions (Addendum)
YOU HAD AN ENDOSCOPIC PROCEDURE TODAY AT THE [facility]: Refer to the procedure report that was given to you for any specific questions about what was found during the examination.  If the procedure report does not answer your questions, please call your gastroenterologist to clarify.  If you requested that your care partner not be given the details of your procedure findings, then the procedure report has been included in a sealed envelope for you to review at your convenience later.  YOU SHOULD EXPECT: Some feelings of bloating in the abdomen. Passage of more gas than usual.  Walking can help get rid of the air that was put into your GI tract during the procedure and reduce the bloating. If you had a lower endoscopy (such as a colonoscopy or flexible sigmoidoscopy) you may notice spotting of blood in your stool or on the toilet paper. If you underwent a bowel prep for your procedure, then you may not have a normal bowel movement for a few days.  DIET: Your first meal following the procedure should be a light meal and then it is ok to progress to your normal diet.  A half-sandwich or bowl of soup is an example of a good first meal.  Heavy or fried foods are harder to digest and may make you feel nauseous or bloated.  Likewise meals heavy in dairy and vegetables can cause extra gas to form and this can also increase the bloating.  Drink plenty of fluids but you should avoid alcoholic beverages for 24 hours.  ACTIVITY: Your care partner should take you home directly after the procedure.  You should plan to take it easy, moving slowly for the rest of the day.  You can resume normal activity the day after the procedure however you should NOT DRIVE or use heavy machinery for 24 hours (because of the sedation medicines used during the test).    SYMPTOMS TO REPORT IMMEDIATELY: A gastroenterologist can be reached at any hour.  During normal business hours, 8:30 AM to 5:00 PM Monday through Friday,  call [phone].  After hours and on weekends, please call the GI answering service at [phone]  Emergency number who will take a message and have the physician on call contact you.   Following lower endoscopy (colonoscopy or flexible sigmoidoscopy):  Excessive amounts of blood in the stool  Significant tenderness or worsening of abdominal pains  Swelling of the abdomen that is new, acute  Fever of 100F or higher  FOLLOW UP: If any biopsies were taken you will be contacted by phone or by letter within the next 1-3 weeks.  Call your gastroenterologist if you have not heard about the biopsies in 3 weeks.  Our staff will call the home number listed on your records the next business day following your procedure to check on you and address any questions or concerns that you may have at that time regarding the information given to you following your procedure. This is a courtesy call and so if there is no answer at the home number and we have not heard from you through the emergency physician on call, we will assume that you have returned to your regular daily activities without incident.  SIGNATURES/CONFIDENTIALITY: You and/or your care partner have signed paperwork which will be entered into your electronic medical record.  These signatures attest to the fact that that the information above on your After Visit Summary has been reviewed and is understood.  Full responsibility of the   confidentiality of this discharge information lies with you and/or your care-partner.  Handout on polyps and high fiber diet.   Please do not take any aspirin, aspirin containing products, or NSAIDS like motrin, aleve, advil for 2 weeks. Please take tylenol only as needed for pain. You may resume these medicines on June 5th, 2014.

## 2012-07-19 NOTE — Progress Notes (Signed)
Report to pacu rn, vss, bbs=clear 

## 2012-07-19 NOTE — Progress Notes (Signed)
Called to room to assist during endoscopic procedure.  Patient ID and intended procedure confirmed with present staff. Received instructions for my participation in the procedure from the performing physician.  

## 2012-07-19 NOTE — Progress Notes (Signed)
Patient did not experience any of the following events: a burn prior to discharge; a fall within the facility; wrong site/side/patient/procedure/implant event; or a hospital transfer or hospital admission upon discharge from the facility. (G8907)Patient did not have preoperative order for IV antibiotic SSI prophylaxis. (G8918) ewm 

## 2012-07-19 NOTE — Op Note (Signed)
[facility] [address] [place], [address]   COLONOSCOPY PROCEDURE REPORT  PATIENT: Mark, [name]  MR#: [id number] BIRTHDATE: Sep 28, 1950 , 61  yrs. old GENDER: Male ENDOSCOPIST: Hart Carwin, MD REFERRED BY:  recall colonoscopy PROCEDURE DATE:  07/19/2012 PROCEDURE:   Colonoscopy, screening ASA CLASS:   Class II INDICATIONS:Average risk patient for colon cancer and last colonoscopy 2004. MEDICATIONS: MAC sedation, administered by CRNA and propofol (Diprivan) 250mg  IV  DESCRIPTION OF PROCEDURE:   After the risks and benefits and of the procedure were explained, informed consent was obtained.  A digital rectal exam revealed no abnormalities of the rectum.    The LB [id number]  endoscope was introduced through the anus and advanced to the cecum, which was identified by both the appendix and ileocecal valve .  The quality of the prep was good, using MoviPrep .  The instrument was then slowly withdrawn as the colon was fully examined.     COLON FINDINGS: Three sessile polyps were found in the left colon. at 70cm and two at 20 cm, each measured 5-8 mm, cold snare was used to remove all polyps, which were recovered,    Retroflexed views revealed no abnormalities.     The scope was then withdrawn from the patient and the procedure completed.  COMPLICATIONS: There were no complications. ENDOSCOPIC IMPRESSION: Three sessile polyps were found in the left colon , sp/ cold snare polypectomy  RECOMMENDATIONS: Await pathology results no ASA x 2 weeks high fiber diet   REPEAT EXAM: In 5 year(s)  for Colonoscopy.  cc:  _______________________________ eSignedHart Carwin, MD 07/19/2012 9:22 AM     PATIENT NAME:  Mark, [name] MR#: [id number]

## 2012-07-20 ENCOUNTER — Telehealth: Payer: Self-pay | Admitting: *Deleted

## 2012-07-20 NOTE — Telephone Encounter (Signed)
  Follow up Call-  Call back number 07/19/2012  Post procedure Call Back phone  # [phone]  Permission to leave phone message Yes     Patient questions:  Do you have a fever, pain , or abdominal swelling? no Pain Score  0 *  Have you tolerated food without any problems? yes  Have you been able to return to your normal activities? yes  Do you have any questions about your discharge instructions: Diet   yes Medications  no Follow up visit  no  Do you have questions or concerns about your Care? no  Actions: * If pain score is 4 or above: No action needed, pain <4. Discussed adding fiber to diet in small increments and experimenting with high fiber foods that pts likes to eat.  He believes that  Regular milk  Causes bloating and has switched to lactose free.

## 2012-07-26 ENCOUNTER — Encounter: Payer: Self-pay | Admitting: Internal Medicine

## 2012-08-05 ENCOUNTER — Other Ambulatory Visit: Payer: Self-pay | Admitting: Internal Medicine

## 2012-08-25 ENCOUNTER — Other Ambulatory Visit: Payer: Self-pay | Admitting: Internal Medicine

## 2012-09-07 ENCOUNTER — Other Ambulatory Visit: Payer: Self-pay | Admitting: Internal Medicine

## 2012-09-07 ENCOUNTER — Other Ambulatory Visit: Payer: Self-pay | Admitting: *Deleted

## 2012-09-07 MED ORDER — SIMVASTATIN 40 MG PO TABS: ORAL_TABLET | ORAL | Status: DC

## 2012-09-07 MED ORDER — OMEPRAZOLE 40 MG PO CPDR: DELAYED_RELEASE_CAPSULE | ORAL | Status: DC

## 2012-09-07 NOTE — Telephone Encounter (Signed)
Pt needs refills on simvastatin 40 mg #90  Also omeprazole 40 mg #180  with 3 refills. Pt takes omeprazole 40 mg twice a day.

## 2012-09-07 NOTE — Telephone Encounter (Signed)
done

## 2012-11-26 ENCOUNTER — Other Ambulatory Visit: Payer: Self-pay | Admitting: Internal Medicine

## 2012-12-06 ENCOUNTER — Encounter: Payer: Self-pay | Admitting: *Deleted

## 2012-12-06 ENCOUNTER — Telehealth: Payer: Self-pay | Admitting: Internal Medicine

## 2012-12-06 NOTE — Telephone Encounter (Signed)
Pt new rx fioricet #90 sent into [facility]. Pt has cpx sch for 03-22-13

## 2012-12-10 MED ORDER — BUTALBITAL-APAP-CAFFEINE 50-325-40 MG PO TABS: ORAL_TABLET | ORAL | Status: DC

## 2012-12-10 NOTE — Telephone Encounter (Signed)
rx called in

## 2013-01-11 ENCOUNTER — Other Ambulatory Visit: Payer: Self-pay | Admitting: Internal Medicine

## 2013-01-22 ENCOUNTER — Ambulatory Visit (INDEPENDENT_AMBULATORY_CARE_PROVIDER_SITE_OTHER): Payer: 59 | Admitting: Internal Medicine

## 2013-01-22 ENCOUNTER — Encounter: Payer: Self-pay | Admitting: Internal Medicine

## 2013-01-22 VITALS — BP 120/82 | HR 68 | Ht 71.0 in | Wt 215.8 lb

## 2013-01-22 DIAGNOSIS — K219 Gastro-esophageal reflux disease without esophagitis: Secondary | ICD-10-CM

## 2013-01-22 DIAGNOSIS — K3184 Gastroparesis: Secondary | ICD-10-CM

## 2013-01-22 MED ORDER — PAROXETINE HCL 20 MG PO TABS: 20.0000 mg | ORAL_TABLET | Freq: Every day | ORAL | Status: DC

## 2013-01-22 MED ORDER — ONDANSETRON HCL 4 MG PO TABS: ORAL_TABLET | ORAL | Status: DC

## 2013-01-22 MED ORDER — PROMETHAZINE HCL 25 MG PO TABS: ORAL_TABLET | ORAL | Status: DC

## 2013-01-22 MED ORDER — OMEPRAZOLE 40 MG PO CPDR: DELAYED_RELEASE_CAPSULE | ORAL | Status: DC

## 2013-01-22 MED ORDER — AMBULATORY NON FORMULARY MEDICATION: Status: DC

## 2013-01-22 NOTE — Progress Notes (Addendum)
Mark [name] January 11, 1951 MRN [id number]  History of Present Illness:  This is a 62 year old African American male with gastroparesis, gastroesophageal reflux and chronic nausea which has been well-controlled on Zofran 4 mg, omeprazole 40 mg twice a day, and domperidone 10 mg 3 times a day (receives from a pharmacy in [place]). He comes today for refills of medications. He needs 90 day supplies. A colonoscopy in May 2014 showed multiple polyps, both tubular adenoma and hyperplastic. He will be due for a recall colonoscopy in 5 years. His last upper endoscopy in February 2011 showed gastritis and esophagitis. A gastric emptying scan in February 2011 showed 36% retention in 2 hours.  A HIDA scan showed a 65% ejection fraction. He has a history of back surgery and a radical prostatectomy, followed by Dr Laverle Patter.   Past Medical History  Diagnosis Date  . HYPERTENSION 03/07/2007  . Headache(784.0) 03/07/2007  . PROSTATE CANCER, HX OF 03/07/2007  . ASTHMA 03/12/2007  . HYPERLIPIDEMIA 06/11/2007  . GERD 04/02/2009  . HIATAL HERNIA 04/28/2009  . TRANSAMINASES, SERUM, ELEVATED 04/28/2009  . Gastroparesis 05/01/2009  . Anxiety state, unspecified 09/01/2009  . Hiatal hernia   . Ulcer     as teenager  . Tubular adenoma of colon   . Polyp of colon, hyperplastic    Past Surgical History  Procedure Laterality Date  . Prostatectomy  07/10/06  . Appendectomy    . Rotator cuff repair      9/09 on right  . Back surgery  05/2011  . Colonoscopy      reports that he quit smoking about 29 years ago. His smoking use included Cigarettes. He has a 1 pack-year smoking history. He has never used smokeless tobacco. He reports that he does not drink alcohol or use illicit drugs. family history includes Hypertension in his brother and father; Pancreatic cancer (age of onset: 14) in his mother. There is no history of Colon cancer, Rectal cancer, or Stomach cancer. Allergies  Allergen Reactions  . Propoxyphene Hcl Nausea And  Vomiting             Review of Systems: Denies heartburn chest pain abdominal pain  The remainder of the 10 point ROS is negative except as outlined in H&P   Physical Exam: General appearance  Well developed, in no distress. Eyes- non icteric. HEENT nontraumatic, normocephalic. Mouth no lesions, tongue papillated, no cheilosis. Neck supple without adenopathy, thyroid not enlarged, no carotid bruits, no JVD. Lungs Clear to auscultation bilaterally. Cor normal S1, normal S2, regular rhythm, no murmur, quiet precordium. Abdomen: Soft minimally tender in epigastrium. Normoactive bowel sounds. No mass. No tympany. No distention. Rectal: Not done. Extremities no pedal edema. Skin no lesions. Neurological alert and oriented x 3. Psychological normal mood and affect.  Assessment and Plan:  Problem #74 62 year old [place] American male with mild gastroparesis. He is doing well on a combination of domperidone, Zofran and omeprazole. We will see him in one year. Consider repeat upper endoscopy at some point  Problem #2 History of adenomatous polyps. Patient's last colonoscopy was in May 2014. A recall colonoscopy will be due in 5 years.  Problem #3 anxiety and depression. Patient is currently on Paxil 20 mg daily. He wants to stay on it.    01/22/2013 Lina Sar

## 2013-01-22 NOTE — Patient Instructions (Addendum)
We have sent the following medications to your pharmacy for you to pick up at your convenience: Zofran Phenergan Omeprazole Paxil  We have sent a prescription to [facility] pharmacy for your domperidone.  Please follow up with Dr Juanda Chance in 1 year.  CC: Dr Cato Mulligan, Dr Laverle Patter

## 2013-01-26 ENCOUNTER — Emergency Department (INDEPENDENT_AMBULATORY_CARE_PROVIDER_SITE_OTHER)
Admission: EM | Admit: 2013-01-26 | Discharge: 2013-01-26 | Disposition: A | Discharge status: Home / Self Care | Payer: 59 | Source: Home / Self Care

## 2013-01-26 ENCOUNTER — Encounter (HOSPITAL_COMMUNITY): Payer: Self-pay | Admitting: Emergency Medicine

## 2013-01-26 DIAGNOSIS — R05 Cough: Secondary | ICD-10-CM

## 2013-01-26 DIAGNOSIS — J9801 Acute bronchospasm: Secondary | ICD-10-CM

## 2013-01-26 DIAGNOSIS — R059 Cough, unspecified: Secondary | ICD-10-CM

## 2013-01-26 DIAGNOSIS — R0982 Postnasal drip: Secondary | ICD-10-CM

## 2013-01-26 DIAGNOSIS — J069 Acute upper respiratory infection, unspecified: Secondary | ICD-10-CM

## 2013-01-26 MED ORDER — METHYLPREDNISOLONE 4 MG PO KIT: PACK | ORAL | Status: DC

## 2013-01-26 NOTE — ED Provider Notes (Signed)
CSN: [id number]     Arrival date & time 01/26/13  1045 History   First MD Initiated Contact with Patient 01/26/13 1130     Chief Complaint  Patient presents with  . Cough   (Consider location/radiation/quality/duration/timing/severity/associated sxs/prior Treatment) HPI Comments: 62 year old male complaining of cough and nasal congestion his chief complaint. Associated with PND. He states his cough is dry but he feels a rattling in his upper chest. Has been using albuterol but when he describes no uses it is incorrect. He is also using Flonase and his last and in association with Sudafed PE tablets and he continues to cough and nasal congestion.   Past Medical History  Diagnosis Date  . HYPERTENSION 03/07/2007  . Headache(784.0) 03/07/2007  . PROSTATE CANCER, HX OF 03/07/2007  . ASTHMA 03/12/2007  . HYPERLIPIDEMIA 06/11/2007  . GERD 04/02/2009  . HIATAL HERNIA 04/28/2009  . TRANSAMINASES, SERUM, ELEVATED 04/28/2009  . Gastroparesis 05/01/2009  . Anxiety state, unspecified 09/01/2009  . Hiatal hernia   . Ulcer     as teenager  . Tubular adenoma of colon   . Polyp of colon, hyperplastic    Past Surgical History  Procedure Laterality Date  . Prostatectomy  07/10/06  . Appendectomy    . Rotator cuff repair      9/09 on right  . Back surgery  05/2011  . Colonoscopy     Family History  Problem Relation Age of Onset  . Pancreatic cancer Mother 2  . Hypertension Father   . Hypertension Brother     x 2  . Colon cancer Neg Hx   . Rectal cancer Neg Hx   . Stomach cancer Neg Hx    History  Substance Use Topics  . Smoking status: Former Smoker -- 0.50 packs/day for 2 years    Types: Cigarettes    Quit date: 04/09/1983  . Smokeless tobacco: Never Used  . Alcohol Use: No    Review of Systems  Constitutional: Positive for activity change. Negative for fever and fatigue.  HENT: Positive for congestion, postnasal drip, sinus pressure and sore throat. Negative for ear pain, rhinorrhea and  trouble swallowing.   Eyes: Negative.   Respiratory: Negative for shortness of breath.   Cardiovascular: Negative.   Gastrointestinal: Negative.   Genitourinary: Negative.   Musculoskeletal: Negative.   Skin: Negative.     Allergies  Propoxyphene hcl  Home Medications   Current Outpatient Rx  Name  Route  Sig  Dispense  Refill  . AMBULATORY NON FORMULARY MEDICATION      Medication Name: Domperidone 10 mg Take 1 tablet by mouth three times daily   270 tablet   3   . azelastine (ASTELIN) 137 MCG/SPRAY nasal spray      USE 2 PUFFS EACH NOSTRIL DAILY   30 mL   5   . butalbital-acetaminophen-caffeine (FIORICET, ESGIC) 50-325-40 MG per tablet      TAKE 1 TABLET BY MOUTH EVERY DAY AS NEEDED   30 tablet   2   . diclofenac (VOLTAREN) 75 MG EC tablet   Oral   Take 75 mg by mouth 2 (two) times daily.         . fexofenadine (ALLEGRA) 180 MG tablet   Oral   Take 180 mg by mouth daily.         . fluticasone (FLONASE) 50 MCG/ACT nasal spray      USE ONE SPRAY AS DIRECTED ONCE DAILY   48 g   3   .  methocarbamol (ROBAXIN) 500 MG tablet   Oral   Take 500 mg by mouth daily.         [name] Kitchen omeprazole (PRILOSEC) 40 MG capsule      TAKE ONE CAPSULE TWICE A DAY   180 capsule   3   . ondansetron (ZOFRAN) 4 MG tablet      TAKE 1 TABLET EVERY 8 HOURS AS NEEDED   90 tablet   3   . PARoxetine (PAXIL) 20 MG tablet   Oral   Take 1 tablet (20 mg total) by mouth daily.   90 tablet   3   . pregabalin (LYRICA) 50 MG capsule   Oral   Take 50 mg by mouth 3 (three) times daily.         [name] Kitchen PRESCRIPTION MEDICATION   Injection   Inject 1 mL as directed See admin instructions. Trimix -PGE 20,PAPAVERINE 30,PHENTOLAMINE 0.5-AS NEEDED FOR ERECTILE DYSFUNCTION         . PROAIR HFA 108 (90 BASE) MCG/ACT inhaler      INHALE 2 PUFFS 4 TIMES A DAY AS DIRECTED   8.5 each   0     NEEDS OV   . promethazine (PHENERGAN) 25 MG tablet      TAKE 1 TABLET (25 MG TOTAL) BY MOUTH  EVERY 6 (SIX) HOURS AS NEEDED FOR NAUSEA   90 tablet   3   . propranolol-hydrochlorothiazide (INDERIDE) 40-25 MG per tablet      TAKE 1 TABLET BY MOUTH TWICE A DAY   180 tablet   2   . simvastatin (ZOCOR) 40 MG tablet      TAKE ONE TABLET AT BEDTIME   90 tablet   3   . methylPREDNISolone (MEDROL DOSEPAK) 4 MG tablet      follow package directions   21 tablet   0    BP 143/89  Pulse 79  Temp(Src) 98.2 F (36.8 C) (Oral)  Resp 18  SpO2 95% Physical Exam  Nursing note and vitals reviewed. Constitutional: He is oriented to person, place, and time. He appears well-developed and well-nourished. No distress.  HENT:  Mouth/Throat: No oropharyngeal exudate.  Bilateral TMs are normal Oropharynx is clear, moist with clear PND.  Eyes: Conjunctivae and EOM are normal.  Neck: Normal range of motion. Neck supple.  Cardiovascular: Normal rate, regular rhythm and normal heart sounds.   Pulmonary/Chest: Effort normal. He has wheezes.  After attempting to explain and demonstrated how to take a deep breath in and out forcefully the patient was never able to follow instructions and perform the maneuver best for hearing adventitious sounds. Upon cough he does have coarseness bilaterally.  Musculoskeletal: He exhibits no edema and no tenderness.  Lymphadenopathy:    He has no cervical adenopathy.  Neurological: He is alert and oriented to person, place, and time. He exhibits normal muscle tone.  Skin: Skin is warm and dry. No rash noted.  Psychiatric: He has a normal mood and affect.    ED Course  Procedures (including critical care time) Labs Review Labs Reviewed - No data to display Imaging Review No results found.    MDM   1. URI (upper respiratory infection)   2. Cough   3. Bronchospasm   4. PND (post-nasal drip)      Cont azelastine and flonase and sudafed PE. Start medrol dose pack and neosynephrine nasal spray Use Albuterol as demonstrated for better  results Saline nasal spray C  Hayden Rasmussen, NP 01/26/13 1249  Hayden Rasmussen, NP 01/26/13 1253

## 2013-01-26 NOTE — ED Notes (Signed)
C/o cough, congestion, drainage x 4 weeks

## 2013-01-26 NOTE — ED Provider Notes (Signed)
Medical screening examination/treatment/procedure(s) were performed by a resident physician or non-physician practitioner and as the supervising physician I was immediately available for consultation/collaboration.  Evan Corey, MD    Evan S Corey, MD 01/26/13 1911 

## 2013-02-24 ENCOUNTER — Other Ambulatory Visit: Payer: Self-pay | Admitting: Internal Medicine

## 2013-03-14 ENCOUNTER — Emergency Department (INDEPENDENT_AMBULATORY_CARE_PROVIDER_SITE_OTHER)
Admission: EM | Admit: 2013-03-14 | Discharge: 2013-03-14 | Disposition: A | Discharge status: Home / Self Care | Payer: 59 | Source: Home / Self Care | Attending: Family Medicine | Admitting: Family Medicine

## 2013-03-14 ENCOUNTER — Encounter (HOSPITAL_COMMUNITY): Payer: Self-pay | Admitting: Emergency Medicine

## 2013-03-14 DIAGNOSIS — R05 Cough: Secondary | ICD-10-CM

## 2013-03-14 DIAGNOSIS — R059 Cough, unspecified: Secondary | ICD-10-CM

## 2013-03-14 MED ORDER — BENZONATATE 100 MG PO CAPS: 100.0000 mg | ORAL_CAPSULE | Freq: Three times a day (TID) | ORAL | Status: DC | PRN

## 2013-03-14 NOTE — ED Provider Notes (Signed)
CSN: [id number]     Arrival date & time 03/14/13  [id number] History   First MD Initiated Contact with Patient 03/14/13 0827     Chief Complaint  Patient presents with  . Cough   (Consider location/radiation/quality/duration/timing/severity/associated sxs/prior Treatment) Patient is a 63 y.o. male presenting with cough. The history is provided by the patient.  Cough Cough characteristics:  Dry Severity:  Mild Onset quality:  Gradual Duration:  4 days Timing:  Intermittent Progression:  Waxing and waning Chronicity:  New Smoker: no   Context: sick contacts   Context comment:  +family members with URi sx at home Associated symptoms: rhinorrhea   Associated symptoms: no chest pain, no chills, no diaphoresis, no ear fullness, no ear pain, no eye discharge, no fever, no headaches, no myalgias, no rash, no shortness of breath, no sinus congestion, no sore throat, no weight loss and no wheezing     Past Medical History  Diagnosis Date  . HYPERTENSION 03/07/2007  . Headache(784.0) 03/07/2007  . PROSTATE CANCER, HX OF 03/07/2007  . ASTHMA 03/12/2007  . HYPERLIPIDEMIA 06/11/2007  . GERD 04/02/2009  . HIATAL HERNIA 04/28/2009  . TRANSAMINASES, SERUM, ELEVATED 04/28/2009  . Gastroparesis 05/01/2009  . Anxiety state, unspecified 09/01/2009  . Hiatal hernia   . Ulcer     as teenager  . Tubular adenoma of colon   . Polyp of colon, hyperplastic    Past Surgical History  Procedure Laterality Date  . Prostatectomy  07/10/06  . Appendectomy    . Rotator cuff repair      9/09 on right  . Back surgery  05/2011  . Colonoscopy     Family History  Problem Relation Age of Onset  . Pancreatic cancer Mother 54  . Hypertension Father   . Hypertension Brother     x 2  . Colon cancer Neg Hx   . Rectal cancer Neg Hx   . Stomach cancer Neg Hx    History  Substance Use Topics  . Smoking status: Former Smoker -- 0.50 packs/day for 2 years    Types: Cigarettes    Quit date: 04/09/1983  . Smokeless tobacco:  Never Used  . Alcohol Use: No    Review of Systems  Constitutional: Negative for fever, chills, weight loss and diaphoresis.  HENT: Positive for rhinorrhea. Negative for ear pain and sore throat.   Eyes: Negative for discharge.  Respiratory: Positive for cough. Negative for shortness of breath and wheezing.   Cardiovascular: Negative for chest pain and leg swelling.  Gastrointestinal: Negative.   Musculoskeletal: Negative for myalgias.  Skin: Negative for rash.  Allergic/Immunologic: Negative for immunocompromised state.  Neurological: Negative for headaches.  Hematological: Negative for adenopathy.    Allergies  Propoxyphene hcl  Home Medications   Current Outpatient Rx  Name  Route  Sig  Dispense  Refill  . AMBULATORY NON FORMULARY MEDICATION      Medication Name: Domperidone 10 mg Take 1 tablet by mouth three times daily   270 tablet   3   . azelastine (ASTELIN) 137 MCG/SPRAY nasal spray      USE 2 PUFFS EACH NOSTRIL DAILY   30 mL   5   . benzonatate (TESSALON) 100 MG capsule   Oral   Take 1 capsule (100 mg total) by mouth 3 (three) times daily as needed for cough. Swallow whole   21 capsule   0   . butalbital-acetaminophen-caffeine (FIORICET, ESGIC) 50-325-40 MG per tablet      TAKE 1  TABLET BY MOUTH EVERY DAY AS NEEDED   30 tablet   2   . diclofenac (VOLTAREN) 75 MG EC tablet   Oral   Take 75 mg by mouth 2 (two) times daily.         . fexofenadine (ALLEGRA) 180 MG tablet   Oral   Take 180 mg by mouth daily.         . fluticasone (FLONASE) 50 MCG/ACT nasal spray      USE ONE SPRAY AS DIRECTED ONCE DAILY   48 g   3   . methocarbamol (ROBAXIN) 500 MG tablet   Oral   Take 500 mg by mouth daily.         . methylPREDNISolone (MEDROL DOSEPAK) 4 MG tablet      follow package directions   21 tablet   0   . omeprazole (PRILOSEC) 40 MG capsule      TAKE ONE CAPSULE TWICE A DAY   180 capsule   3   . ondansetron (ZOFRAN) 4 MG tablet       TAKE 1 TABLET EVERY 8 HOURS AS NEEDED   90 tablet   3   . PARoxetine (PAXIL) 20 MG tablet   Oral   Take 1 tablet (20 mg total) by mouth daily.   90 tablet   3   . PARoxetine (PAXIL) 20 MG tablet      TAKE 1 TABLET (20 MG TOTAL) BY MOUTH DAILY.   90 tablet   1   . pregabalin (LYRICA) 50 MG capsule   Oral   Take 50 mg by mouth 3 (three) times daily.         [name] Kitchen PRESCRIPTION MEDICATION   Injection   Inject 1 mL as directed See admin instructions. Trimix -PGE 20,PAPAVERINE 30,PHENTOLAMINE 0.5-AS NEEDED FOR ERECTILE DYSFUNCTION         . PROAIR HFA 108 (90 BASE) MCG/ACT inhaler      INHALE 2 PUFFS 4 TIMES A DAY AS DIRECTED   8.5 each   0     NEEDS OV   . promethazine (PHENERGAN) 25 MG tablet      TAKE 1 TABLET (25 MG TOTAL) BY MOUTH EVERY 6 (SIX) HOURS AS NEEDED FOR NAUSEA   90 tablet   3   . propranolol-hydrochlorothiazide (INDERIDE) 40-25 MG per tablet      TAKE 1 TABLET BY MOUTH TWICE A DAY   180 tablet   2   . simvastatin (ZOCOR) 40 MG tablet      TAKE ONE TABLET AT BEDTIME   90 tablet   3    BP 132/94  Pulse 68  Temp(Src) 98.6 F (37 C) (Oral)  Resp 18  SpO2 95% Physical Exam  Nursing note and vitals reviewed. Constitutional: He is oriented to person, place, and time. He appears well-developed and well-nourished. No distress.  HENT:  Head: Normocephalic and atraumatic.  Right Ear: Hearing, tympanic membrane, external ear and ear canal normal.  Left Ear: Hearing, tympanic membrane, external ear and ear canal normal.  Nose: Nose normal.  Mouth/Throat: Uvula is midline, oropharynx is clear and moist and mucous membranes are normal.  Eyes: Conjunctivae are normal. Right eye exhibits no discharge. Left eye exhibits no discharge. No scleral icterus.  Neck: Normal range of motion. Neck supple.  Cardiovascular: Normal rate, regular rhythm and normal heart sounds.   Pulmonary/Chest: Effort normal and breath sounds normal. No respiratory distress. He has  no wheezes. He exhibits no tenderness.  Abdominal:  There is no tenderness.  Musculoskeletal: Normal range of motion.  Lymphadenopathy:    He has no cervical adenopathy.  Neurological: He is alert and oriented to person, place, and time.  Skin: Skin is warm and dry.  Psychiatric: He has a normal mood and affect. His behavior is normal.    ED Course  Procedures (including critical care time) Labs Review Labs Reviewed - No data to display Imaging Review No results found.  EKG Interpretation    Date/Time:    Ventricular Rate:    PR Interval:    QRS Duration:   QT Interval:    QTC Calculation:   R Axis:     Text Interpretation:              MDM  Mild URI. Symptomatic care and tessalon at home. PCP follow up if no improvement.     [place], [place] 03/14/13 1016

## 2013-03-14 NOTE — Discharge Instructions (Signed)

## 2013-03-14 NOTE — ED Notes (Signed)
C/o non productive cough that has been going on for 4 weeks. Pain is 7/10. Stated that the cough has caused chest and stomach pain. Has has some nausea, vomiting, and diarrhea and cold chills. Stated that he has been here before and received medications, thought the symptoms would go away, but have not. Written by: Lenore Manner, SMA

## 2013-03-15 NOTE — ED Provider Notes (Signed)
Medical screening examination/treatment/procedure(s) were performed by a resident physician or non-physician practitioner and as the supervising physician I was immediately available for consultation/collaboration.  Lynne Leader, MD    Gregor Hams, MD 03/15/13 [phone]

## 2013-03-19 ENCOUNTER — Other Ambulatory Visit (INDEPENDENT_AMBULATORY_CARE_PROVIDER_SITE_OTHER): Payer: 59

## 2013-03-19 ENCOUNTER — Encounter: Payer: Self-pay | Admitting: Internal Medicine

## 2013-03-19 ENCOUNTER — Ambulatory Visit (INDEPENDENT_AMBULATORY_CARE_PROVIDER_SITE_OTHER): Payer: 59 | Admitting: Internal Medicine

## 2013-03-19 VITALS — BP 126/90 | Temp 98.4°F | Ht 71.0 in | Wt 218.0 lb

## 2013-03-19 DIAGNOSIS — J019 Acute sinusitis, unspecified: Secondary | ICD-10-CM

## 2013-03-19 DIAGNOSIS — Z0000 Encounter for general adult medical examination without abnormal findings: Secondary | ICD-10-CM

## 2013-03-19 LAB — CBC WITH DIFFERENTIAL/PLATELET
BASOS ABS: 0 10*3/uL (ref 0.0–0.1)
Basophils Relative: 0.4 % (ref 0.0–3.0)
Eosinophils Absolute: 0.1 10*3/uL (ref 0.0–0.7)
Eosinophils Relative: 2 % (ref 0.0–5.0)
HCT: 44.2 % (ref 39.0–52.0)
HEMOGLOBIN: 15.1 g/dL (ref 13.0–17.0)
LYMPHS PCT: 36.3 % (ref 12.0–46.0)
Lymphs Abs: 1.6 10*3/uL (ref 0.7–4.0)
MCHC: 34.3 g/dL (ref 30.0–36.0)
MCV: 93.8 fl (ref 78.0–100.0)
MONOS PCT: 16.8 % — AB (ref 3.0–12.0)
Monocytes Absolute: 0.7 10*3/uL (ref 0.1–1.0)
Neutro Abs: 1.9 10*3/uL (ref 1.4–7.7)
Neutrophils Relative %: 44.5 % (ref 43.0–77.0)
PLATELETS: 232 10*3/uL (ref 150.0–400.0)
RBC: 4.71 Mil/uL (ref 4.22–5.81)
RDW: 12.7 % (ref 11.5–14.6)
WBC: 4.4 10*3/uL — ABNORMAL LOW (ref 4.5–10.5)

## 2013-03-19 LAB — POCT URINALYSIS DIPSTICK
BILIRUBIN UA: NEGATIVE
Blood, UA: NEGATIVE
Glucose, UA: NEGATIVE
KETONES UA: NEGATIVE
Leukocytes, UA: NEGATIVE
Nitrite, UA: NEGATIVE
Protein, UA: NEGATIVE
Spec Grav, UA: 1.025
Urobilinogen, UA: 2
pH, UA: 6.5

## 2013-03-19 LAB — BASIC METABOLIC PANEL
BUN: 15 mg/dL (ref 6–23)
CALCIUM: 9 mg/dL (ref 8.4–10.5)
CO2: 32 mEq/L (ref 19–32)
Chloride: 101 mEq/L (ref 96–112)
Creatinine, Ser: 0.9 mg/dL (ref 0.4–1.5)
GFR: 113.03 mL/min (ref >60.00)
Glucose, Bld: 94 mg/dL (ref 70–99)
Potassium: 3.7 mEq/L (ref 3.5–5.1)
SODIUM: 142 meq/L (ref 135–145)

## 2013-03-19 LAB — TSH: TSH: 0.72 u[IU]/mL (ref 0.35–5.50)

## 2013-03-19 LAB — HEPATIC FUNCTION PANEL
ALK PHOS: 75 U/L (ref 39–117)
ALT: 46 U/L (ref 0–53)
AST: 34 U/L (ref 0–37)
Albumin: 3.8 g/dL (ref 3.5–5.2)
Bilirubin, Direct: 0.1 mg/dL (ref 0.0–0.3)
Total Bilirubin: 0.8 mg/dL (ref 0.3–1.2)
Total Protein: 6.5 g/dL (ref 6.0–8.3)

## 2013-03-19 LAB — LIPID PANEL
Cholesterol: 128 mg/dL (ref 0–200)
HDL: 36.1 mg/dL — AB (ref >39.00)
LDL CALC: 57 mg/dL (ref 0–99)
TRIGLYCERIDES: 174 mg/dL — AB (ref 0.0–149.0)
Total CHOL/HDL Ratio: 4
VLDL: 34.8 mg/dL (ref 0.0–40.0)

## 2013-03-19 LAB — PSA: PSA: 0 ng/mL — ABNORMAL LOW (ref 0.10–4.00)

## 2013-03-19 MED ORDER — DOXYCYCLINE HYCLATE 100 MG PO TABS
100.0000 mg | ORAL_TABLET | Freq: Two times a day (BID) | ORAL | Status: AC
Start: 2013-03-19 — End: 2013-03-29

## 2013-03-19 NOTE — Progress Notes (Signed)
3 weeks of sinus congestion Past 3 days has had bloody sinus drainage  Has allergic rhinitis - uses flonase regularly  No fever known  Reviewed meds, and pmh  Ros- no fever BP 126/90  Temp(Src) 98.4 F (36.9 C) (Oral)  Ht 5\' 11"  (1.803 m)  Wt 218 lb (98.884 kg)  BMI 30.42 kg/m2 nad Chest cta Nasal mucosa with marked erythema  Sinusitis doxycycline

## 2013-03-23 ENCOUNTER — Other Ambulatory Visit: Payer: Self-pay | Admitting: Internal Medicine

## 2013-03-26 ENCOUNTER — Encounter: Payer: 59 | Admitting: Internal Medicine

## 2013-04-08 ENCOUNTER — Encounter: Payer: 59 | Admitting: Internal Medicine

## 2013-04-18 ENCOUNTER — Telehealth: Payer: Self-pay | Admitting: Internal Medicine

## 2013-04-18 ENCOUNTER — Encounter: Payer: Self-pay | Admitting: Internal Medicine

## 2013-04-18 NOTE — Telephone Encounter (Signed)
Noted  

## 2013-04-26 ENCOUNTER — Ambulatory Visit (INDEPENDENT_AMBULATORY_CARE_PROVIDER_SITE_OTHER): Payer: 59 | Admitting: Family Medicine

## 2013-04-26 ENCOUNTER — Encounter: Payer: Self-pay | Admitting: Family Medicine

## 2013-04-26 VITALS — BP 150/90 | HR 95 | Temp 98.9°F | Ht 71.0 in | Wt 215.0 lb

## 2013-04-26 DIAGNOSIS — J209 Acute bronchitis, unspecified: Secondary | ICD-10-CM

## 2013-04-26 MED ORDER — HYDROCODONE-HOMATROPINE 5-1.5 MG/5ML PO SYRP: 5.0000 mL | ORAL_SOLUTION | ORAL | Status: DC | PRN

## 2013-04-26 MED ORDER — AMOXICILLIN-POT CLAVULANATE 875-125 MG PO TABS: 1.0000 | ORAL_TABLET | Freq: Two times a day (BID) | ORAL | Status: DC

## 2013-04-26 NOTE — Progress Notes (Signed)
Pre visit review using our clinic review tool, if applicable. No additional management support is needed unless otherwise documented below in the visit note. 

## 2013-04-26 NOTE — Progress Notes (Signed)
   Subjective:    Patient ID: Mark [name], male    DOB: [birth year], 63 y.o.   MRN: [id number]  HPI Here for continued sx of sinus pressure, PND, chest tightness and coughing up yellow sputum. No fever. He finished a week of Doxycycline and benzonatate with no improvement.    Review of Systems  Constitutional: Negative.   HENT: Positive for congestion, postnasal drip and sinus pressure.   Eyes: Negative.   Respiratory: Positive for cough and chest tightness.        Objective:   Physical Exam  Constitutional: He appears well-developed and well-nourished.  HENT:  Right Ear: External ear normal.  Left Ear: External ear normal.  Nose: Nose normal.  Mouth/Throat: Oropharynx is clear and moist.  Eyes: Conjunctivae are normal.  Pulmonary/Chest: Effort normal and breath sounds normal. No respiratory distress. He has no wheezes. He has no rales.  Lymphadenopathy:    He has no cervical adenopathy.          Assessment & Plan:  Try Augmentin. Written out of work today and tomorrow

## 2013-05-09 ENCOUNTER — Telehealth: Payer: Self-pay | Admitting: Internal Medicine

## 2013-05-09 NOTE — Telephone Encounter (Signed)
Pt has a stomach virus, refused triage nurse, has diarrhea,  Feels like vomiting but has not, would like to see dr fry on Friday.

## 2013-05-10 MED ORDER — HYDROCODONE-HOMATROPINE 5-1.5 MG/5ML PO SYRP: 5.0000 mL | ORAL_SOLUTION | ORAL | Status: DC | PRN

## 2013-05-10 NOTE — Telephone Encounter (Signed)
Call in another 240 ml bottle with same directions

## 2013-05-10 NOTE — Telephone Encounter (Signed)
Okay to schedule

## 2013-05-10 NOTE — Telephone Encounter (Signed)
Pt states he is feeling better, stomach is better and no need for appt.  But he would would like a refill on HYDROcodone-homatropine (HYDROMET) 5-1.5 MG/5ML syrup Pt still has ongoing cough, and keeping him up at night.

## 2013-05-10 NOTE — Telephone Encounter (Signed)
Script was printed and I spoke with pt, he will need to pick this up.

## 2013-05-23 ENCOUNTER — Other Ambulatory Visit: Payer: Self-pay | Admitting: Internal Medicine

## 2013-06-17 ENCOUNTER — Ambulatory Visit (INDEPENDENT_AMBULATORY_CARE_PROVIDER_SITE_OTHER): Payer: 59 | Admitting: Internal Medicine

## 2013-06-17 ENCOUNTER — Encounter: Payer: Self-pay | Admitting: Internal Medicine

## 2013-06-17 VITALS — BP 134/84 | HR 76 | Temp 99.0°F | Ht 70.0 in | Wt 216.0 lb

## 2013-06-17 DIAGNOSIS — Z0000 Encounter for general adult medical examination without abnormal findings: Secondary | ICD-10-CM

## 2013-06-17 NOTE — Progress Notes (Signed)
cpx  Past Medical History  Diagnosis Date  . HYPERTENSION 03/07/2007  . Headache(784.0) 03/07/2007  . PROSTATE CANCER, HX OF 03/07/2007  . ASTHMA 03/12/2007  . HYPERLIPIDEMIA 06/11/2007  . GERD 04/02/2009  . HIATAL HERNIA 04/28/2009  . TRANSAMINASES, SERUM, ELEVATED 04/28/2009  . Gastroparesis 05/01/2009  . Anxiety state, unspecified 09/01/2009  . Hiatal hernia   . Ulcer     as teenager  . Tubular adenoma of colon   . Polyp of colon, hyperplastic     History   Social History  . Marital Status: Married    Spouse Name: N/A    Number of Children: 4  . Years of Education: N/A   Occupational History  .  [employer]   Social History Main Topics  . Smoking status: Former Smoker -- 0.50 packs/day for 2 years    Types: Cigarettes    Quit date: 04/09/1983  . Smokeless tobacco: Never Used  . Alcohol Use: No  . Drug Use: No  . Sexual Activity: Yes   Other Topics Concern  . Not on file   Social History Narrative  . No narrative on file    Past Surgical History  Procedure Laterality Date  . Prostatectomy  07/10/06  . Appendectomy    . Rotator cuff repair      9/09 on right  . Back surgery  05/2011  . Colonoscopy      Family History  Problem Relation Age of Onset  . Pancreatic cancer Mother 30  . Hypertension Father   . Hypertension Brother     x 2  . Colon cancer Neg Hx   . Rectal cancer Neg Hx   . Stomach cancer Neg Hx     Allergies  Allergen Reactions  . Propoxyphene Hcl Nausea And Vomiting         Current Outpatient Prescriptions on File Prior to Visit  Medication Sig Dispense Refill  . AMBULATORY NON FORMULARY MEDICATION Medication Name: Domperidone 10 mg Take 1 tablet by mouth three times daily  270 tablet  3  . azelastine (ASTELIN) 137 MCG/SPRAY nasal spray USE 2 PUFFS EACH NOSTRIL DAILY  30 mL  5  . butalbital-acetaminophen-caffeine (FIORICET, ESGIC) 50-325-40 MG per tablet TAKE 1 TABLET BY MOUTH EVERY DAY AS NEEDED  30 tablet  2  . diclofenac (VOLTAREN) 75 MG EC  tablet Take 75 mg by mouth 2 (two) times daily.      . fexofenadine (ALLEGRA) 180 MG tablet Take 180 mg by mouth daily.      . fluticasone (FLONASE) 50 MCG/ACT nasal spray USE ONE SPRAY AS DIRECTED ONCE DAILY  48 g  3  . HYDROcodone-homatropine (HYDROMET) 5-1.5 MG/5ML syrup Take 5 mLs by mouth every 4 (four) hours as needed for cough.  240 mL  0  . methocarbamol (ROBAXIN) 500 MG tablet Take 500 mg by mouth daily.      [name] Kitchen omeprazole (PRILOSEC) 40 MG capsule TAKE ONE CAPSULE TWICE A DAY  180 capsule  3  . ondansetron (ZOFRAN) 4 MG tablet TAKE 1 TABLET EVERY 8 HOURS AS NEEDED  90 tablet  3  . PARoxetine (PAXIL) 20 MG tablet TAKE 1 TABLET (20 MG TOTAL) BY MOUTH DAILY.  90 tablet  1  . PRESCRIPTION MEDICATION Inject 1 mL as directed See admin instructions. Trimix -PGE 20,PAPAVERINE 30,PHENTOLAMINE 0.5-AS NEEDED FOR ERECTILE DYSFUNCTION      . PROAIR HFA 108 (90 BASE) MCG/ACT inhaler INHALE 2 PUFFS 4 TIMES A DAY AS DIRECTED  8.5 each  0  . promethazine (PHENERGAN) 25 MG tablet TAKE 1 TABLET (25 MG TOTAL) BY MOUTH EVERY 6 (SIX) HOURS AS NEEDED FOR NAUSEA  90 tablet  3  . propranolol-hydrochlorothiazide (INDERIDE) 40-25 MG per tablet TAKE 1 TABLET BY MOUTH TWICE A DAY  180 tablet  0  . simvastatin (ZOCOR) 40 MG tablet TAKE ONE TABLET AT BEDTIME  90 tablet  3   No current facility-administered medications on file prior to visit.     patient denies chest pain, shortness of breath, orthopnea. Denies lower extremity edema, abdominal pain, change in appetite, change in bowel movements. Patient denies rashes, musculoskeletal complaints. No other specific complaints in a complete review of systems.   BP 134/84  Pulse 76  Temp(Src) 99 F (37.2 C) (Oral)  Ht 5\' 10"  (1.778 m)  Wt 216 lb (97.977 kg)  BMI 30.99 kg/m2  well-developed well-nourished male in no acute distress. HEENT exam atraumatic, normocephalic, neck supple without jugular venous distention. Chest clear to auscultation cardiac exam S1-S2 are  regular. Abdominal exam overweight with bowel sounds, soft and nontender. Extremities no edema. Neurologic exam is alert with a normal gait.  Well visit- health maint utd Weight loss discussed

## 2013-06-27 ENCOUNTER — Other Ambulatory Visit: Payer: Self-pay | Admitting: Internal Medicine

## 2013-07-26 ENCOUNTER — Telehealth: Payer: Self-pay | Admitting: *Deleted

## 2013-07-26 NOTE — Telephone Encounter (Signed)
-   Ok to proceed with surgery

## 2013-07-26 NOTE — Telephone Encounter (Signed)
Pt called stating that he had appt with Dr Leanne Chang last month for cpx.  He mentioned to Dr Leanne Chang that Dr Rolena Infante was doing back surgery on him and he needs the clearance sent to his office.  I didn't see anything documented in chart.  Could you confirm clearance and I will fax to Dr Rolena Infante office.

## 2013-07-29 ENCOUNTER — Encounter: Payer: Self-pay | Admitting: *Deleted

## 2013-07-29 NOTE — Telephone Encounter (Signed)
Clearance letter faxed to Dr Rolena Infante office, pt aware

## 2013-08-16 ENCOUNTER — Other Ambulatory Visit: Payer: Self-pay | Admitting: Internal Medicine

## 2013-10-01 ENCOUNTER — Encounter (HOSPITAL_COMMUNITY): Payer: Self-pay | Admitting: Pharmacy Technician

## 2013-10-02 NOTE — Pre-Procedure Instructions (Signed)
Mark [name]  10/02/2013   Your procedure is scheduled on:  Wednesday October 09, 2013 at 11:30 AM.  Report to [facility] Admitting at 9:30 AM.  Call this number if you have problems the morning of surgery: [phone]   Remember:   Do not eat food or drink liquids after midnight.   Take these medicines the morning of surgery with A SIP OF WATER: Albuterol inhaler if needed, Astelin nasal spray, Fexofenadine (Allegra), Omeprazole (Prilosec), Ondansetron (Zofran) or Promethazine (Phenergan) if needed, Propranolol (Inderide)   Do not wear jewelry.  Do not wear lotions, powders, or cologne.   Men may shave face and neck.  Do not bring valuables to the hospital.  [facility] is not responsible for any belongings or valuables.               Contacts, dentures or bridgework may not be worn into surgery.  Leave suitcase in the car. After surgery it may be brought to your room.  For patients admitted to the hospital, discharge time is determined by your treatment team.               Patients discharged the day of surgery will not be allowed to drive home.  Name and phone number of your driver: Family/Friend  Special Instructions: Shower using CHG soap the night before and the morning of your surgery   Please read over the following fact sheets that you were given: Pain Booklet, Coughing and Deep Breathing, Blood Transfusion Information, MRSA Information and Surgical Site Infection Prevention

## 2013-10-03 ENCOUNTER — Other Ambulatory Visit (HOSPITAL_COMMUNITY): Payer: 59

## 2013-10-03 ENCOUNTER — Encounter (HOSPITAL_COMMUNITY)
Admission: RE | Admit: 2013-10-03 | Discharge: 2013-10-03 | Disposition: A | Discharge status: Home / Self Care | Payer: 59 | Source: Ambulatory Visit | Attending: Orthopedic Surgery | Admitting: Orthopedic Surgery

## 2013-10-03 ENCOUNTER — Ambulatory Visit (HOSPITAL_COMMUNITY)
Admission: RE | Admit: 2013-10-03 | Discharge: 2013-10-03 | Disposition: A | Discharge status: Home / Self Care | Payer: 59 | Source: Ambulatory Visit | Attending: Orthopedic Surgery | Admitting: Orthopedic Surgery

## 2013-10-03 ENCOUNTER — Encounter (HOSPITAL_COMMUNITY): Payer: Self-pay

## 2013-10-03 DIAGNOSIS — Z01818 Encounter for other preprocedural examination: Secondary | ICD-10-CM | POA: Diagnosis present

## 2013-10-03 DIAGNOSIS — I10 Essential (primary) hypertension: Secondary | ICD-10-CM | POA: Diagnosis not present

## 2013-10-03 DIAGNOSIS — M5137 Other intervertebral disc degeneration, lumbosacral region: Secondary | ICD-10-CM | POA: Insufficient documentation

## 2013-10-03 DIAGNOSIS — M51379 Other intervertebral disc degeneration, lumbosacral region without mention of lumbar back pain or lower extremity pain: Secondary | ICD-10-CM | POA: Insufficient documentation

## 2013-10-03 HISTORY — DX: Malignant (primary) neoplasm, unspecified: C80.1

## 2013-10-03 HISTORY — DX: Pneumonia, unspecified organism: J18.9

## 2013-10-03 LAB — TYPE AND SCREEN
ABO/RH(D): O POS
Antibody Screen: NEGATIVE

## 2013-10-03 LAB — CBC
HCT: 43.5 % (ref 39.0–52.0)
Hemoglobin: 15.3 g/dL (ref 13.0–17.0)
MCH: 32.6 pg (ref 26.0–34.0)
MCHC: 35.2 g/dL (ref 30.0–36.0)
MCV: 92.6 fL (ref 78.0–100.0)
Platelets: 253 10*3/uL (ref 150–400)
RBC: 4.7 MIL/uL (ref 4.22–5.81)
RDW: 12.4 % (ref 11.5–15.5)
WBC: 5.8 10*3/uL (ref 4.0–10.5)

## 2013-10-03 LAB — ABO/RH: ABO/RH(D): O POS

## 2013-10-03 LAB — BASIC METABOLIC PANEL
Anion gap: 14 (ref 5–15)
BUN: 12 mg/dL (ref 6–23)
CO2: 26 mEq/L (ref 19–32)
Calcium: 9.2 mg/dL (ref 8.4–10.5)
Chloride: 102 mEq/L (ref 96–112)
Creatinine, Ser: 0.76 mg/dL (ref 0.50–1.35)
GFR calc Af Amer: >90 mL/min (ref >90)
GFR calc non Af Amer: >90 mL/min (ref >90)
Glucose, Bld: 82 mg/dL (ref 70–99)
Potassium: 3.7 mEq/L (ref 3.7–5.3)
Sodium: 142 mEq/L (ref 137–147)

## 2013-10-03 LAB — SURGICAL PCR SCREEN
MRSA, PCR: NEGATIVE
STAPHYLOCOCCUS AUREUS: NEGATIVE

## 2013-10-03 LAB — PROTIME-INR
INR: 0.95 (ref 0.00–1.49)
Prothrombin Time: 12.7 seconds (ref 11.6–15.2)

## 2013-10-03 LAB — APTT: aPTT: 33 seconds (ref 24–37)

## 2013-10-03 NOTE — Progress Notes (Signed)
10/03/13 1329  OBSTRUCTIVE SLEEP APNEA  Have you ever been diagnosed with sleep apnea through a sleep study? No  Do you snore loudly (loud enough to be heard through closed doors)?  0  Do you often feel tired, fatigued, or sleepy during the daytime? 0  Has anyone observed you stop breathing during your sleep? 0  Do you have, or are you being treated for high blood pressure? 1  BMI more than 35 kg/m2? 0  Age over 62 years old? 1  Neck circumference greater than 40 cm/16 inches? 1  Gender: 1  Obstructive Sleep Apnea Score 4  Score 4 or greater  Results sent to PCP   This patient has screened at risk for sleep apnea using the STOP bang tool during a pre-surgical visit. A score of 4 or greater is at risk for sleep apnea.

## 2013-10-03 NOTE — H&P (Signed)
Mark Mark [name] is an 63 y.o. male.   History of Present Illness  The patient is a 63 year old male who comes in today for a preoperative history and physical. The patient is scheduled for a L4-5 Decompression Foraminotomy Revision TLIF L5-S1 to be performed by Dr. Duane Lope D. Rolena Infante, MD at [facility] on 08.12.2015.  Symptoms unchanged.     Additional reasons for visit:  Follow-up back is described as the following: The patient is being followed for their back pain. Symptoms reported today include: pain and aching, while the patient does not report symptoms of: leg pain. The patient states that they are doing poorly. Current treatment includes: activity modification and NSAIDs. The following medication has been used for pain control: Gabapentin, and Robaxin. The patient reports their current pain level to be 6 / 10. AllergiesDarvon *ANALGESICS - OPIOID*  Family History  Cancer mother and brother Hypertension First Degree Relatives. father and sister  Social History Tobacco use Never smoker. never smoker Alcohol use never consumed alcohol Children 4 Current work status working full time Drug/Alcohol Rehab (Currently) no Drug/Alcohol Rehab (Previously) no Exercise Exercises monthly; does other Illicit drug use no Living situation live with spouse Marital status married Number of flights of stairs before winded 2-3 Pain Contract no  Medication History  Gabapentin (300MG  Capsule, 3 (three) Oral PO Q HS, Taken starting 07/23/2013) Active. Diclofenac Sodium (75MG  Tablet DR, 1 (one) Tablet DR Oral PO Q DAY, Taken starting 03/07/2013) Active. Simvastatin (40MG  Tablet, Oral) Active. (qd) Azelastine HCl (137MCG/SPRAY Solution, Nasal) Active. (qd) NexIUM (40MG  Capsule DR, Oral) Active. Detrol LA (4MG  Capsule ER 24HR, Oral) Active. Promethazine HCl (25MG  Tablet, Oral as needed) Active. (prn) ProAir HFA (108 (90 Base)MCG/ACT Aerosol Soln, Inhalation) Active.  (prn) Propranolol-HCTZ (40-25MG  Tablet, Oral) Active. (qd) PARoxetine HCl (20MG  Tablet, Oral) Active. (qd) Ondansetron HCl (4MG  Tablet, Oral) Active. (prn) Fluticasone Propionate (50MCG/ACT Suspension, Nasal) Active. (qd) Butalbital-APAP-Caffeine (50-325-40MG  Tablet, Oral) Active. (prn)    Vitals  10/03/2013 8:21 AM Weight: 208 lb Height: 71in Body Surface Area: 2.17 Mark [name] Body Mass Index: 29.01 kg/Mark [name] Temp.: 97.48F  Pulse: 63 (Regular)  BP: 148/83 (Sitting, Left Arm, Standard)   Review of Systems  Constitutional: Negative.   HENT: Negative.   Eyes: Negative.   Respiratory: Negative.   Cardiovascular: Negative.   Gastrointestinal: Positive for nausea.  Genitourinary: Negative.   Musculoskeletal: Positive for back pain.       Right leg weakness.   Neurological: Positive for tingling.  Psychiatric/Behavioral: Negative.     There were no vitals taken for this visit. Physical Exam  Constitutional: He is oriented to person, place, and time. He appears well-developed and well-nourished.  HENT:  Head: Normocephalic.  Eyes: EOM are normal. Pupils are equal, round, and reactive to light.  Neck: Normal range of motion.  Cardiovascular: Normal rate and regular rhythm.   Respiratory: Effort normal and breath sounds normal.  GI: Soft.  Musculoskeletal: Normal range of motion. He exhibits tenderness (lumbar paraspinal).  Neurological: He is alert and oriented to person, place, and time.  Skin: Skin is warm and dry.     Assessment/Plan Lumbar stenosis.  Low back pain and bilat LE radiculopathy.  Will proceed with surgery as scheduled.  Surgical procedure along with possible risks, complications, recovery time discussed.  All questions answered.    Mark Mark [name] 10/03/2013, 8:49 AM

## 2013-10-03 NOTE — Progress Notes (Signed)
Patient informed Nurse that he had a stress test > 5 years ago, but denied having any chest pain or discomfort at this time. Patient has asthma and last used Albuterol inhaler yesterday. Patient denied having any current shortness of breath. PCP is Dr. Leanne Chang; encounters in [facility]

## 2013-10-08 MED ORDER — DEXAMETHASONE SODIUM PHOSPHATE 4 MG/ML IJ SOLN
4.0000 mg | Freq: Once | INTRAMUSCULAR | Status: DC
  Filled 2013-10-08: qty 1

## 2013-10-08 MED ORDER — DEXTROSE 5 % IV SOLN
2.0000 g | INTRAVENOUS | Status: AC
  Administered 2013-10-09: 2 g via INTRAVENOUS
  Administered 2013-10-09: 1 g via INTRAVENOUS
  Filled 2013-10-08: qty 2

## 2013-10-09 ENCOUNTER — Encounter (HOSPITAL_COMMUNITY): Payer: Self-pay | Admitting: Certified Registered Nurse Anesthetist

## 2013-10-09 ENCOUNTER — Encounter (HOSPITAL_COMMUNITY): Payer: 59 | Admitting: Certified Registered Nurse Anesthetist

## 2013-10-09 ENCOUNTER — Encounter (HOSPITAL_COMMUNITY)
Admission: RE | Disposition: A | Discharge status: Home Health | Payer: Self-pay | Source: Ambulatory Visit | Attending: Orthopedic Surgery

## 2013-10-09 ENCOUNTER — Inpatient Hospital Stay (HOSPITAL_COMMUNITY): Payer: 59

## 2013-10-09 ENCOUNTER — Inpatient Hospital Stay (HOSPITAL_COMMUNITY)
Admission: RE | Admit: 2013-10-09 | Discharge: 2013-10-12 | DRG: 460 | Disposition: A | Discharge status: Home Health | Payer: 59 | Source: Ambulatory Visit | Attending: Orthopedic Surgery | Admitting: Orthopedic Surgery

## 2013-10-09 ENCOUNTER — Inpatient Hospital Stay (HOSPITAL_COMMUNITY): Payer: 59 | Admitting: Certified Registered Nurse Anesthetist

## 2013-10-09 DIAGNOSIS — F411 Generalized anxiety disorder: Secondary | ICD-10-CM | POA: Diagnosis present

## 2013-10-09 DIAGNOSIS — M549 Dorsalgia, unspecified: Secondary | ICD-10-CM | POA: Diagnosis present

## 2013-10-09 DIAGNOSIS — E785 Hyperlipidemia, unspecified: Secondary | ICD-10-CM | POA: Diagnosis present

## 2013-10-09 DIAGNOSIS — I10 Essential (primary) hypertension: Secondary | ICD-10-CM | POA: Diagnosis present

## 2013-10-09 DIAGNOSIS — M51379 Other intervertebral disc degeneration, lumbosacral region without mention of lumbar back pain or lower extremity pain: Principal | ICD-10-CM | POA: Diagnosis present

## 2013-10-09 DIAGNOSIS — Z8249 Family history of ischemic heart disease and other diseases of the circulatory system: Secondary | ICD-10-CM

## 2013-10-09 DIAGNOSIS — K219 Gastro-esophageal reflux disease without esophagitis: Secondary | ICD-10-CM | POA: Diagnosis present

## 2013-10-09 DIAGNOSIS — K449 Diaphragmatic hernia without obstruction or gangrene: Secondary | ICD-10-CM | POA: Diagnosis present

## 2013-10-09 DIAGNOSIS — J45909 Unspecified asthma, uncomplicated: Secondary | ICD-10-CM | POA: Diagnosis present

## 2013-10-09 DIAGNOSIS — M5137 Other intervertebral disc degeneration, lumbosacral region: Principal | ICD-10-CM | POA: Diagnosis present

## 2013-10-09 HISTORY — PX: TRANSFORAMINAL LUMBAR INTERBODY FUSION (TLIF) WITH PEDICLE SCREW FIXATION 1 LEVEL: SHX6141

## 2013-10-09 HISTORY — PX: LUMBAR LAMINECTOMY/DECOMPRESSION MICRODISCECTOMY: SHX5026

## 2013-10-09 LAB — GLUCOSE, CAPILLARY: GLUCOSE-CAPILLARY: 119 mg/dL — AB (ref 70–99)

## 2013-10-09 SURGERY — LUMBAR LAMINECTOMY/DECOMPRESSION MICRODISCECTOMY 1 LEVEL
Anesthesia: General | Site: Spine Lumbar

## 2013-10-09 MED ORDER — PROPRANOLOL HCL 40 MG PO TABS
40.0000 mg | ORAL_TABLET | Freq: Two times a day (BID) | ORAL | Status: DC
  Administered 2013-10-09 – 2013-10-12 (×6): 40 mg via ORAL
  Filled 2013-10-09 (×7): qty 1

## 2013-10-09 MED ORDER — HYDROMORPHONE HCL PF 1 MG/ML IJ SOLN
INTRAMUSCULAR | Status: AC
  Filled 2013-10-09: qty 1

## 2013-10-09 MED ORDER — SODIUM CHLORIDE 0.9 % IJ SOLN: 9.0000 mL | INTRAMUSCULAR | Status: DC | PRN

## 2013-10-09 MED ORDER — SODIUM CHLORIDE 0.9 % IJ SOLN: 3.0000 mL | INTRAMUSCULAR | Status: DC | PRN

## 2013-10-09 MED ORDER — MORPHINE SULFATE (PF) 1 MG/ML IV SOLN
INTRAVENOUS | Status: AC
  Filled 2013-10-09: qty 25

## 2013-10-09 MED ORDER — THROMBIN 20000 UNITS EX SOLR
CUTANEOUS | Status: DC | PRN
  Administered 2013-10-09: 13:00:00 via TOPICAL

## 2013-10-09 MED ORDER — EPHEDRINE SULFATE 50 MG/ML IJ SOLN
INTRAMUSCULAR | Status: DC | PRN
  Administered 2013-10-09 (×4): 10 mg via INTRAVENOUS

## 2013-10-09 MED ORDER — GABAPENTIN 300 MG PO CAPS
900.0000 mg | ORAL_CAPSULE | Freq: Every day | ORAL | Status: DC
Start: 2013-10-09 — End: 2013-10-12
  Administered 2013-10-09 – 2013-10-11 (×3): 900 mg via ORAL
  Filled 2013-10-09 (×4): qty 3

## 2013-10-09 MED ORDER — DIPHENHYDRAMINE HCL 50 MG/ML IJ SOLN
12.5000 mg | Freq: Four times a day (QID) | INTRAMUSCULAR | Status: DC | PRN
  Administered 2013-10-10: 12.5 mg via INTRAVENOUS
  Filled 2013-10-09: qty 1

## 2013-10-09 MED ORDER — FENTANYL CITRATE 0.05 MG/ML IJ SOLN
INTRAMUSCULAR | Status: AC
  Filled 2013-10-09: qty 5

## 2013-10-09 MED ORDER — ROCURONIUM BROMIDE 50 MG/5ML IV SOLN
INTRAVENOUS | Status: AC
  Filled 2013-10-09: qty 1

## 2013-10-09 MED ORDER — 0.9 % SODIUM CHLORIDE (POUR BTL) OPTIME
TOPICAL | Status: DC | PRN
  Administered 2013-10-09: 1000 mL

## 2013-10-09 MED ORDER — DEXAMETHASONE SODIUM PHOSPHATE 4 MG/ML IJ SOLN
4.0000 mg | Freq: Four times a day (QID) | INTRAMUSCULAR | Status: DC
  Administered 2013-10-10 – 2013-10-11 (×3): 4 mg via INTRAVENOUS
  Filled 2013-10-09 (×14): qty 1

## 2013-10-09 MED ORDER — LACTATED RINGERS IV SOLN
INTRAVENOUS | Status: DC | PRN
  Administered 2013-10-09 (×3): via INTRAVENOUS

## 2013-10-09 MED ORDER — BUPIVACAINE-EPINEPHRINE (PF) 0.25% -1:200000 IJ SOLN
INTRAMUSCULAR | Status: AC
  Filled 2013-10-09: qty 30

## 2013-10-09 MED ORDER — MAGNESIUM CITRATE PO SOLN: 1.0000 | Freq: Once | ORAL | Status: AC | PRN

## 2013-10-09 MED ORDER — SUCCINYLCHOLINE CHLORIDE 20 MG/ML IJ SOLN
INTRAMUSCULAR | Status: DC | PRN
  Administered 2013-10-09: 100 mg via INTRAVENOUS

## 2013-10-09 MED ORDER — DEXTROSE 5 % IV SOLN
1.0000 g | INTRAVENOUS | Status: AC
  Filled 2013-10-09: qty 10

## 2013-10-09 MED ORDER — ALBUTEROL SULFATE HFA 108 (90 BASE) MCG/ACT IN AERS: 2.0000 | INHALATION_SPRAY | Freq: Four times a day (QID) | RESPIRATORY_TRACT | Status: DC | PRN

## 2013-10-09 MED ORDER — PROPOFOL INFUSION 10 MG/ML OPTIME
INTRAVENOUS | Status: DC | PRN
  Administered 2013-10-09: 50 ug/kg/min via INTRAVENOUS

## 2013-10-09 MED ORDER — LACTATED RINGERS IV SOLN: INTRAVENOUS | Status: DC

## 2013-10-09 MED ORDER — FESOTERODINE FUMARATE ER 4 MG PO TB24
4.0000 mg | ORAL_TABLET | Freq: Every day | ORAL | Status: DC
Start: 2013-10-10 — End: 2013-10-12
  Administered 2013-10-10 – 2013-10-12 (×3): 4 mg via ORAL
  Filled 2013-10-09 (×3): qty 1

## 2013-10-09 MED ORDER — HYDROCHLOROTHIAZIDE 25 MG PO TABS
25.0000 mg | ORAL_TABLET | Freq: Two times a day (BID) | ORAL | Status: DC
  Administered 2013-10-09 – 2013-10-12 (×6): 25 mg via ORAL
  Filled 2013-10-09 (×7): qty 1

## 2013-10-09 MED ORDER — PROMETHAZINE HCL 25 MG/ML IJ SOLN
INTRAMUSCULAR | Status: AC
  Filled 2013-10-09: qty 1

## 2013-10-09 MED ORDER — DEXAMETHASONE SODIUM PHOSPHATE 4 MG/ML IJ SOLN
INTRAMUSCULAR | Status: DC | PRN
  Administered 2013-10-09: 4 mg via INTRAVENOUS

## 2013-10-09 MED ORDER — OXYCODONE HCL 5 MG PO TABS
10.0000 mg | ORAL_TABLET | ORAL | Status: DC | PRN
  Administered 2013-10-10 – 2013-10-12 (×10): 10 mg via ORAL
  Filled 2013-10-09 (×10): qty 2

## 2013-10-09 MED ORDER — HYDROMORPHONE HCL PF 1 MG/ML IJ SOLN
INTRAMUSCULAR | Status: AC
  Administered 2013-10-09: 0.5 mg via INTRAVENOUS
  Filled 2013-10-09: qty 1

## 2013-10-09 MED ORDER — OXYCODONE HCL 5 MG/5ML PO SOLN: 5.0000 mg | Freq: Once | ORAL | Status: DC | PRN

## 2013-10-09 MED ORDER — DEXAMETHASONE 4 MG PO TABS
4.0000 mg | ORAL_TABLET | Freq: Four times a day (QID) | ORAL | Status: DC
  Administered 2013-10-10 – 2013-10-12 (×8): 4 mg via ORAL
  Filled 2013-10-09 (×14): qty 1

## 2013-10-09 MED ORDER — ONDANSETRON HCL 4 MG/2ML IJ SOLN: 4.0000 mg | Freq: Four times a day (QID) | INTRAMUSCULAR | Status: DC | PRN

## 2013-10-09 MED ORDER — CEFAZOLIN SODIUM 1-5 GM-% IV SOLN
1.0000 g | Freq: Three times a day (TID) | INTRAVENOUS | Status: AC
  Administered 2013-10-10 (×2): 1 g via INTRAVENOUS
  Filled 2013-10-09 (×2): qty 50

## 2013-10-09 MED ORDER — PROMETHAZINE HCL 25 MG/ML IJ SOLN: 6.2500 mg | INTRAMUSCULAR | Status: DC | PRN

## 2013-10-09 MED ORDER — PROPOFOL 10 MG/ML IV BOLUS
INTRAVENOUS | Status: DC | PRN
  Administered 2013-10-09: 50 mg via INTRAVENOUS
  Administered 2013-10-09: 150 mg via INTRAVENOUS

## 2013-10-09 MED ORDER — ONDANSETRON HCL 4 MG/2ML IJ SOLN
INTRAMUSCULAR | Status: AC
  Filled 2013-10-09: qty 2

## 2013-10-09 MED ORDER — THROMBIN 20000 UNITS EX SOLR
CUTANEOUS | Status: AC
  Filled 2013-10-09: qty 20000

## 2013-10-09 MED ORDER — FENTANYL CITRATE 0.05 MG/ML IJ SOLN
INTRAMUSCULAR | Status: DC | PRN
  Administered 2013-10-09 (×2): 25 ug via INTRAVENOUS
  Administered 2013-10-09: 50 ug via INTRAVENOUS
  Administered 2013-10-09 (×3): 25 ug via INTRAVENOUS
  Administered 2013-10-09: 50 ug via INTRAVENOUS
  Administered 2013-10-09 (×3): 25 ug via INTRAVENOUS

## 2013-10-09 MED ORDER — AZELASTINE HCL 0.1 % NA SOLN
2.0000 | Freq: Every day | NASAL | Status: DC
  Administered 2013-10-10 – 2013-10-12 (×3): 2 via NASAL
  Filled 2013-10-09: qty 30

## 2013-10-09 MED ORDER — PROPOFOL 10 MG/ML IV BOLUS
INTRAVENOUS | Status: AC
  Filled 2013-10-09: qty 20

## 2013-10-09 MED ORDER — DIPHENHYDRAMINE HCL 12.5 MG/5ML PO ELIX: 12.5000 mg | ORAL_SOLUTION | Freq: Four times a day (QID) | ORAL | Status: DC | PRN

## 2013-10-09 MED ORDER — PROPRANOLOL-HCTZ 40-25 MG PO TABS: 1.0000 | ORAL_TABLET | Freq: Two times a day (BID) | ORAL | Status: DC

## 2013-10-09 MED ORDER — ACETAMINOPHEN 10 MG/ML IV SOLN
1000.0000 mg | Freq: Four times a day (QID) | INTRAVENOUS | Status: DC
  Administered 2013-10-09: 1000 mg via INTRAVENOUS
  Filled 2013-10-09: qty 100

## 2013-10-09 MED ORDER — ONDANSETRON HCL 4 MG/2ML IJ SOLN
INTRAMUSCULAR | Status: DC | PRN
  Administered 2013-10-09: 4 mg via INTRAVENOUS

## 2013-10-09 MED ORDER — SODIUM CHLORIDE 0.9 % IV SOLN: 250.0000 mL | INTRAVENOUS | Status: DC

## 2013-10-09 MED ORDER — DOCUSATE SODIUM 100 MG PO CAPS
100.0000 mg | ORAL_CAPSULE | Freq: Two times a day (BID) | ORAL | Status: DC
  Administered 2013-10-09 – 2013-10-12 (×6): 100 mg via ORAL
  Filled 2013-10-09 (×7): qty 1

## 2013-10-09 MED ORDER — LIDOCAINE HCL (CARDIAC) 20 MG/ML IV SOLN
INTRAVENOUS | Status: AC
  Filled 2013-10-09: qty 5

## 2013-10-09 MED ORDER — DEXTROSE 5 % IV SOLN
500.0000 mg | Freq: Four times a day (QID) | INTRAVENOUS | Status: DC | PRN
  Administered 2013-10-09: 500 mg via INTRAVENOUS
  Filled 2013-10-09 (×2): qty 5

## 2013-10-09 MED ORDER — FLUTICASONE PROPIONATE 50 MCG/ACT NA SUSP
1.0000 | Freq: Every day | NASAL | Status: DC | PRN
  Filled 2013-10-09: qty 16

## 2013-10-09 MED ORDER — PAROXETINE HCL 20 MG PO TABS
20.0000 mg | ORAL_TABLET | Freq: Every day | ORAL | Status: DC
  Administered 2013-10-09 – 2013-10-11 (×3): 20 mg via ORAL
  Filled 2013-10-09 (×4): qty 1

## 2013-10-09 MED ORDER — HEMOSTATIC AGENTS (NO CHARGE) OPTIME
TOPICAL | Status: DC | PRN
  Administered 2013-10-09: 1 via TOPICAL

## 2013-10-09 MED ORDER — ONDANSETRON HCL 4 MG/2ML IJ SOLN
4.0000 mg | INTRAMUSCULAR | Status: DC | PRN
  Administered 2013-10-10 – 2013-10-11 (×2): 4 mg via INTRAVENOUS
  Filled 2013-10-09 (×2): qty 2

## 2013-10-09 MED ORDER — OXYCODONE HCL 5 MG PO TABS: 5.0000 mg | ORAL_TABLET | Freq: Once | ORAL | Status: DC | PRN

## 2013-10-09 MED ORDER — MENTHOL 3 MG MT LOZG: 1.0000 | LOZENGE | OROMUCOSAL | Status: DC | PRN

## 2013-10-09 MED ORDER — HYDROMORPHONE HCL PF 1 MG/ML IJ SOLN
0.2500 mg | INTRAMUSCULAR | Status: DC | PRN
  Administered 2013-10-09 (×4): 0.5 mg via INTRAVENOUS

## 2013-10-09 MED ORDER — METHOCARBAMOL 500 MG PO TABS
500.0000 mg | ORAL_TABLET | Freq: Four times a day (QID) | ORAL | Status: DC | PRN
  Administered 2013-10-10: 500 mg via ORAL
  Filled 2013-10-09 (×2): qty 1

## 2013-10-09 MED ORDER — SODIUM CHLORIDE 0.9 % IJ SOLN
3.0000 mL | Freq: Two times a day (BID) | INTRAMUSCULAR | Status: DC
  Administered 2013-10-09 – 2013-10-11 (×2): 3 mL via INTRAVENOUS

## 2013-10-09 MED ORDER — LIDOCAINE HCL (CARDIAC) 20 MG/ML IV SOLN
INTRAVENOUS | Status: DC | PRN
  Administered 2013-10-09: 40 mg via INTRAVENOUS

## 2013-10-09 MED ORDER — MIDAZOLAM HCL 5 MG/5ML IJ SOLN
INTRAMUSCULAR | Status: DC | PRN
  Administered 2013-10-09: 2 mg via INTRAVENOUS

## 2013-10-09 MED ORDER — BUPIVACAINE-EPINEPHRINE 0.25% -1:200000 IJ SOLN
INTRAMUSCULAR | Status: DC | PRN
  Administered 2013-10-09: 10 mL

## 2013-10-09 MED ORDER — LACTATED RINGERS IV SOLN
INTRAVENOUS | Status: DC
  Administered 2013-10-09: 10:00:00 via INTRAVENOUS

## 2013-10-09 MED ORDER — ACETAMINOPHEN 10 MG/ML IV SOLN
1000.0000 mg | Freq: Once | INTRAVENOUS | Status: AC
  Administered 2013-10-09: 1000 mg via INTRAVENOUS
  Filled 2013-10-09 (×2): qty 100

## 2013-10-09 MED ORDER — PHENYLEPHRINE HCL 10 MG/ML IJ SOLN
INTRAMUSCULAR | Status: DC | PRN
  Administered 2013-10-09: 80 ug via INTRAVENOUS
  Administered 2013-10-09: 40 ug via INTRAVENOUS

## 2013-10-09 MED ORDER — DEXTROSE 5 % IV SOLN
10.0000 mg | INTRAVENOUS | Status: DC | PRN
  Administered 2013-10-09: 15 ug/min via INTRAVENOUS

## 2013-10-09 MED ORDER — MORPHINE SULFATE (PF) 1 MG/ML IV SOLN
INTRAVENOUS | Status: DC
  Administered 2013-10-09: 18:00:00 via INTRAVENOUS
  Administered 2013-10-09: 7 mg via INTRAVENOUS
  Administered 2013-10-10: 3 mg via INTRAVENOUS
  Administered 2013-10-10: 5 mg via INTRAVENOUS

## 2013-10-09 MED ORDER — PHENOL 1.4 % MT LIQD: 1.0000 | OROMUCOSAL | Status: DC | PRN

## 2013-10-09 MED ORDER — MIDAZOLAM HCL 2 MG/2ML IJ SOLN
INTRAMUSCULAR | Status: AC
  Filled 2013-10-09: qty 2

## 2013-10-09 MED ORDER — NALOXONE HCL 0.4 MG/ML IJ SOLN: 0.4000 mg | INTRAMUSCULAR | Status: DC | PRN

## 2013-10-09 SURGICAL SUPPLY — 87 items
BUR EGG ELITE 4.0 (BURR) IMPLANT
BUR EGG ELITE 4.0MM (BURR)
BUR MATCHSTICK NEURO 3.0 LAGG (BURR) IMPLANT
CAGE TLIF XL 8 LUMBAR (Cage) ×1 IMPLANT
CAGE TLIF XL 8MM LUMBAR (Cage) ×1 IMPLANT
CANISTER SUCTION 2500CC (MISCELLANEOUS) ×3 IMPLANT
CLIP NEUROVISION LG (CLIP) ×2 IMPLANT
CLOSURE STERI-STRIP 1/2X4 (GAUZE/BANDAGES/DRESSINGS) ×2
CLSR STERI-STRIP ANTIMIC 1/2X4 (GAUZE/BANDAGES/DRESSINGS) ×3 IMPLANT
CORDS BIPOLAR (ELECTRODE) ×3 IMPLANT
COVER SURGICAL LIGHT HANDLE (MISCELLANEOUS) ×3 IMPLANT
DRAIN CHANNEL 15F RND FF W/TCR (WOUND CARE) ×3 IMPLANT
DRAPE POUCH INSTRU U-SHP 10X18 (DRAPES) ×3 IMPLANT
DRAPE SURG 17X23 STRL (DRAPES) ×3 IMPLANT
DRAPE U-SHAPE 47X51 STRL (DRAPES) ×3 IMPLANT
DRSG MEPILEX BORDER 4X4 (GAUZE/BANDAGES/DRESSINGS) ×3 IMPLANT
DRSG MEPILEX BORDER 4X8 (GAUZE/BANDAGES/DRESSINGS) ×3 IMPLANT
DURAPREP 26ML APPLICATOR (WOUND CARE) ×3 IMPLANT
ELECT BLADE 4.0 EZ CLEAN MEGAD (MISCELLANEOUS) ×3
ELECT CAUTERY BLADE 6.4 (BLADE) ×3 IMPLANT
ELECT PENCIL ROCKER SW 15FT (MISCELLANEOUS) ×3 IMPLANT
ELECT REM PT RETURN 9FT ADLT (ELECTROSURGICAL) ×3
ELECTRODE BLDE 4.0 EZ CLN MEGD (MISCELLANEOUS) IMPLANT
ELECTRODE REM PT RTRN 9FT ADLT (ELECTROSURGICAL) ×1 IMPLANT
EVACUATOR SILICONE 100CC (DRAIN) ×3 IMPLANT
GLOVE BIOGEL PI IND STRL 8 (GLOVE) ×1 IMPLANT
GLOVE BIOGEL PI IND STRL 8.5 (GLOVE) ×1 IMPLANT
GLOVE BIOGEL PI INDICATOR 8 (GLOVE) ×2
GLOVE BIOGEL PI INDICATOR 8.5 (GLOVE) ×2
GLOVE ECLIPSE 8.5 STRL (GLOVE) ×3 IMPLANT
GLOVE ORTHO TXT STRL SZ7.5 (GLOVE) ×3 IMPLANT
GOWN STRL REUS W/ TWL LRG LVL3 (GOWN DISPOSABLE) ×1 IMPLANT
GOWN STRL REUS W/ TWL XL LVL3 (GOWN DISPOSABLE) ×2 IMPLANT
GOWN STRL REUS W/TWL 2XL LVL3 (GOWN DISPOSABLE) ×6 IMPLANT
GOWN STRL REUS W/TWL LRG LVL3 (GOWN DISPOSABLE) ×3
GOWN STRL REUS W/TWL XL LVL3 (GOWN DISPOSABLE) ×6
GUIDEWIRE NITINOL BEVEL TIP (WIRE) ×8 IMPLANT
KIT BASIN OR (CUSTOM PROCEDURE TRAY) ×3 IMPLANT
KIT NDL NVM5 EMG ELECT (KITS) IMPLANT
KIT NEEDLE NVM5 EMG ELECT (KITS) ×1 IMPLANT
KIT NEEDLE NVM5 EMG ELECTRODE (KITS) ×2
KIT ROOM TURNOVER OR (KITS) ×3 IMPLANT
LIGHT SOURCE ANGLE TIP STR 7FT (MISCELLANEOUS) ×2 IMPLANT
MAS TLIF HOOP SHIM (KITS) ×2 IMPLANT
NDL I-PASS III (NEEDLE) IMPLANT
NDL SPNL 18GX3.5 QUINCKE PK (NEEDLE) ×2 IMPLANT
NEEDLE 22X1 1/2 (OR ONLY) (NEEDLE) ×3 IMPLANT
NEEDLE I-PASS III (NEEDLE) ×3 IMPLANT
NEEDLE SPNL 18GX3.5 QUINCKE PK (NEEDLE) ×6 IMPLANT
NS IRRIG 1000ML POUR BTL (IV SOLUTION) ×3 IMPLANT
PACK LAMINECTOMY ORTHO (CUSTOM PROCEDURE TRAY) ×3 IMPLANT
PACK UNIVERSAL I (CUSTOM PROCEDURE TRAY) ×3 IMPLANT
PAD ARMBOARD 7.5X6 YLW CONV (MISCELLANEOUS) ×6 IMPLANT
PATTIES SURGICAL .5 X.5 (GAUZE/BANDAGES/DRESSINGS) IMPLANT
PATTIES SURGICAL .5 X1 (DISPOSABLE) ×3 IMPLANT
PRECEPT TULIPS (Neuro Prosthesis/Implant) ×4 IMPLANT
PROBE BALL TIP NVM5 SNG USE (BALLOONS) ×2 IMPLANT
PUTTY DBX 1CC (Putty) ×3 IMPLANT
PUTTY DBX 1CC DEPUY (Putty) IMPLANT
ROD PREBENT 40MM (Rod) ×4 IMPLANT
SCREW POLYAXIAL 6.5X50MM (Screw) ×2 IMPLANT
SCREW PRECEPT 6.5X40 (Screw) ×2 IMPLANT
SCREW PRECEPT POLY 7.5X45 (Screw) IMPLANT
SCREW PRECEPT POLY 7.5X45MM (Screw) ×3 IMPLANT
SCREW PRECEPT SET (Screw) ×8 IMPLANT
SCREW PRECEPT SHANK 6.5X50MM (Screw) ×2 IMPLANT
SCREW SHANK PRECEPT 7.5X45MM (Screw) ×2 IMPLANT
SHEET CONFORM 45LX20WX5H (Bone Implant) ×3 IMPLANT
SPONGE LAP 4X18 X RAY DECT (DISPOSABLE) ×4 IMPLANT
SPONGE SURGIFOAM ABS GEL 100 (HEMOSTASIS) ×2 IMPLANT
SURGIFLO TRUKIT (HEMOSTASIS) IMPLANT
SUT BONE WAX W31G (SUTURE) ×3 IMPLANT
SUT MON AB 3-0 SH 27 (SUTURE) ×3
SUT MON AB 3-0 SH27 (SUTURE) ×1 IMPLANT
SUT VIC AB 0 CT1 27 (SUTURE) ×3
SUT VIC AB 0 CT1 27XBRD ANBCTR (SUTURE) ×1 IMPLANT
SUT VIC AB 1 CT1 18XCR BRD 8 (SUTURE) ×1 IMPLANT
SUT VIC AB 1 CT1 8-18 (SUTURE) ×3
SUT VIC AB 1 CTX 36 (SUTURE) ×6
SUT VIC AB 1 CTX36XBRD ANBCTR (SUTURE) ×2 IMPLANT
SUT VIC AB 2-0 CT1 18 (SUTURE) ×3 IMPLANT
SYR BULB IRRIGATION 50ML (SYRINGE) ×3 IMPLANT
SYR CONTROL 10ML LL (SYRINGE) ×3 IMPLANT
TOWEL OR 17X24 6PK STRL BLUE (TOWEL DISPOSABLE) ×3 IMPLANT
TOWEL OR 17X26 10 PK STRL BLUE (TOWEL DISPOSABLE) ×3 IMPLANT
WATER STERILE IRR 1000ML POUR (IV SOLUTION) ×1 IMPLANT
YANKAUER SUCT BULB TIP NO VENT (SUCTIONS) ×3 IMPLANT

## 2013-10-09 NOTE — Anesthesia Postprocedure Evaluation (Signed)
Anesthesia Post Note  Patient: Mark [name]  Procedure(s) Performed: Procedure(s) (LRB): L4-5 DECOMPRESSION/FARAMINOTOMY REVISION  (N/A) TLIF L5-S1 (N/A)  Anesthesia type: General  Patient location: PACU  Post pain: Pain level controlled and Adequate analgesia  Post assessment: Post-op Vital signs reviewed, Patient's Cardiovascular Status Stable, Respiratory Function Stable, Patent Airway and Pain level controlled  Last Vitals:  Filed Vitals:   10/09/13 1815  BP: 107/59  Pulse: 60  Temp:   Resp: 6    Post vital signs: Reviewed and stable  Level of consciousness: awake, alert  and oriented  Complications: No apparent anesthesia complications

## 2013-10-09 NOTE — Transfer of Care (Signed)
Immediate Anesthesia Transfer of Care Note  Patient: Mark [name]  Procedure(s) Performed: Procedure(s): L4-5 DECOMPRESSION/FARAMINOTOMY REVISION  (N/A) TLIF L5-S1 (N/A)  Patient Location: PACU  Anesthesia Type:General  Level of Consciousness: awake  Airway & Oxygen Therapy: Patient Spontanous Breathing and Patient connected to nasal cannula oxygen  Post-op Assessment: Report given to PACU RN, Post -op Vital signs reviewed and stable and Patient moving all extremities X 4  Post vital signs: Reviewed and stable  Complications: No apparent anesthesia complications

## 2013-10-09 NOTE — H&P (Signed)
At this point in time, the patient did not get any significant relief with the recent injection. At this point we've had a long discussion about surgical intervention. I still do think that the 5-1 level is his major source of dysfunction; however, it is not unreasonable to address the severe foraminal stenosis on the right side at L4-5. I think, by doing a foraminotomy, we can alleviate some of the tension on the right L4 nerve root and, by doing a TLIF at L5-S1, we could decompress the left L5 and S1 nerve root and stabilize the collapsed level. We have gone over the risks which include infection, bleeding, nerve damage, nerve damage, death, stroke, paralysis, failure to heal, need for further surgery, ongoing or worse pain, loss of bowel or bladder control and need for further operations  H+P reviewed No change in clinical exam

## 2013-10-09 NOTE — Anesthesia Preprocedure Evaluation (Addendum)
Anesthesia Evaluation  Patient identified by MRN, date of birth, ID band Patient awake    Reviewed: Allergy & Precautions, H&P , NPO status , Patient's Chart, lab work & pertinent test results  History of Anesthesia Complications Negative for: history of anesthetic complications  Airway Mallampati: I TM Distance: >3 FB Neck ROM: Full    Dental  (+) Partial Upper, Partial Lower, Dental Advisory Given   Pulmonary asthma , former smoker,    Pulmonary exam normal       Cardiovascular hypertension, Pt. on medications and Pt. on home beta blockers     Neuro/Psych Anxiety    GI/Hepatic Neg liver ROS, hiatal hernia, GERD-  Medicated,  Endo/Other  negative endocrine ROS  Renal/GU negative Renal ROS     Musculoskeletal   Abdominal   Peds  Hematology   Anesthesia Other Findings   Reproductive/Obstetrics                          Anesthesia Physical Anesthesia Plan  ASA: II  Anesthesia Plan: General   Post-op Pain Management:    Induction: Intravenous  Airway Management Planned: Oral ETT  Additional Equipment:   Intra-op Plan:   Post-operative Plan: Extubation in OR  Informed Consent: I have reviewed the patients History and Physical, chart, labs and discussed the procedure including the risks, benefits and alternatives for the proposed anesthesia with the patient or authorized representative who has indicated his/her understanding and acceptance.   Dental advisory given  Plan Discussed with: CRNA, Anesthesiologist and Surgeon  Anesthesia Plan Comments:        Anesthesia Quick Evaluation

## 2013-10-09 NOTE — Brief Op Note (Signed)
10/09/2013  4:42 PM  PATIENT:  Mark [name]  63 y.o. male  PRE-OPERATIVE DIAGNOSIS:  DDD WITH STENOSIS/SLIP  POST-OPERATIVE DIAGNOSIS:  DDD WITH STENOSIS/SLIP  PROCEDURE:  Procedure(s): L4-5 DECOMPRESSION/FARAMINOTOMY REVISION  (N/A) TLIF L5-S1 (N/A)  SURGEON:  Surgeon(s) and Role:    * Melina Schools, MD - Primary  PHYSICIAN ASSISTANT:   ASSISTANTS: Benjiman Core   ANESTHESIA:   general  EBL:  Total I/O In: 2000 [I.V.:2000] Out: 900 [Urine:400; Blood:500]  BLOOD ADMINISTERED:none  DRAINS: none   LOCAL MEDICATIONS USED:  MARCAINE     SPECIMEN:  No Specimen  DISPOSITION OF SPECIMEN:  N/A  COUNTS:  YES  TOURNIQUET:  * No tourniquets in log *  DICTATION: .Other Dictation: Dictation Number [id number]  PLAN OF CARE: Admit to inpatient   PATIENT DISPOSITION:  PACU - hemodynamically stable.

## 2013-10-09 NOTE — Progress Notes (Signed)
Report given to Mark RN

## 2013-10-10 ENCOUNTER — Encounter (HOSPITAL_COMMUNITY): Payer: Self-pay

## 2013-10-10 ENCOUNTER — Inpatient Hospital Stay (HOSPITAL_COMMUNITY): Payer: 59

## 2013-10-10 MED ORDER — METHOCARBAMOL 1000 MG/10ML IJ SOLN
500.0000 mg | Freq: Four times a day (QID) | INTRAVENOUS | Status: DC | PRN
  Filled 2013-10-10: qty 5

## 2013-10-10 MED ORDER — HYDROMORPHONE HCL PF 1 MG/ML IJ SOLN
2.0000 mg | Freq: Three times a day (TID) | INTRAMUSCULAR | Status: DC | PRN
  Administered 2013-10-10: 2 mg via INTRAVENOUS
  Filled 2013-10-10: qty 2

## 2013-10-10 MED ORDER — METHOCARBAMOL 750 MG PO TABS
750.0000 mg | ORAL_TABLET | Freq: Four times a day (QID) | ORAL | Status: DC | PRN
Start: 2013-10-10 — End: 2013-10-12
  Administered 2013-10-10 – 2013-10-12 (×2): 750 mg via ORAL
  Filled 2013-10-10 (×2): qty 1

## 2013-10-10 NOTE — Progress Notes (Signed)
Patient doing well Increased pain this AM with manipulation for xray Pain under control D/c dilaudid Mobilization Xrays:  satisfactory

## 2013-10-10 NOTE — Care Management Note (Signed)
CARE MANAGEMENT NOTE 10/10/2013  Patient:  Mark [name], Mark [name]   Account Number:  [id number]  Date Initiated:  10/10/2013  Documentation initiated by:  Ricki Miller  Subjective/Objective Assessment:   63 yr old male s/p L4-L5 decompression.     Action/Plan:   Case manager spoke with patient concerning home health needs.  PT very week, PT will assess over next 2 days, Case manager will follow.   Anticipated DC Date:  10/12/2013   Anticipated DC Plan:  [place]  CM consult      [place]   Choice offered to / List presented to:  C-1 Patient   DME arranged  [facility]  3-N-1      DME agency  [facility].        Status of service:  In process, will continue to follow Medicare Important Message given?   (If response is "NO", the following Medicare IM given date fields will be blank) Date Medicare IM given:   Medicare IM given by:   Date Additional Medicare IM given:   Additional Medicare IM given by:    Discharge Disposition:    Per UR Regulation:  Reviewed for med. necessity/level of care/duration of stay

## 2013-10-10 NOTE — Progress Notes (Signed)
Utilization review completed.  

## 2013-10-10 NOTE — Evaluation (Signed)
Physical Therapy Evaluation Patient Details Name: Mark [name] MRN: [id number] DOB: 1950-09-30 Today's Date: 10/10/2013   History of Present Illness  63 y.o. male s/p L4-5 DECOMPRESSION/FARAMINOTOMY REVISION and L5-S1 decompression.  Clinical Impression  Patient is seen following the above procedure and presents with functional limitations due to the deficits listed below (see PT Problem List). Generalized weakness of LEs upon standing, requiring min assist for stability with poor balance. Currently recommending HHPT, however if patient does not progress to supervision level prior to d/c may need SNF for short term rehabilitation due to fall risk. Will follow up tomorrow for gait and stair training. Patient will benefit from skilled PT to increase their independence and safety with mobility to allow discharge to the venue listed below.       Follow Up Recommendations Home health PT;Supervision for mobility/OOB    Equipment Recommendations  3in1 (PT)    Recommendations for Other Services OT consult     Precautions / Restrictions Precautions Precautions: Back Precaution Booklet Issued: Yes (comment) Precaution Comments: Reviewed back precautions Required Braces or Orthoses: Spinal Brace Spinal Brace: Lumbar corset Restrictions Weight Bearing Restrictions: No      Mobility  Bed Mobility Overal bed mobility: Needs Assistance Bed Mobility: Rolling;Sidelying to Sit Rolling: Min guard Sidelying to sit: Min guard       General bed mobility comments: Min guard for safety. Educated on log roll. Able to perform towards left side with VC and use of rail.  Transfers Overall transfer level: Needs assistance Equipment used: Rolling walker (2 wheeled) Transfers: Sit to/from Stand Sit to Stand: Min assist;From elevated surface         General transfer comment: Min assist for boost and balance; VC for hand placement. Legs somewhat unstable but able to use UE to maintain upright  posture. Posterior lean with static standing requiring min assist if no UE support. Good control with descent to sit. Performed x2 from elevated bed surface.  Ambulation/Gait Ambulation/Gait assistance: Min assist Ambulation Distance (Feet): 8 Feet Assistive device: Rolling walker (2 wheeled) Gait Pattern/deviations: Step-through pattern;Decreased stride length   Gait velocity interpretation: Below normal speed for age/gender General Gait Details: Very slow and guarded. Educated on safe DME use with rolling walker. VC for upright posture and to maintain back precautions while turning with walker. Distance limited by back pain and difficulty with LE stability.  Stairs            Wheelchair Mobility    Modified Rankin (Stroke Patients Only)       Balance Overall balance assessment: Needs assistance Sitting-balance support: No upper extremity supported;Feet supported Sitting balance-Leahy Scale: Fair   Postural control: Posterior lean (in standing) Standing balance support: Single extremity supported Standing balance-Leahy Scale: Poor                               Pertinent Vitals/Pain Pain Assessment: 0-10 Pain Score: 8  Pain Location: back Pain Descriptors / Indicators: Constant Pain Intervention(s): Limited activity within patient's tolerance;Monitored during session;Repositioned    Home Living Family/patient expects to be discharged to:: Private residence Living Arrangements: Spouse/significant other Available Help at Discharge: Family;Available 24 hours/day Type of Home: House Home Access: Stairs to enter Entrance Stairs-Rails: None Entrance Stairs-Number of Steps: 3 Home Layout: One level Home Equipment: Walker - 2 wheels;Bedside commode      Prior Function Level of Independence: Needs assistance   Gait / Transfers Assistance Needed: intermittent use of  rolling walker for ambulation  ADL's / Homemaking Assistance Needed: Needed assist with  shower        Hand Dominance   Dominant Hand: Right    Extremity/Trunk Assessment   Upper Extremity Assessment: Defer to OT evaluation           Lower Extremity Assessment: Generalized weakness (L5 and S1 dermatome - Poor sensation w/ light touch)         Communication   Communication: No difficulties  Cognition Arousal/Alertness: Awake/alert Behavior During Therapy: WFL for tasks assessed/performed Overall Cognitive Status: Within Functional Limits for tasks assessed                      General Comments General comments (skin integrity, edema, etc.): Pt reports improved sensation in LLE following surgery. reports mild nausea and a high level of pain at present. Nurse notified. Educated and practiced donning /doffing lumbar corset.    Exercises General Exercises - Lower Extremity Ankle Circles/Pumps: AROM;Both;10 reps;Seated Long Arc Quad: Strengthening;Both;10 reps;Seated Hip Flexion/Marching: Strengthening;Both;10 reps;Seated      Assessment/Plan    PT Assessment Patient needs continued PT services  PT Diagnosis Difficulty walking;Abnormality of gait;Generalized weakness;Acute pain   PT Problem List Decreased strength;Decreased range of motion;Decreased activity tolerance;Decreased balance;Decreased mobility;Decreased knowledge of use of DME;Decreased knowledge of precautions;Impaired sensation;Pain  PT Treatment Interventions DME instruction;Gait training;Stair training;Functional mobility training;Therapeutic activities;Therapeutic exercise;Balance training;Neuromuscular re-education;Patient/family education;Modalities   PT Goals (Current goals can be found in the Care Plan section) Acute Rehab PT Goals Patient Stated Goal: Go home - be able to fish again PT Goal Formulation: With patient Time For Goal Achievement: 10/17/13 Potential to Achieve Goals: Good    Frequency Min 5X/week   Barriers to discharge        Co-evaluation                End of Session Equipment Utilized During Treatment: Back brace Activity Tolerance: Patient limited by pain Patient left: in chair;with call bell/phone within reach Nurse Communication: Mobility status;Patient requests pain meds         Time: 3329-5188 PT Time Calculation (min): 46 min   Charges:   PT Evaluation $Initial PT Evaluation Tier I: 1 Procedure PT Treatments $Therapeutic Activity: 8-22 mins $Self Care/Home Management: 8-22   PT G Codes:        Elayne Snare, [place]   Ellouise Newer 10/10/2013, 1:32 PM

## 2013-10-10 NOTE — Progress Notes (Signed)
Subjective: C/o back pain.  Left leg feels better.     Objective: Vital signs in last 24 hours: Temp:  [97.4 F (36.3 C)-98.6 F (37 C)] 97.6 F (36.4 C) (08/13 0600) Pulse Rate:  [56-88] 78 (08/13 0600) Resp:  [6-28] 12 (08/13 0600) BP: (100-139)/(54-90) 114/56 mmHg (08/13 0600) SpO2:  [95 %-100 %] 98 % (08/13 0600) FiO2 (%):  [44 %] 44 % (08/12 2009) Weight:  [93.895 kg (207 lb)] 93.895 kg (207 lb) (08/12 0955)  Intake/Output from previous day: 08/12 0701 - 08/13 0700 In: 2800 [I.V.:2500] Out: 2000 [Urine:1500; Blood:500] Intake/Output this shift:    No results found for this basename: HGB,  in the last 72 hours No results found for this basename: WBC, RBC, HCT, PLT,  in the last 72 hours No results found for this basename: NA, K, CL, CO2, BUN, CREATININE, GLUCOSE, CALCIUM,  in the last 72 hours No results found for this basename: LABPT, INR,  in the last 72 hours  Exam:  Dressing C/D/I.  bilat calves nontender.  Still has some left ant tib, gastroc and ehl weakness.    Assessment/Plan: PT protocol.  Anticipate d/c home fri or sat.  Will increase robaxin to 750mg .     Benjiman Core M 10/10/2013, 8:29 AM

## 2013-10-10 NOTE — Progress Notes (Signed)
Occupational Therapy Evaluation Patient Details Name: Mark [name] MRN: [id number] DOB: 01-13-1951 Today's Date: 10/10/2013    History of Present Illness 63 y.o. male s/p L4-5 DECOMPRESSION/FARAMINOTOMY REVISION and L5-S1 decompression.   Clinical Impression   PTA pt lived at home and required assistance for LB bathing. Pt ambulated with RW PRN for increased safety. Pt currently requires assist for LB ADLs and functional mobility is limited by pain. Pt would benefit from skilled OT to address LB dressing and toilet/tub transfers for increased safety at home.     Follow Up Recommendations  No OT follow up;Supervision/Assistance - 24 hour    Equipment Recommendations  3 in 1 bedside comode       Precautions / Restrictions Precautions Precautions: Back Precaution Booklet Issued: Yes (comment) Precaution Comments: Educated pt on 3/3 back precautions and incorporating into ADLs.  Required Braces or Orthoses: Spinal Brace Spinal Brace: Lumbar corset Restrictions Weight Bearing Restrictions: No      Mobility Bed Mobility Overal bed mobility: Needs Assistance Bed Mobility: Rolling;Sidelying to Sit;Sit to Sidelying Rolling: Supervision Sidelying to sit: Min guard     Sit to sidelying: Min assist General bed mobility comments: VC's for technique and sequencing. Min (A) to elevate Bil LEs onto bed during sit>sidelying. Pt limited by anxiety and pain.  Transfers Overall transfer level: Needs assistance Equipment used: Rolling walker (2 wheeled) Transfers: Sit to/from Stand Sit to Stand: From elevated surface;Min assist         General transfer comment: Min (A) to stabilize RW. VC's for hand placement. Encouraged pt to push through Bil LEs to stand.          ADL Overall ADL's : Needs assistance/impaired Eating/Feeding: Independent;Sitting   Grooming: Min guard;Standing   Upper Body Bathing: Set up;Sitting   Lower Body Bathing: Sit to/from stand;Minimal  assistance   Upper Body Dressing : Set up;Sitting (including back brace)   Lower Body Dressing: Minimal assistance;Sit to/from stand   Toilet Transfer: Minimal assistance;Ambulation;RW   Toileting- Clothing Manipulation and Hygiene: Minimal assistance;Sit to/from stand   Tub/ Shower Transfer: Walk-in shower;Min guard;Ambulation;Rolling walker   Functional mobility during ADLs: Min guard;Rolling walker General ADL Comments: Pt appeared anxious with ambulation and encouraged deep breathing and relaxation techniques. Pt has good mobility and educated on pushing up from bed to stand. Pt performed bed mobility and ambulated to door before limited by pain and returned to bed. Educated pt on compensatory techniques for LB ADLs, fall prevention, and energy conservation. Educated pt on not sitting >30-45 minutes and not sleeping in recliner.      Vision  Pt wears glasses at all times. Pt reports no change from baseline.  No apparent visual deficits.                  Perception Perception Perception Tested?: No   Praxis Praxis Praxis tested?: Within functional limits    Pertinent Vitals/Pain Pain Assessment: 0-10 Pain Score: 8  Pain Location: lower back Pain Descriptors / Indicators: Constant;Tender;Throbbing Pain Intervention(s): Limited activity within patient's tolerance;Monitored during session;Repositioned;Patient requesting pain meds-RN notified     Hand Dominance Right   Extremity/Trunk Assessment Upper Extremity Assessment Upper Extremity Assessment: Overall WFL for tasks assessed   Lower Extremity Assessment Lower Extremity Assessment: Defer to PT evaluation   Cervical / Trunk Assessment Cervical / Trunk Assessment: Normal   Communication Communication Communication: No difficulties   Cognition Arousal/Alertness: Awake/alert Behavior During Therapy: Anxious Overall Cognitive Status: Within Functional Limits for tasks assessed  Home Living Family/patient expects to be discharged to:: Private residence Living Arrangements: Spouse/significant other Available Help at Discharge: Family;Available 24 hours/day Type of Home: House Home Access: Stairs to enter [employer] of Steps: 3 Entrance Stairs-Rails: None Home Layout: One level     Bathroom Shower/Tub: Occupational psychologist: Standard     Home Equipment: Environmental consultant - 2 wheels;Bedside commode          Prior Functioning/Environment Level of Independence: Needs assistance  Gait / Transfers Assistance Needed: intermittent use of rolling walker for ambulation ADL's / Homemaking Assistance Needed: Needed assist with shower of LB        OT Diagnosis: Generalized weakness;Acute pain   OT Problem List: Decreased strength;Decreased range of motion;Decreased activity tolerance;Impaired balance (sitting and/or standing);Decreased safety awareness;Decreased knowledge of use of DME or AE;Decreased knowledge of precautions;Pain   OT Treatment/Interventions: Self-care/ADL training;Therapeutic exercise;Energy conservation;DME and/or AE instruction;Therapeutic activities;Patient/family education;Balance training    OT Goals(Current goals can be found in the care plan section) Acute Rehab OT Goals Patient Stated Goal: To go home and be independent OT Goal Formulation: With patient Time For Goal Achievement: 10/17/13 Potential to Achieve Goals: Good ADL Goals Pt Will Perform Grooming: with modified independence;standing Pt Will Perform Lower Body Bathing: with modified independence;sit to/from stand;with adaptive equipment Pt Will Perform Lower Body Dressing: with modified independence;with adaptive equipment;sit to/from stand Pt Will Transfer to Toilet: with modified independence;ambulating;bedside commode Pt Will Perform Toileting - Clothing Manipulation and hygiene: with modified independence;sit to/from stand Pt Will Perform  Tub/Shower Transfer: Shower transfer;with modified independence;ambulating;3 in 1;rolling walker  OT Frequency: Min 2X/week    End of Session Equipment Utilized During Treatment: Gait belt;Rolling walker;Back brace Nurse Communication: Mobility status;Precautions  Activity Tolerance: Patient limited by pain Patient left: in bed;with call bell/phone within reach   Time: 1312-1400 OT Time Calculation (min): 48 min Charges:  OT General Charges $OT Visit: 1 Procedure OT Evaluation $Initial OT Evaluation Tier I: 1 Procedure OT Treatments $Self Care/Home Management : 38-52 mins  Juluis Rainier [phone] 10/10/2013, 3:33 PM

## 2013-10-10 NOTE — Op Note (Signed)
NAMEEDELMIRO, Mark [name] NO.:  [id number]  MEDICAL RECORD NO.:  [id number]  LOCATION:  5N04C                        FACILITY:  [facility]  PHYSICIAN:  Dahlia Bailiff, MD    DATE OF BIRTH:  1950/09/22  DATE OF PROCEDURE:  10/09/2013 DATE OF DISCHARGE:                              OPERATIVE REPORT   PREOPERATIVE DIAGNOSIS:  Recurrent lumbar spinal stenosis and degenerative disk disease, L4-5, L5-S1 with collapse and degeneration of the disk at L5-S1.  POSTOPERATIVE DIAGNOSIS:  Recurrent lumbar spinal stenosis and degenerative disk disease, L4-5, L5-S1 with collapse and degeneration of the disk at L5-S1.  OPERATIVE PROCEDURES: 1. Revision lumbar decompression left side L4-5 with L4 laminotomy and     foraminotomy and facetectomy. 2. Gill decompression, L5-S1, left side with complete removal of L5     facet and decompression, revision. 3. Complete diskectomy, L5-S1 with implantation of biomechanical     intervertebral device. 4. Posterolateral arthrodesis using autograft bone at L5-S1 and     segmental pedicle screw fixation at L5-S1 for fusion.  COMPLICATIONS:  None.  CONDITION:  Stable.  HISTORY:  This is a very pleasant 63 year old gentleman who had previous surgery 2-3 years ago for spinal stenosis.  The patient has had ongoing back, buttock, and radicular left leg pain. X-rays confirmed recurrent spinal stenosis with compression along the lateral recess at L4-5 and significant foraminal compromise and advanced degenerative disk disease at L5-S1.  Despite appropriate conservative management, his pain and loss of function continued, and so he elected to proceed with surgery.  All appropriate risks, benefits, and alternatives were discussed and consent was obtained.  FIRST ASSISTANT:  Alyson Locket. Velora Heckler.  OPERATIVE NOTE:  The patient was brought to the operating room, placed supine on the operating table.  After successful induction of general anesthesia  and endotracheal intubation, TEDs, SCDs, and Foley were inserted.  Needles were placed in the lower extremity for intraoperative neuromonitoring.  This included EMGs and SSEPs.  The patient was turned prone onto the Wilson frame.  All bony prominences were well padded and the back was prepped and draped in standard fashion.  Time-out was taken confirming the patient, procedure, and all other pertinent important data.  Once this was completed, I then identified the lateral border of the right L5 and S1 pedicles.  These were marked and then I did the same thing on the left side.  Since he was having mostly left radicular leg pain, I elected to do TLIF on the left side.  I started on the right.  A small incision was made over the lateral aspect of the L5 pedicle and a Jamshidi needle was advanced percutaneously down to the junction of the transverse process and the facet.  I confirmed satisfactory position in the AP and lateral planes and then advanced the Jamshidi needle under fluoroscopic guidance and with live EMG monitoring.  Once the tip of the trocar was at the medial border of the pedicle on the AP view I switched to the lateral and confirmed that I was beyond the posterior margin of the vertebral body.  I then advanced it into the vertebral body.  I then aspirated about 10 mL of  blood and mixed it with the CONFORM allograft sheet.  I then placed the guide pin down the cannulated pedicle and then repeated the procedure at S1.  Both the AP and lateral films were satisfactory.  The screws appeared to be in good position.  There was no adverse complications.  I then tapped over the screws and then at S1 placed a 7.5 diameter 40 mm screw and at L5 placed a 50 mm screw, 6.5.  Once both screws were in place, I then stimulated both screws, and there was no abnormal neuromonitoring activity to suggest breach.  I then went to the contralateral side and I made an incision connecting the 2  pedicles.  This was a standard posterior lateral approach (Wiltse approach).  Sharp dissection was carried out down to the deep fascia. Deep fascia was sharply incised and bluntly dissected through the paraspinal musculature until I could palpate the L5-S1 and L4-5 facet complexes.  Once I was able to do this, I mobilized the paraspinal muscles.  I then placed the Jamshidi needle on the pedicle on the transverse processes of L5.  Once I was properly positioned using the same technique used in the contralateral side, I advanced the Jamshidi needle and then placed a cannula.  I then repeated this at S1.  I then placed the same size screws and these were now attached to the retracting blades for visualization.  I then connected it to the holder and I had a good view of the posterolateral aspect of the spine.  Because of the previous decompression, I identified the area where the pars was and then cleaned the scar tissue from the pars.  This allowed me to trace my way down to the L5-S1 facet complex.  Once I was there, I was then able to trace my way medially up the L5 lamina.  Once I had the L5 lamina placed, I could then remove the bulk of the scar tissue as I had my appropriate landmarks.  With this done, I could then trace from the L4-5 facet complex to the L4 lamina.  At this point, I had the posterolateral aspect of the L4-L5 and L5-S1 exposed.  I then used a fine curette to develop a plane between the lamina of L5.  I then used a 2 and 3 mm Kerrison rongeur to perform a complete laminectomy on the left side of L5.  I did eventually find native ligamentum flavum and I was able to exploit this and then develop plane to the thecal sac.  I then resected that and completed my laminectomy and removed the ligamentum flavum.  At this point, I had a good visualization.  I then resected the entire pars and identified the L5 nerve root.  I then removed the inferior aspect of the L5 facet complex.   I then resected the overhanging osteophyte from the superior S1 facet complex.  I could now visualize the S1 nerve root, the medial and superior aspect of the S1 pedicle, and the L5 nerve root in the foramen and the inferior aspect of the L5 pedicle.  My posterolateral Gill decompression was complete. I then traced the L5 nerve root medially and then used a 2 and 3 mm Kerrison to continue my decompression.  I then performed a laminotomy of L4 on the left-hand side and again removed the scar tissue starting in the known plane and proceeding into the scar.  At this point, I had a good decompression of the entire L5 nerve  root and I could palpate out the L4 foramen and I could palpate the inferior aspect of the L4 pedicle confirming I had a satisfactory posterolateral decompression at L4-5.  At this point, the decompression procedures were completed and I proceeded with the diskectomy.  I protected the L5 and S1 nerve roots, retracted the thecal sac, and then incised the L5-S1 disk space.  Using a combination of pituitary rongeurs, curettes, and Kerrison rongeurs, I removed all of the disk material.  I scraped the endplates until I had bleeding subchondral bone.  Once this was done, I then used various sizing devices and elected to use a size 8 extra long Titan titanium intervertebral cage.  This was packed with the local bone that I had harvested from the decompression as well as some DBX.  The cage was then [facility] into the appropriate position and then kicked into the horizontal position without problems. Final x-rays were satisfactory.  The intervertebral cage was in good position.  At this point, I then removed the kyphosis from the [place] frame and restored the lumbar lordosis.  I then disconnected the retractors from the screw heads, placed the polyaxial heads appropriately, and then measured for the rod.  I placed 40 mm length rod and then locked it in place with the top locking  nuts.  Both were torqued off appropriately.  I then irrigated this wound copiously with normal saline and I again after placing the cage checked with my [facility] along the S1, L5, and L4 foramen to confirm that there was still an adequate decompression, there was no misplaced bone or anything that could be causing ongoing pressure.  I then placed a thrombin-soaked Gelfoam patty over the exposed thecal sac and then closed the deep fascia with interrupted #1 Vicryl sutures, superficial with 2-0 Vicryl sutures, and skin with 3-0 Monocryl.  I then went back to the right hand side, measured, and placed the same size rod on this side.  Again it was torqued down appropriately according to [employer].  I then placed the remaining bone graft into the posterolateral gutter after disrupting the facet complex.  I then closed the small incisions with interrupted 2-0 Vicryl sutures and 3-0 Monocryl.  Steri-Strips and dry dressing were applied.  The patient was extubated, transferred to PACU without incident.  At the end of the case, all needle and sponge counts were correct.  There were no adverse intraoperative events.  My first assistant, Benjiman Core was instrumental in assisting me with retraction, visualization, suction, and wound closure.     Dahlia Bailiff, MD     DDB/MEDQ  D:  10/09/2013  T:  10/10/2013  Job:  [id number]

## 2013-10-11 MED ORDER — DOCUSATE SODIUM 100 MG PO CAPS: 100.0000 mg | ORAL_CAPSULE | Freq: Two times a day (BID) | ORAL | Status: DC

## 2013-10-11 MED ORDER — OXYCODONE-ACETAMINOPHEN 10-325 MG PO TABS: 1.0000 | ORAL_TABLET | ORAL | Status: DC | PRN

## 2013-10-11 MED ORDER — POLYETHYLENE GLYCOL 3350 17 G PO PACK: 17.0000 g | PACK | Freq: Every day | ORAL | Status: DC

## 2013-10-11 MED ORDER — DSS 100 MG PO CAPS: 100.0000 mg | ORAL_CAPSULE | Freq: Two times a day (BID) | ORAL | Status: DC

## 2013-10-11 MED ORDER — METHOCARBAMOL 500 MG PO TABS: 500.0000 mg | ORAL_TABLET | Freq: Every day | ORAL | Status: DC

## 2013-10-11 MED ORDER — BISACODYL 10 MG RE SUPP
10.0000 mg | Freq: Once | RECTAL | Status: AC
  Administered 2013-10-11: 10 mg via RECTAL
  Filled 2013-10-11: qty 1

## 2013-10-11 MED ORDER — ONDANSETRON 4 MG PO TBDP: 4.0000 mg | ORAL_TABLET | Freq: Three times a day (TID) | ORAL | Status: DC | PRN

## 2013-10-11 NOTE — Care Management Note (Signed)
CARE MANAGEMENT NOTE 10/11/2013  Patient:  Mark [name], Mark [name]   Account Number:  [id number]  Date Initiated:  10/10/2013  Documentation initiated by:  Ricki Miller  Subjective/Objective Assessment:   63 yr old male s/p L4-L5 decompression.     Action/Plan:   Case manager spoke with patient concerning home health needs.  PT very week, PT will assess over next 2 days, Case manager will follow.   Anticipated DC Date:  10/12/2013   Anticipated DC Plan:  [place] [facility]  CM consult      [place]   Choice offered to / List presented to:  C-1 Patient   DME arranged  [facility]  3-N-1      DME agency  [place] arranged  [place].   Status of service:  Completed, signed off  Discharge Disposition:  [facility]  Per UR Regulation:  Reviewed for med. necessity/level of care/duration of stay

## 2013-10-11 NOTE — Progress Notes (Signed)
Physical Therapy Treatment Patient Details Name: Mark [name] MRN: [id number] DOB: [birth year] Today's Date: 10/11/2013    History of Present Illness 63 y.o. male s/p L4-5 DECOMPRESSION/FARAMINOTOMY REVISION and L5-S1 decompression.    PT Comments    When PT entered room pt bending over to floor to pick up call button that had dropped.  Pt ed on back precautions and avoiding bending.  Pt with much improved mobility today and able to ambulate down hallway.  Pt telling this PT that the home he will be staying in has a handicap ramp and that he does not have to perform stairs.  Pt needed multiple cues about back precautions throughout session.  Feel pt would benefit from staying until tomorrow for additional therapy.  Will continue to follow.    Follow Up Recommendations  Home health PT;Supervision for mobility/OOB     Equipment Recommendations  3in1 (PT)    Recommendations for Other Services       Precautions / Restrictions Precautions Precautions: Back Precaution Booklet Issued: Yes (comment) Precaution Comments: Reviewed back precautions Required Braces or Orthoses: Spinal Brace Spinal Brace: Lumbar corset Restrictions Weight Bearing Restrictions: No    Mobility  Bed Mobility               General bed mobility comments: pt sitting EOB.    Transfers Overall transfer level: Needs assistance Equipment used: Rolling walker (2 wheeled) Transfers: Sit to/from Stand Sit to Stand: Min guard         General transfer comment: cues for UE use and back precautions.    Ambulation/Gait Ambulation/Gait assistance: Min guard Ambulation Distance (Feet): 160 Feet Assistive device: Rolling walker (2 wheeled) Gait Pattern/deviations: Step-through pattern;Decreased stride length;Trunk flexed   Gait velocity interpretation: Below normal speed for age/gender General Gait Details: cues for upright posture, safe use of RW, and back precautions during mobility.     Stairs            Wheelchair Mobility    Modified Rankin (Stroke Patients Only)       Balance Overall balance assessment: Needs assistance Sitting-balance support: No upper extremity supported;Feet supported Sitting balance-Leahy Scale: Good     Standing balance support: Single extremity supported Standing balance-Leahy Scale: Poor                      Cognition Arousal/Alertness: Awake/alert Behavior During Therapy: WFL for tasks assessed/performed Overall Cognitive Status: No family/caregiver present to determine baseline cognitive functioning                      Exercises      General Comments        Pertinent Vitals/Pain Pain Assessment: 0-10 Pain Score: 3  Pain Location: back Pain Descriptors / Indicators: Aching Pain Intervention(s): Premedicated before session;Repositioned    Home Living                      Prior Function            PT Goals (current goals can now be found in the care plan section) Acute Rehab PT Goals Patient Stated Goal: Go home - be able to fish again PT Goal Formulation: With patient Time For Goal Achievement: 10/17/13 Potential to Achieve Goals: Good Progress towards PT goals: Progressing toward goals    Frequency  Min 5X/week    PT Plan Current plan remains appropriate    Co-evaluation  End of Session Equipment Utilized During Treatment: Gait belt;Back brace Activity Tolerance: Patient tolerated treatment well Patient left: in chair;with call bell/phone within reach     Time: 0908-0927 PT Time Calculation (min): 19 min  Charges:  $Gait Training: 8-22 mins                    G CodesCatarina Hartshorn, [place] 10/11/2013, 9:53 AM

## 2013-10-11 NOTE — Progress Notes (Signed)
[facility]  Patient Status: New  [facility] is providing the following services: PT  If patient discharges after hours, please call [phone].   Consepcion Hearing 10/11/2013, 11:12 AM

## 2013-10-11 NOTE — Progress Notes (Signed)
Subjective: Patient seen this morning.  Feeling better today.  Has not been up much with PT.     Objective: Vital signs in last 24 hours: Temp:  [97.6 F (36.4 C)-98.1 F (36.7 C)] 97.6 F (36.4 C) (08/14 0550) Pulse Rate:  [62-74] 62 (08/14 0550) Resp:  [14-16] 15 (08/14 0550) BP: (116-126)/(68-69) 116/69 mmHg (08/14 0550) SpO2:  [95 %-96 %] 96 % (08/14 0550)  Intake/Output from previous day: 08/13 0701 - 08/14 0700 In: 1080 [P.O.:1080] Out: 500 [Urine:500] Intake/Output this shift: Total I/O In: 240 [P.O.:240] Out: -   No results found for this basename: HGB,  in the last 72 hours No results found for this basename: WBC, RBC, HCT, PLT,  in the last 72 hours No results found for this basename: NA, K, CL, CO2, BUN, CREATININE, GLUCOSE, CALCIUM,  in the last 72 hours No results found for this basename: LABPT, INR,  in the last 72 hours  Exam:  Wounds look good.  steris intact. No drainage or signs of infection.  Left LE strength improving.  bilat calves nontender.    Assessment/Plan: Continue to mobilize.  Stressed to patient the importance of this.  Anticipate d/c home Saturday.  No BM x a few days.  Will give dulcolax supp.     Jody Aguinaga M 10/11/2013, 11:39 AM

## 2013-10-12 NOTE — Progress Notes (Signed)
Physical Therapy Treatment Patient Details Name: Mark [name] MRN: [id number] DOB: 1950-04-03 Today's Date: 10/12/2013    History of Present Illness 63 y.o. male s/p L4-5 DECOMPRESSION/FARAMINOTOMY REVISION and L5-S1 decompression.    PT Comments    Pt moving great today.  Demos good balance and use of RW.  Pt ready for D/C from PT stand point.    Follow Up Recommendations  Home health PT;Supervision for mobility/OOB     Equipment Recommendations  3in1 (PT)    Recommendations for Other Services       Precautions / Restrictions Precautions Precautions: Back Precaution Comments: Pt recalled 3/3 back precautions and educated on incorporating into ADLs Required Braces or Orthoses: Spinal Brace Spinal Brace: Lumbar corset Restrictions Weight Bearing Restrictions: No    Mobility  Bed Mobility Overal bed mobility: Modified Independent                Transfers Overall transfer level: Modified independent Equipment used: Rolling walker (2 wheeled) Transfers: Sit to/from Stand Sit to Stand: Modified independent (Device/Increase time)         General transfer comment: Mod I with RW. Pt with significant progress. Pt able to ambulate short distances without RW and good steady balance. Reinforced that pt should use RW for longer distances for increased safety.   Ambulation/Gait Ambulation/Gait assistance: Modified independent (Device/Increase time) Ambulation Distance (Feet): 300 Feet Assistive device: Rolling walker (2 wheeled) Gait Pattern/deviations: Step-through pattern;Decreased stride length   Gait velocity interpretation: Below normal speed for age/gender General Gait Details: cueing for upright posture and following back precautions during mobility.  pt indicates feeling much better today ambulating.     Stairs            Wheelchair Mobility    Modified Rankin (Stroke Patients Only)       Balance Overall balance assessment: No apparent  balance deficits (not formally assessed)                                  Cognition Arousal/Alertness: Awake/alert Behavior During Therapy: WFL for tasks assessed/performed Overall Cognitive Status: No family/caregiver present to determine baseline cognitive functioning                      Exercises      General Comments        Pertinent Vitals/Pain Pain Assessment: No/denies pain    Home Living                      Prior Function            PT Goals (current goals can now be found in the care plan section) Acute Rehab PT Goals PT Goal Formulation: With patient Time For Goal Achievement: 10/17/13 Potential to Achieve Goals: Good Progress towards PT goals: Progressing toward goals    Frequency  Min 5X/week    PT Plan Current plan remains appropriate    Co-evaluation             End of Session Equipment Utilized During Treatment: Gait belt;Back brace Activity Tolerance: Patient tolerated treatment well Patient left: in bed;with call bell/phone within reach     Time: 0802-0820 PT Time Calculation (min): 18 min  Charges:  $Gait Training: 8-22 mins                    G Codes:      Mark [name],  Mark [name], Mark [name] 10/12/2013, 10:27 AM

## 2013-10-12 NOTE — Plan of Care (Signed)
Problem: Consults Goal: Diagnosis - Spinal Surgery Outcome: Completed/Met Date Met:  10/12/13 Thoraco/Lumbar Spine Fusion L4-5, L5-S1

## 2013-10-12 NOTE — Progress Notes (Signed)
Occupational Therapy Treatment Patient Details Name: Mark [name] MRN: [id number] DOB: 1950-11-22 Today's Date: 10/12/2013    History of present illness 63 y.o. male s/p L4-5 DECOMPRESSION/FARAMINOTOMY REVISION and L5-S1 decompression.   OT comments  Pt seen today for ADLs and functional mobility. Pt made significant progress since last OT session and is now at Mod I level for ADLs and transfers. Encouraged pt to continue to use RW for longer distances, however pt was able to ambulate short distances in room with good balance. No further acute OT needs.    Follow Up Recommendations  No OT follow up;Supervision/Assistance - 24 hour          Precautions / Restrictions Precautions Precautions: Back Precaution Comments: Pt recalled 3/3 back precautions and educated on incorporating into ADLs Required Braces or Orthoses: Spinal Brace Spinal Brace: Lumbar corset Restrictions Weight Bearing Restrictions: No       Mobility Bed Mobility Overal bed mobility: Modified Independent                Transfers Overall transfer level: Modified independent Equipment used: Rolling walker (2 wheeled)             General transfer comment: Mod I with RW. Pt with significant progress. Pt able to ambulate short distances without RW and good steady balance. Reinforced that pt should use RW for longer distances for increased safety.     Balance Overall balance assessment: No apparent balance deficits (not formally assessed)                                 ADL Overall ADL's : Modified independent                                       General ADL Comments: Pt with significant progress since last OT session. Pt able to don/doff socks by crossing LEs while maintaining back precautions. Pt is more confident with mobility and encouraged pt to take frequent small walks during the day.                 Cognition  Arousal/Alertness:  Awake/Alert Behavior During Therapy: WFL for tasks assessed/performed Overall Cognitive Status: No family/caregiver present to determine baseline cognitive functioning                                    Pertinent Vitals/ Pain       Pain Assessment: No/denies pain         Frequency   N/A; D/C from OT    Progress Toward Goals  OT Goals(current goals can now be found in the care plan section)  Progress towards OT goals: Goals met/education completed, patient discharged from [facility] All goals met and education completed, patient discharged from [facility] During Treatment: Rolling walker;Back brace   Activity Tolerance Patient tolerated treatment well   Patient Left in chair;with call bell/phone within reach   Nurse Communication Other (comment) (pt ready for d/c from OT standpoint)        Time: 6270-3500 OT Time Calculation (min): 34 min  Charges: OT General Charges $OT Visit: 1 Procedure OT Treatments $Self Care/Home Management : 23-37 mins  Mark [name]  [name] [phone] 10/12/2013, 10:15 AM

## 2013-10-12 NOTE — Progress Notes (Signed)
Subjective: 3 Days Post-Op Procedure(s) (LRB): L4-5 DECOMPRESSION/FARAMINOTOMY REVISION  (N/A) TLIF L5-S1 (N/A) Patient reports pain as 1 on 0-10 scale.Doing fine. Up in chair and shaving. Will DC    Objective: Vital signs in last 24 hours: Temp:  [97.6 F (36.4 C)-98 F (36.7 C)] 97.6 F (36.4 C) (08/15 0521) Pulse Rate:  [57-71] 57 (08/15 0521) Resp:  [14-18] 18 (08/15 0521) BP: (123-133)/(66-75) 127/66 mmHg (08/15 0521) SpO2:  [96 %-99 %] 99 % (08/15 0521)  Intake/Output from previous day: 08/14 0701 - 08/15 0700 In: 1200 [P.O.:1200] Out: -  Intake/Output this shift:    No results found for this basename: HGB,  in the last 72 hours No results found for this basename: WBC, RBC, HCT, PLT,  in the last 72 hours No results found for this basename: NA, K, CL, CO2, BUN, CREATININE, GLUCOSE, CALCIUM,  in the last 72 hours No results found for this basename: LABPT, INR,  in the last 72 hours  Neurovascular intact  Assessment/Plan: 3 Days Post-Op Procedure(s) (LRB): L4-5 DECOMPRESSION/FARAMINOTOMY REVISION  (N/A) TLIF L5-S1 (N/A) Discharge home with home health  Latrica Clowers A 10/12/2013, 7:43 AM

## 2013-10-14 ENCOUNTER — Encounter (HOSPITAL_COMMUNITY): Payer: Self-pay | Admitting: Orthopedic Surgery

## 2013-10-17 NOTE — Discharge Summary (Signed)
Patient ID: Mark [name] MRN: [id number] DOB/AGE: 03/03/1950 63 y.o.  Admit date: 10/09/2013 Discharge date: 10/17/2013  Admission Diagnoses:  Active Problems:   Back pain   Discharge Diagnoses:  Active Problems:   Back pain  status post Procedure(s): L4-5 DECOMPRESSION/FARAMINOTOMY REVISION  TLIF L5-S1  Past Medical History  Diagnosis Date  . HYPERTENSION 03/07/2007  . Headache(784.0) 03/07/2007  . PROSTATE CANCER, HX OF 03/07/2007  . ASTHMA 03/12/2007  . HYPERLIPIDEMIA 06/11/2007  . GERD 04/02/2009  . HIATAL HERNIA 04/28/2009  . TRANSAMINASES, SERUM, ELEVATED 04/28/2009  . Gastroparesis 05/01/2009  . Anxiety state, unspecified 09/01/2009  . Hiatal hernia   . Ulcer     as teenager  . Tubular adenoma of colon   . Polyp of colon, hyperplastic   . Pneumonia     hx of  . Cancer     prostate    Surgeries: Procedure(s): L4-5 DECOMPRESSION/FARAMINOTOMY REVISION  TLIF L5-S1 on 10/09/2013   Consultants:    Discharged Condition: Improved  Hospital Course: Mark [name] is an 63 y.o. male who was admitted 10/09/2013 for operative treatment of lumbar stenosis.  Patient failed conservative treatments (please see the history and physical for the specifics) and had severe unremitting pain that affects sleep, daily activities and work/hobbies. After pre-op clearance, the patient was taken to the operating room on 10/09/2013 and underwent  Procedure(s): L4-5 DECOMPRESSION/FARAMINOTOMY REVISION  TLIF L5-S1.    Patient was given perioperative antibiotics:  Anti-infectives   Start     Dose/Rate Route Frequency Ordered Stop   10/09/13 2300  ceFAZolin (ANCEF) IVPB 1 g/50 mL premix     1 g 100 mL/hr over 30 Minutes Intravenous Every 8 hours 10/09/13 2139 10/10/13 0636   10/09/13 1615  cefTRIAXone (ROCEPHIN) 1 g in dextrose 5 % 50 mL IVPB     1 g 100 mL/hr over 30 Minutes Intravenous To Surgery 10/09/13 1605 10/10/13 1615   10/08/13 1339  cefTRIAXone (ROCEPHIN) 2 g in dextrose 5 %  50 mL IVPB     2 g 100 mL/hr over 30 Minutes Intravenous 30 min pre-op 10/08/13 1339 10/09/13 1623       Patient was given sequential compression devices and early ambulation to prevent DVT.   Patient benefited maximally from hospital stay and there were no complications. At the time of discharge, the patient was urinating/moving their bowels without difficulty, tolerating a regular diet, pain is controlled with oral pain medications and they have been cleared by PT/OT.   Recent vital signs: No data found.    Recent laboratory studies: No results found for this basename: WBC, HGB, HCT, PLT, NA, K, CL, CO2, BUN, CREATININE, GLUCOSE, PT, INR, CALCIUM, 2,  in the last 72 hours   Discharge Medications:     Medication List    STOP taking these medications       diclofenac 75 MG EC tablet  Commonly known as:  VOLTAREN     ondansetron 4 MG tablet  Commonly known as:  ZOFRAN     PRESCRIPTION MEDICATION     promethazine 25 MG tablet  Commonly known as:  PHENERGAN      TAKE these medications       albuterol 108 (90 BASE) MCG/ACT inhaler  Commonly known as:  PROVENTIL HFA;VENTOLIN HFA  Inhale 2 puffs into the lungs every 6 (six) hours as needed for wheezing or shortness of breath.     azelastine 0.1 % nasal spray  Commonly known as:  ASTELIN  Place 2 sprays into both nostrils daily. Use in each nostril as directed     butalbital-acetaminophen-caffeine 50-325-40 MG per tablet  Commonly known as:  FIORICET, ESGIC  Take 1 tablet by mouth daily as needed for headache.     DSS 100 MG Caps  Take 100 mg by mouth 2 (two) times daily.     docusate sodium 100 MG capsule  Commonly known as:  COLACE  Take 1 capsule (100 mg total) by mouth 2 (two) times daily.     fexofenadine 180 MG tablet  Commonly known as:  ALLEGRA  Take 180 mg by mouth daily.     fluticasone 50 MCG/ACT nasal spray  Commonly known as:  FLONASE  Place 1 spray into both nostrils daily as needed for rhinitis.      gabapentin 300 MG capsule  Commonly known as:  NEURONTIN  Take 900 mg by mouth at bedtime.     methocarbamol 500 MG tablet  Commonly known as:  ROBAXIN  Take 1 tablet (500 mg total) by mouth daily.     omeprazole 40 MG capsule  Commonly known as:  PRILOSEC  Take 40 mg by mouth 2 (two) times daily.     ondansetron 4 MG disintegrating tablet  Commonly known as:  ZOFRAN ODT  Take 1 tablet (4 mg total) by mouth every 8 (eight) hours as needed.     oxyCODONE-acetaminophen 10-325 MG per tablet  Commonly known as:  PERCOCET  Take 1 tablet by mouth every 4 (four) hours as needed for pain.     PARoxetine 20 MG tablet  Commonly known as:  PAXIL  Take 20 mg by mouth at bedtime.     polyethylene glycol packet  Commonly known as:  MIRALAX / GLYCOLAX  Take 17 g by mouth daily.     PRESCRIPTION MEDICATION  Take 10 mg by mouth 3 (three) times daily. MED NAME: Domperidone.     propranolol-hydrochlorothiazide 40-25 MG per tablet  Commonly known as:  INDERIDE  Take 1 tablet by mouth 2 (two) times daily.     simvastatin 40 MG tablet  Commonly known as:  ZOCOR  Take 40 mg by mouth daily.     TOVIAZ 4 MG Tb24 tablet  Generic drug:  fesoterodine  Take 4 mg by mouth daily.        Diagnostic Studies: Dg Chest 2 View  10/03/2013   CLINICAL DATA:  Hypertension.  Preoperative for back surgery.  EXAM: CHEST  2 VIEW  COMPARISON:  None.  FINDINGS: The heart size and mediastinal contours are stable. The aorta is tortuous There is no focal infiltrate, pulmonary edema, or pleural effusion. The visualized skeletal structures are stable.  IMPRESSION: No active cardiopulmonary disease.   Electronically Signed   By: Abelardo Diesel M.D.   On: 10/03/2013 14:13   Dg Lumbar Spine 2-3 Views  10/10/2013   CLINICAL DATA:  Status post lumbar fusion.  EXAM: LUMBAR SPINE - 2-3 VIEW  COMPARISON:  Interoperative fluoroscopic spot views lumbar spine 10/09/2013. Two views lumbar spine 10/03/2013.  FINDINGS: The  patient is status post L5-S1 fusion with pedicle screws, stabilization bars and biomechanical intervertebral disc device in place. Hardware is intact. Vertebral body height and alignment are maintained. Multilevel loss of disc space height and endplate spurring are seen.  IMPRESSION: Status post L5-S1 discectomy and fusion without evidence of complication. No acute abnormality.   Electronically Signed   By: Inge Rise M.D.   On: 10/10/2013 08:11   Dg  Lumbar Spine 2-3 Views  10/09/2013   CLINICAL DATA:  Pedicle screw placement  EXAM: DG C-ARM GT 120 MIN; LUMBAR SPINE - 2-3 VIEW  COMPARISON:  Lumbar spine October 03, 2013  FINDINGS: Frontal and lateral views show pedicle screws placed at L5 and S1 bilaterally. Screws are seen extending into the respective vertebral bodies bilaterally. There is also a disc spacer at L5-S1. There is osteoarthritic change in the lower lumbar spine.  IMPRESSION: Postoperative change with disc spacer at L5-S1 and pedicle screws at L5 and S1 bilaterally. Screws and plates appear intact.   Electronically Signed   By: Lowella Grip M.D.   On: 10/09/2013 16:39   Dg Lumbar Spine 2-3 Views  10/03/2013   CLINICAL DATA:  Pre operative exam.  For lumbar fusion.  EXAM: LUMBAR SPINE - 2-3 VIEW  COMPARISON:  CT myelogram dated 02/27/2012 and intraoperative radiograph dated 07/27/2011  FINDINGS: There is degenerative disc disease from L2-3 through L5-S1. There are prominent posterior osteophytes from the endplates at G3-8 and V5-I4 with small osteophytes at L2-3 and L3-4 posteriorly. The patient has had previous posterior decompression at L4. There is a slight rotoscoliosis of the lumbar spine.  IMPRESSION: Multilevel degenerative disc disease.   Electronically Signed   By: Rozetta Nunnery M.D.   On: 10/03/2013 14:23   Dg C-arm Gt 120 Min  10/09/2013   CLINICAL DATA:  Pedicle screw placement  EXAM: DG C-ARM GT 120 MIN; LUMBAR SPINE - 2-3 VIEW  COMPARISON:  Lumbar spine October 03, 2013   FINDINGS: Frontal and lateral views show pedicle screws placed at L5 and S1 bilaterally. Screws are seen extending into the respective vertebral bodies bilaterally. There is also a disc spacer at L5-S1. There is osteoarthritic change in the lower lumbar spine.  IMPRESSION: Postoperative change with disc spacer at L5-S1 and pedicle screws at L5 and S1 bilaterally. Screws and plates appear intact.   Electronically Signed   By: Lowella Grip M.D.   On: 10/09/2013 16:39          Follow-up Information   Follow up with [facility]. (Someone from [facility] will contact you concerning start date and time for physical  therapy.)    Contact information:   [address] [place] [address] [phone]       Discharge Plan:  discharge to home  Disposition:     Signed: Lanae Crumbly for Dr. Melina Schools [facility] [phone] 10/17/2013, 3:22 PM

## 2013-10-19 ENCOUNTER — Other Ambulatory Visit: Payer: Self-pay | Admitting: Internal Medicine

## 2013-11-19 ENCOUNTER — Ambulatory Visit: Payer: 59 | Admitting: Family Medicine

## 2013-11-22 ENCOUNTER — Encounter: Payer: Self-pay | Admitting: Family Medicine

## 2013-11-22 ENCOUNTER — Ambulatory Visit (INDEPENDENT_AMBULATORY_CARE_PROVIDER_SITE_OTHER): Payer: 59 | Admitting: Family Medicine

## 2013-11-22 VITALS — BP 136/88 | Temp 98.1°F | Wt 210.0 lb

## 2013-11-22 DIAGNOSIS — E785 Hyperlipidemia, unspecified: Secondary | ICD-10-CM

## 2013-11-22 DIAGNOSIS — J45909 Unspecified asthma, uncomplicated: Secondary | ICD-10-CM

## 2013-11-22 DIAGNOSIS — I10 Essential (primary) hypertension: Secondary | ICD-10-CM

## 2013-11-22 DIAGNOSIS — J309 Allergic rhinitis, unspecified: Secondary | ICD-10-CM | POA: Insufficient documentation

## 2013-11-22 NOTE — Assessment & Plan Note (Signed)
Well controlled with once weekly albuterol. Continue without controller.

## 2013-11-22 NOTE — Progress Notes (Signed)
Mark Reddish, MD Phone: [phone]  Subjective:  Patient presents today to establish care with me as their new primary care provider. Patient was formerly a patient of Dr. Leanne Chang. Chief complaint-noted.   Hypertension-well controlled  BP Readings from Last 3 Encounters:  11/22/13 136/88  10/12/13 137/77  10/12/13 137/77   Home BP monitoring-no Compliant with medications-yes without side effects ROS-Denies any CP, HA, SOB, blurry vision, LE edema  Hyperlipidemia-controlled  Lab Results  Component Value Date   LDLCALC 57 03/19/2013   On statin: simvastatin Regular exercise: active at work, otherwise restricted due to back surgery ROS- no chest pain or shortness of breath. No myalgias  Asthma-controlled -Takes albuterol about once a week typically. No controller medicine ROS- no current shortness of breath or wheeze  The following were reviewed and entered/updated in epic: Past Medical History  Diagnosis Date  . HYPERTENSION 03/07/2007  . Headache(784.0) 03/07/2007  . PROSTATE CANCER, HX OF 03/07/2007  . ASTHMA 03/12/2007  . HYPERLIPIDEMIA 06/11/2007  . GERD 04/02/2009  . HIATAL HERNIA 04/28/2009  . TRANSAMINASES, SERUM, ELEVATED 04/28/2009  . Gastroparesis 05/01/2009  . Anxiety state, unspecified 09/01/2009  . Hiatal hernia   . Ulcer     as teenager  . Tubular adenoma of colon   . Polyp of colon, hyperplastic   . Pneumonia     hx of  . Cancer     prostate   Patient Active Problem List   Diagnosis Date Noted  . Back pain 10/09/2013    Priority: Medium  . Anxiety state, unspecified 09/01/2009    Priority: Medium  . HYPERLIPIDEMIA 06/11/2007    Priority: Medium  . ASTHMA 03/12/2007    Priority: Medium  . HYPERTENSION 03/07/2007    Priority: Medium  . Migraine headache 03/07/2007    Priority: Medium  . PROSTATE CANCER, HX OF 03/07/2007    Priority: Medium  . Allergic rhinitis 11/22/2013    Priority: Low  . Overweight(278.02) 01/10/2012    Priority: Low  .  Gastroparesis 05/01/2009    Priority: Low  . HIATAL HERNIA 04/28/2009    Priority: Low  . GERD 04/02/2009    Priority: Low   Past Surgical History  Procedure Laterality Date  . Prostatectomy  07/10/06  . Appendectomy    . Rotator cuff repair      9/09 on right  . Back surgery  05/2011  . Colonoscopy    . Lumbar laminectomy/decompression microdiscectomy N/A 10/09/2013    Procedure: L4-5 DECOMPRESSION/FARAMINOTOMY REVISION ;  Surgeon: Melina Schools, MD;  Location: [facility];  Service: Orthopedics;  Laterality: N/A;  . Transforaminal lumbar interbody fusion (tlif) with pedicle screw fixation 1 level N/A 10/09/2013    Procedure: TLIF L5-S1;  Surgeon: Melina Schools, MD;  Location: [facility];  Service: Orthopedics;  Laterality: N/A;    Family History  Problem Relation Age of Onset  . Pancreatic cancer Mother 22  . Hypertension Father   . Hypertension Brother     x 2  . Colon cancer Neg Hx   . Rectal cancer Neg Hx   . Stomach cancer Neg Hx     Medications- reviewed and updated Current Outpatient Prescriptions  Medication Sig Dispense Refill  . butalbital-acetaminophen-caffeine (FIORICET, ESGIC) 50-325-40 MG per tablet TAKE 1 TABLET BY MOUTH EVERY DAY AS NEEDED  30 tablet  5  . docusate sodium (COLACE) 100 MG capsule Take 1 capsule (100 mg total) by mouth 2 (two) times daily.  50 capsule  0  . fesoterodine (TOVIAZ) 4 MG  TB24 tablet Take 4 mg by mouth daily.      . fexofenadine (ALLEGRA) 180 MG tablet Take 180 mg by mouth daily.      [name] Kitchen gabapentin (NEURONTIN) 300 MG capsule Take 900 mg by mouth at bedtime.      . methocarbamol (ROBAXIN) 500 MG tablet Take 1 tablet (500 mg total) by mouth daily.  60 tablet  0  . omeprazole (PRILOSEC) 40 MG capsule Take 40 mg by mouth 2 (two) times daily.      . ondansetron (ZOFRAN ODT) 4 MG disintegrating tablet Take 1 tablet (4 mg total) by mouth every 8 (eight) hours as needed.  40 tablet  0  . oxyCODONE-acetaminophen (PERCOCET) 10-325 MG per tablet Take 1  tablet by mouth every 4 (four) hours as needed for pain.  90 tablet  0  . PARoxetine (PAXIL) 20 MG tablet Take 20 mg by mouth at bedtime.      [name] Kitchen PRESCRIPTION MEDICATION Take 10 mg by mouth 3 (three) times daily. MED NAME: Domperidone.      . propranolol-hydrochlorothiazide (INDERIDE) 40-25 MG per tablet Take 1 tablet by mouth 2 (two) times daily.      . simvastatin (ZOCOR) 40 MG tablet Take 40 mg by mouth daily.      [name] Kitchen albuterol (PROVENTIL HFA;VENTOLIN HFA) 108 (90 BASE) MCG/ACT inhaler Inhale 2 puffs into the lungs every 6 (six) hours as needed for wheezing or shortness of breath.      [name] Kitchen azelastine (ASTELIN) 0.1 % nasal spray Place 2 sprays into both nostrils daily. Use in each nostril as directed      . fluticasone (FLONASE) 50 MCG/ACT nasal spray Place 1 spray into both nostrils daily as needed for rhinitis.      . polyethylene glycol (MIRALAX / GLYCOLAX) packet Take 17 g by mouth daily.  30 each  0   No current facility-administered medications for this visit.    Allergies-reviewed and updated Allergies  Allergen Reactions  . Propoxyphene Hcl Nausea And Vomiting         History   Social History  . Marital Status: Married    Spouse Name: N/A    Number of Children: 4  . Years of Education: N/A   Occupational History  .  [employer]   Social History Main Topics  . Smoking status: Former Smoker -- 0.50 packs/day for 2 years    Types: Cigarettes    Quit date: 04/09/1983  . Smokeless tobacco: Never Used  . Alcohol Use: No  . Drug Use: No  . Sexual Activity: Yes   Other Topics Concern  . None   Social History Narrative   Married 1974. Wife-Helen. 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       Working at [employer]: hunting, boat (off for 1 year in 2015)    ROS--See HPI   Objective: BP 136/88  Temp(Src) 98.1  F (36.7 C)  Wt 210 lb (95.255 kg) Gen: NAD, resting comfortably, balding, appears stated age 63: Mucous membranes are moist. CV: RRR no murmurs rubs or gallops Lungs: CTAB no crackles, wheeze, rhonchi Abdomen: soft/nontender/nondistended/normal bowel sounds.  Ext: no edema Skin: warm, dry, no rash Neuro: grossly normal, moves all extremities, PERRLA  Assessment/Plan:  HYPERTENSION Controlled. Continue propranolol-hctz.   HYPERLIPIDEMIA Well controlled with  ldl 57, continue simvastatin. Recheck at physical in 6 months.   ASTHMA Well controlled with once weekly albuterol. Continue without controller.

## 2013-11-22 NOTE — Assessment & Plan Note (Signed)
Controlled. Continue propranolol-hctz.

## 2013-11-22 NOTE — Assessment & Plan Note (Signed)
Well controlled with ldl 57, continue simvastatin. Recheck at physical in 6 months.

## 2013-11-22 NOTE — Patient Instructions (Signed)
Flu shot today  Follow up in 6 months for a physical or sooner if anything comes up, after that we will plan on yearly  BP looks fine today

## 2013-11-25 ENCOUNTER — Encounter: Payer: Self-pay | Admitting: Internal Medicine

## 2013-11-26 ENCOUNTER — Telehealth: Payer: Self-pay | Admitting: Family Medicine

## 2013-11-26 NOTE — Telephone Encounter (Signed)
Pharm called bc propranolol-hydrochlorothiazide (INDERIDE) 40-25 MG per tablet is on backorder from the manufacturer. Is it ok to split into 2 pills.  The pharm has checked w/ pt and he is ok w/ that. pls advise

## 2013-11-26 NOTE — Telephone Encounter (Signed)
Is this ok Dr. [name]? 

## 2013-11-26 NOTE — Telephone Encounter (Signed)
Yes that's fine 

## 2013-11-26 NOTE — Telephone Encounter (Signed)
This is fine Mrs. Mark [name]

## 2013-12-02 MED ORDER — PROPRANOLOL-HCTZ 40-25 MG PO TABS: 1.0000 | ORAL_TABLET | Freq: Two times a day (BID) | ORAL | Status: DC

## 2013-12-02 NOTE — Telephone Encounter (Signed)
[facility] [place] sent a RX request for the below mention medication.  They are requesting 90 fill as well.

## 2013-12-02 NOTE — Telephone Encounter (Signed)
Medication sent in. 

## 2013-12-13 ENCOUNTER — Ambulatory Visit (HOSPITAL_COMMUNITY): Payer: 59 | Attending: Surgery | Admitting: Physical Therapy

## 2014-01-20 ENCOUNTER — Other Ambulatory Visit: Payer: Self-pay | Admitting: Internal Medicine

## 2014-02-11 ENCOUNTER — Ambulatory Visit (INDEPENDENT_AMBULATORY_CARE_PROVIDER_SITE_OTHER): Payer: 59 | Admitting: Internal Medicine

## 2014-02-11 ENCOUNTER — Encounter: Payer: Self-pay | Admitting: Internal Medicine

## 2014-02-11 VITALS — BP 142/84 | HR 72 | Ht 71.0 in | Wt 209.4 lb

## 2014-02-11 DIAGNOSIS — K3184 Gastroparesis: Secondary | ICD-10-CM

## 2014-02-11 DIAGNOSIS — K219 Gastro-esophageal reflux disease without esophagitis: Secondary | ICD-10-CM

## 2014-02-11 MED ORDER — PROMETHAZINE HCL 25 MG PO TABS: 25.0000 mg | ORAL_TABLET | Freq: Four times a day (QID) | ORAL | Status: DC | PRN

## 2014-02-11 MED ORDER — DIAZEPAM 5 MG PO TABS: 5.0000 mg | ORAL_TABLET | Freq: Three times a day (TID) | ORAL | Status: DC | PRN

## 2014-02-11 MED ORDER — ONDANSETRON HCL 4 MG PO TABS: 4.0000 mg | ORAL_TABLET | Freq: Three times a day (TID) | ORAL | Status: DC | PRN

## 2014-02-11 NOTE — Patient Instructions (Addendum)
We have sent the following medications to your pharmacy for you to pick up at your convenience: zofran Promethazine valium  CC: Dr Rushie Chestnut

## 2014-02-11 NOTE — Progress Notes (Signed)
Mark [name] 06-04-1950 [id number]  Note: This dictation was prepared with Dragon digital system. Any transcriptional errors that result from this procedure are unintentional.   History of Present Illness:  This is a 63 year old African-American male with a history of gastroesophageal reflux, gastroparesis, chronic nausea relieved by Zofran, Prilosec and Phenergan. He was on domperidone for a long time but this was discontinued when the medication was no longer available. He has been on disability for his back after he underwent back surgery in August 2015. He has no specific GI issues today. He needs a refill on his medications. His last upper endoscopy in February 2011 showed gastritis and esophagitis. Last colonoscopy in May 2014 showed a tubular adenoma. His next colonoscopy is scheduled for May 2019. A gastric emptying scan in February 2011 resulted in a 36% retention in 2 hours. His HIDA scan was normal at 65% ejection fraction.    Past Medical History  Diagnosis Date  . HYPERTENSION 03/07/2007  . Headache(784.0) 03/07/2007  . PROSTATE CANCER, HX OF 03/07/2007  . ASTHMA 03/12/2007  . HYPERLIPIDEMIA 06/11/2007  . GERD 04/02/2009  . HIATAL HERNIA 04/28/2009  . TRANSAMINASES, SERUM, ELEVATED 04/28/2009  . Gastroparesis 05/01/2009  . Anxiety state, unspecified 09/01/2009  . Hiatal hernia   . Ulcer     as teenager  . Tubular adenoma of colon   . Polyp of colon, hyperplastic   . Pneumonia     hx of  . Cancer     prostate    Past Surgical History  Procedure Laterality Date  . Prostatectomy  07/10/06  . Appendectomy    . Rotator cuff repair      9/09 on right  . Back surgery  05/2011  . Colonoscopy    . Lumbar laminectomy/decompression microdiscectomy N/A 10/09/2013    Procedure: L4-5 DECOMPRESSION/FARAMINOTOMY REVISION ;  Surgeon: Melina Schools, MD;  Location: [facility];  Service: Orthopedics;  Laterality: N/A;  . Transforaminal lumbar interbody fusion (tlif) with pedicle screw fixation 1  level N/A 10/09/2013    Procedure: TLIF L5-S1;  Surgeon: Melina Schools, MD;  Location: [facility];  Service: Orthopedics;  Laterality: N/A;    Allergies  Allergen Reactions  . Propoxyphene Hcl Nausea And Vomiting         Family history and social history have been reviewed.  Review of Systems: Denies dysphagia heartburn as long as he takes his medications he denies constipation  The remainder of the 10 point ROS is negative except as outlined in the H&P  Physical Exam: General Appearance Well developed, in no distress Eyes  Non icteric  HEENT  Non traumatic, normocephalic  Mouth No lesion, tongue papillated, no cheilosis Neck Supple without adenopathy, thyroid not enlarged, no carotid bruits, no JVD Lungs Clear to auscultation bilaterally COR Normal S1, normal S2, regular rhythm, no murmur, quiet precordium Abdomen soft  minimally tender in subxiphoid area. Normal active bowel sounds. No distention. Liver edge at costal margin Rectal normal rectal sphincter tone. Soft Hemoccult negative stool Extremities  No pedal edema Skin No lesions Neurological Alert and oriented x 3 Psychological Normal mood and affect  Assessment and Plan:   Problem #38 63 year old African-American male with gastroesophageal reflux and mild gastroparesis. He is currently doing well. We will refill Phenergan and Zofran and give Valium 5 mg, dispense 30 when necessary for anxiety. I will see him in 1 year. Problem #2 colorectal screening. He is up-to-date on colonoscopy which was done in May 2014    Delfin Edis 02/11/2014

## 2014-03-15 ENCOUNTER — Other Ambulatory Visit: Payer: Self-pay | Admitting: Family Medicine

## 2014-03-23 ENCOUNTER — Other Ambulatory Visit: Payer: Self-pay | Admitting: Internal Medicine

## 2014-04-24 ENCOUNTER — Other Ambulatory Visit: Payer: Self-pay | Admitting: Family Medicine

## 2014-04-24 ENCOUNTER — Other Ambulatory Visit: Payer: Self-pay | Admitting: Internal Medicine

## 2014-04-24 ENCOUNTER — Other Ambulatory Visit (INDEPENDENT_AMBULATORY_CARE_PROVIDER_SITE_OTHER): Payer: 59

## 2014-04-24 DIAGNOSIS — Z0000 Encounter for general adult medical examination without abnormal findings: Secondary | ICD-10-CM

## 2014-04-24 LAB — HEPATIC FUNCTION PANEL
ALBUMIN: 4.1 g/dL (ref 3.5–5.2)
ALT: 27 U/L (ref 0–53)
AST: 22 U/L (ref 0–37)
Alkaline Phosphatase: 104 U/L (ref 39–117)
Bilirubin, Direct: 0.1 mg/dL (ref 0.0–0.3)
TOTAL PROTEIN: 6.5 g/dL (ref 6.0–8.3)
Total Bilirubin: 0.5 mg/dL (ref 0.2–1.2)

## 2014-04-24 LAB — POCT URINALYSIS DIPSTICK
BILIRUBIN UA: NEGATIVE
Blood, UA: NEGATIVE
Glucose, UA: NEGATIVE
KETONES UA: NEGATIVE
LEUKOCYTES UA: NEGATIVE
Nitrite, UA: NEGATIVE
Protein, UA: NEGATIVE
Spec Grav, UA: 1.015
Urobilinogen, UA: 2
pH, UA: 6

## 2014-04-24 LAB — LIPID PANEL
Cholesterol: 147 mg/dL (ref 0–200)
HDL: 39.5 mg/dL (ref >39.00)
LDL CALC: 78 mg/dL (ref 0–99)
NonHDL: 107.5
TRIGLYCERIDES: 149 mg/dL (ref 0.0–149.0)
Total CHOL/HDL Ratio: 4
VLDL: 29.8 mg/dL (ref 0.0–40.0)

## 2014-04-24 LAB — BASIC METABOLIC PANEL
BUN: 15 mg/dL (ref 6–23)
CHLORIDE: 101 meq/L (ref 96–112)
CO2: 34 mEq/L — ABNORMAL HIGH (ref 19–32)
CREATININE: 0.85 mg/dL (ref 0.40–1.50)
Calcium: 9.5 mg/dL (ref 8.4–10.5)
GFR: 117.23 mL/min (ref >60.00)
Glucose, Bld: 96 mg/dL (ref 70–99)
Potassium: 4.1 mEq/L (ref 3.5–5.1)
Sodium: 141 mEq/L (ref 135–145)

## 2014-04-24 LAB — CBC WITH DIFFERENTIAL/PLATELET
BASOS PCT: 0.9 % (ref 0.0–3.0)
Basophils Absolute: 0 10*3/uL (ref 0.0–0.1)
Eosinophils Absolute: 0.1 10*3/uL (ref 0.0–0.7)
Eosinophils Relative: 2.2 % (ref 0.0–5.0)
HEMATOCRIT: 46.9 % (ref 39.0–52.0)
Hemoglobin: 16 g/dL (ref 13.0–17.0)
LYMPHS ABS: 1.2 10*3/uL (ref 0.7–4.0)
Lymphocytes Relative: 35.4 % (ref 12.0–46.0)
MCHC: 34.1 g/dL (ref 30.0–36.0)
MCV: 92.6 fl (ref 78.0–100.0)
MONO ABS: 0.6 10*3/uL (ref 0.1–1.0)
MONOS PCT: 16.7 % — AB (ref 3.0–12.0)
Neutro Abs: 1.5 10*3/uL (ref 1.4–7.7)
Neutrophils Relative %: 44.8 % (ref 43.0–77.0)
Platelets: 276 10*3/uL (ref 150.0–400.0)
RBC: 5.07 Mil/uL (ref 4.22–5.81)
RDW: 12.7 % (ref 11.5–15.5)
WBC: 3.5 10*3/uL — AB (ref 4.0–10.5)

## 2014-04-24 LAB — TSH: TSH: 0.52 u[IU]/mL (ref 0.35–4.50)

## 2014-04-24 LAB — PSA: PSA: 0.01 ng/mL — AB (ref 0.10–4.00)

## 2014-04-28 ENCOUNTER — Telehealth: Payer: Self-pay | Admitting: Internal Medicine

## 2014-04-28 NOTE — Telephone Encounter (Signed)
Talked to patient this afternoon. He would like to get a Rx refill for Paxil, 20 mg, #90, with one refill. He has not had this refilled since 07/2013. He will be going to his PCP doctor, Dr. Yong Channel, on May 01, 2014. Can he get a refill for this Rx? Please advise.

## 2014-04-29 MED ORDER — PAROXETINE HCL 20 MG PO TABS: 20.0000 mg | ORAL_TABLET | Freq: Every day | ORAL | Status: DC

## 2014-04-29 NOTE — Telephone Encounter (Signed)
Ok to refill per Dr. Olevia Perches.

## 2014-04-29 NOTE — Telephone Encounter (Signed)
Sent Rx for Paxil, 20 mg, #90, one refill to [facility] in [place], [place]. Called patient to let them know the Rx was sent and approved through Dr. Olevia Perches.

## 2014-05-01 ENCOUNTER — Ambulatory Visit (INDEPENDENT_AMBULATORY_CARE_PROVIDER_SITE_OTHER): Payer: 59 | Admitting: Family Medicine

## 2014-05-01 ENCOUNTER — Encounter: Payer: Self-pay | Admitting: Family Medicine

## 2014-05-01 VITALS — BP 102/72 | Temp 97.8°F | Ht 68.75 in | Wt 191.0 lb

## 2014-05-01 DIAGNOSIS — Z0000 Encounter for general adult medical examination without abnormal findings: Secondary | ICD-10-CM | POA: Diagnosis not present

## 2014-05-01 DIAGNOSIS — D72819 Decreased white blood cell count, unspecified: Secondary | ICD-10-CM | POA: Diagnosis not present

## 2014-05-01 LAB — CBC WITH DIFFERENTIAL/PLATELET
BASOS ABS: 0 10*3/uL (ref 0.0–0.1)
BASOS PCT: 0.7 % (ref 0.0–3.0)
Eosinophils Absolute: 0.1 10*3/uL (ref 0.0–0.7)
Eosinophils Relative: 1.9 % (ref 0.0–5.0)
HCT: 47.6 % (ref 39.0–52.0)
Hemoglobin: 16.5 g/dL (ref 13.0–17.0)
LYMPHS ABS: 1.5 10*3/uL (ref 0.7–4.0)
Lymphocytes Relative: 32 % (ref 12.0–46.0)
MCHC: 34.6 g/dL (ref 30.0–36.0)
MCV: 91.3 fl (ref 78.0–100.0)
Monocytes Absolute: 0.6 10*3/uL (ref 0.1–1.0)
Monocytes Relative: 13.7 % — ABNORMAL HIGH (ref 3.0–12.0)
NEUTROS ABS: 2.4 10*3/uL (ref 1.4–7.7)
Neutrophils Relative %: 51.7 % (ref 43.0–77.0)
PLATELETS: 281 10*3/uL (ref 150.0–400.0)
RBC: 5.22 Mil/uL (ref 4.22–5.81)
RDW: 12.6 % (ref 11.5–15.5)
WBC: 4.7 10*3/uL (ref 4.0–10.5)

## 2014-05-01 NOTE — Patient Instructions (Addendum)
Recommend HIV screening  Recommend flu vaccine  Check in 6 weeks to see how blood pressure and weight are doing.   It's not that weight loss is bad, just want to make sure it is from trying and not from not trying.   I would encourage you to walk at least 5 minutes a day and build your way up to 15 minutes since you said its ok with back doctor.

## 2014-05-01 NOTE — Progress Notes (Signed)
Mark Reddish, MD Phone: [phone]  Subjective:  Patient presents today for their annual physical. Chief complaint-noted.   His exercise has been limited by prior back surgery.  His weight is down almost 20 lbs. States he wonders if it was medications he was taking. He has been out of work and states he was eating out a lot more which could have contributed. Patient does admit that he had 5 teeth pulled in December and has not been eating as well as result  Back surgery aug 12. Is applying for social security disability. Will start getting social security benefits soon. Left [employer]. Has some numbness in left leg. Per Dr. Rolena Infante can only pick up 15 pounds and stay 0 degrees bending, stooping, no overhead work. No light duty available at [employer].   ROS- full  review of systems was completed and negative except for: some sinus congestion and pressure last week. Occasional shortness of breath and wheeze relieved by asthma. No chest pain. No SI/HI. No rectal pain or bone pain.   The following were reviewed and entered/updated in epic: Past Medical History  Diagnosis Date  . HYPERTENSION 03/07/2007  . Headache(784.0) 03/07/2007  . PROSTATE CANCER, HX OF 03/07/2007  . ASTHMA 03/12/2007  . HYPERLIPIDEMIA 06/11/2007  . GERD 04/02/2009  . HIATAL HERNIA 04/28/2009  . TRANSAMINASES, SERUM, ELEVATED 04/28/2009  . Gastroparesis 05/01/2009  . Anxiety state, unspecified 09/01/2009  . Hiatal hernia   . Ulcer     as teenager  . Tubular adenoma of colon   . Polyp of colon, hyperplastic   . Pneumonia     hx of  . Cancer     prostate   Patient Active Problem List   Diagnosis Date Noted  . Back pain 10/09/2013    Priority: Medium  . Anxiety state, unspecified 09/01/2009    Priority: Medium  . HYPERLIPIDEMIA 06/11/2007    Priority: Medium  . Asthma 03/12/2007    Priority: Medium  . HYPERTENSION 03/07/2007    Priority: Medium  . Migraine headache 03/07/2007    Priority: Medium  . PROSTATE  CANCER, HX OF 03/07/2007    Priority: Medium  . Allergic rhinitis 11/22/2013    Priority: Low  . Overweight(278.02) 01/10/2012    Priority: Low  . Gastroparesis 05/01/2009    Priority: Low  . HIATAL HERNIA 04/28/2009    Priority: Low  . GERD 04/02/2009    Priority: Low   Past Surgical History  Procedure Laterality Date  . Prostatectomy  07/10/06  . Appendectomy    . Rotator cuff repair      9/09 on right  . Back surgery  05/2011  . Colonoscopy    . Lumbar laminectomy/decompression microdiscectomy N/A 10/09/2013    Procedure: L4-5 DECOMPRESSION/FARAMINOTOMY REVISION ;  Surgeon: Melina Schools, MD;  Location: [facility];  Service: Orthopedics;  Laterality: N/A;  . Transforaminal lumbar interbody fusion (tlif) with pedicle screw fixation 1 level N/A 10/09/2013    Procedure: TLIF L5-S1;  Surgeon: Melina Schools, MD;  Location: [facility];  Service: Orthopedics;  Laterality: N/A;    Family History  Problem Relation Age of Onset  . Pancreatic cancer Mother 84  . Hypertension Father   . Hypertension Brother     x 2  . Colon cancer Neg Hx   . Rectal cancer Neg Hx   . Stomach cancer Neg Hx     Medications- reviewed and updated Current Outpatient Prescriptions  Medication Sig Dispense Refill  . albuterol (PROVENTIL HFA;VENTOLIN HFA) 108 (  90 BASE) MCG/ACT inhaler Inhale 2 puffs into the lungs every 6 (six) hours as needed for wheezing or shortness of breath.    [name] Kitchen azelastine (ASTELIN) 0.1 % nasal spray Place 2 sprays into both nostrils daily. Use in each nostril as directed    . butalbital-acetaminophen-caffeine (FIORICET, ESGIC) 50-325-40 MG per tablet TAKE 1 TABLET BY MOUTH EVERY DAY AS NEEDED 30 tablet 5  . diazepam (VALIUM) 5 MG tablet Take 1 tablet (5 mg total) by mouth every 8 (eight) hours as needed for anxiety. 30 tablet 1  . fesoterodine (TOVIAZ) 4 MG TB24 tablet Take 4 mg by mouth daily.    . fexofenadine (ALLEGRA) 180 MG tablet Take 180 mg by mouth daily.    . fluticasone (FLONASE) 50  MCG/ACT nasal spray USE ONE SPRAY AS DIRECTED ONCE DAILY 16 g 1  . gabapentin (NEURONTIN) 300 MG capsule Take 900 mg by mouth at bedtime.    [name] Kitchen omeprazole (PRILOSEC) 40 MG capsule Take 40 mg by mouth 2 (two) times daily.    . ondansetron (ZOFRAN) 4 MG tablet Take 1 tablet (4 mg total) by mouth every 8 (eight) hours as needed. 90 tablet 1  . PARoxetine (PAXIL) 20 MG tablet Take 1 tablet (20 mg total) by mouth at bedtime. 90 tablet 1  . PRESCRIPTION MEDICATION Take 10 mg by mouth 3 (three) times daily. MED NAME: Domperidone.    [name] Kitchen PROAIR HFA 108 (90 BASE) MCG/ACT inhaler INHALE 2 PUFFS 4 TIMES A DAY AS DIRECTED (NEEDS OFFICE VISIT) 8.5 each 1  . promethazine (PHENERGAN) 25 MG tablet Take 1 tablet (25 mg total) by mouth every 6 (six) hours as needed for nausea or vomiting. 90 tablet 1  . propranolol-hydrochlorothiazide (INDERIDE) 40-25 MG per tablet Take 1 tablet by mouth 2 (two) times daily. 90 tablet 0  . simvastatin (ZOCOR) 40 MG tablet TAKE ONE TABLET AT BEDTIME 90 tablet 2   Allergies-reviewed and updated Allergies  Allergen Reactions  . Propoxyphene Hcl Nausea And Vomiting         History   Social History  . Marital Status: Married    Spouse Name: N/A  . Number of Children: 4  . Years of Education: N/A   Occupational History  . Disabled    Social History Main Topics  . Smoking status: Former Smoker -- 0.50 packs/day for 2 years    Types: Cigarettes    Quit date: 04/09/1983  . Smokeless tobacco: Never Used  . Alcohol Use: No  . Drug Use: No  . Sexual Activity: Yes   Other Topics Concern  . Not on file   Social History Narrative   Married 1974. Wife-Helen. 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       Working at [employer]: hunting, boat (off for 1 year in 2015)   ROS--See HPI   Objective: BP 102/72 mmHg   Temp(Src) 97.8 F (36.6 C) (Oral)  Ht 5' 8.75" (1.746 m)  Wt 191 lb (86.637 kg)  BMI 28.42 kg/m2 Gen: NAD, resting comfortably HEENT: Mucous membranes are moist. Oropharynx normal. Several missing teeth (plan for replacement) Neck: no thyromegaly, no lymphadenopathy CV: RRR no murmurs rubs or gallops Lungs: CTAB no crackles, wheeze, rhonchi Abdomen: soft/nontender/nondistended/normal bowel sounds. No rebound or guarding.  Ext: no edema, 2+ PT pulses  Skin: warm, dry, no rash Neuro: grossly normal, moves all extremities, PERRLA  Assessment/Plan:  64 y.o. male presenting for annual physical.  Health Maintenance counseling: 1. Anticipatory guidance: Patient counseled regarding regular dental exams, wearing seatbelts, wearing sunscreen 2. Risk factor reduction:  Advised patient of need for regular exercise and diet rich and fruits and vegetables to reduce risk of heart attack and stroke.  3. Immunizations/screenings/ancillary studies- up to date but HIV  4. Leukopenia noted on labs- we will recheck CBC and add HIV 5. 6 week follow up for weight and BP recheck (much lower than normal today and has mild fatigue)  Can refill all meds on list today if needed- Orders Placed This Encounter  Procedures  . HIV antibody (with reflex)  . CBC with Differential/Platelet

## 2014-05-02 LAB — HIV ANTIBODY (ROUTINE TESTING W REFLEX): HIV 1&2 Ab, 4th Generation: NONREACTIVE

## 2014-05-07 ENCOUNTER — Telehealth: Payer: Self-pay | Admitting: Family Medicine

## 2014-05-07 NOTE — Telephone Encounter (Signed)
Pt calling to report that Dr. Melina Schools is recommending his PCP to order a sleep study.  Please follow up with pt to advise if this would be appropriate.

## 2014-05-08 NOTE — Telephone Encounter (Signed)
Called [facility] and was sent to medical records VM and left a message for someone TCB.

## 2014-05-08 NOTE — Telephone Encounter (Signed)
LM on pt vm tcb  

## 2014-05-08 NOTE — Telephone Encounter (Signed)
Pt states he does not know why Dr. Rolena Infante is wanting him to have a sleep study done when he is not having any problems. His wife says he snores in his sleep but that's it and he really does not see a need to have this done. He would like for you to discuss this with Dr. Rolena Infante since he didn't give pt a legit reason as to why this needs to be done. Pt has an appointment with you next month and he said he will discuss this with you after you talk to Dr. Rolena Infante at that visit.

## 2014-05-08 NOTE — Telephone Encounter (Signed)
Mark [name] let's check with Dr. Rolena Infante nursing staff and ask for notes detailing why they think he needs sleep study. If nurse states this information is not present, let's have Dr. Rolena Infante give me a call since he is requesting [place] to order this.

## 2014-05-09 NOTE — Telephone Encounter (Signed)
[facility] will be faxing over pt last OV

## 2014-06-12 ENCOUNTER — Other Ambulatory Visit: Payer: Self-pay | Admitting: Internal Medicine

## 2014-06-12 ENCOUNTER — Ambulatory Visit (INDEPENDENT_AMBULATORY_CARE_PROVIDER_SITE_OTHER): Payer: 59 | Admitting: Family Medicine

## 2014-06-12 ENCOUNTER — Encounter: Payer: Self-pay | Admitting: Family Medicine

## 2014-06-12 VITALS — BP 110/72 | HR 70 | Temp 98.6°F | Wt 197.0 lb

## 2014-06-12 DIAGNOSIS — M5442 Lumbago with sciatica, left side: Secondary | ICD-10-CM

## 2014-06-12 DIAGNOSIS — I10 Essential (primary) hypertension: Secondary | ICD-10-CM | POA: Diagnosis not present

## 2014-06-12 NOTE — Progress Notes (Signed)
Garret Reddish, MD Phone: [phone]  Subjective:   Mark [name] is a 64 y.o. year old very pleasant male patient who presents with the following:  Low back pain- improved but persistent Hypertension-Weight and BP recheck- both improved- remains on inderide BP Readings from Last 3 Encounters:  06/12/14 110/72  05/01/14 102/72  02/11/14 142/84  Had lost 18 pounds in 4 months at time of last visit and BP was much lower than normal. He had leukopenia on initial bloodwork and follow up CBC/HIV were normal. As he reflects at this point he thinks that he was just not eating as much due to not getting up early and having a long day. His exercise is limited still due to back pain but he is doing the exercises as instructed by [facility].   He was on tramadol but there was concern for serotonin syndrome and he was switched to meloxicam which has not been as effective.  ROS-Denies any CP, HA, SOB, blurry vision, LE edema. No fecal incontinence, urinary incontinence, saddle aneshtesia. No nausea/vomiting  Past Medical History- Patient Active Problem List   Diagnosis Date Noted  . Back pain 10/09/2013    Priority: Medium  . Anxiety state, unspecified 09/01/2009    Priority: Medium  . HYPERLIPIDEMIA 06/11/2007    Priority: Medium  . Asthma 03/12/2007    Priority: Medium  . Essential hypertension 03/07/2007    Priority: Medium  . Migraine headache 03/07/2007    Priority: Medium  . PROSTATE CANCER, HX OF 03/07/2007    Priority: Medium  . Allergic rhinitis 11/22/2013    Priority: Low  . Overweight(278.02) 01/10/2012    Priority: Low  . Gastroparesis 05/01/2009    Priority: Low  . HIATAL HERNIA 04/28/2009    Priority: Low  . GERD 04/02/2009    Priority: Low   Medications- reviewed and updated Current Outpatient Prescriptions  Medication Sig Dispense Refill  . fesoterodine (TOVIAZ) 4 MG TB24 tablet Take 4 mg by mouth daily.    . fexofenadine (ALLEGRA) 180 MG tablet Take 180  mg by mouth daily.    . fluticasone (FLONASE) 50 MCG/ACT nasal spray USE ONE SPRAY AS DIRECTED ONCE DAILY 16 g 1  . meloxicam (MOBIC) 7.5 MG tablet Take 7.5 mg by mouth daily.    [name] Kitchen omeprazole (PRILOSEC) 40 MG capsule Take 40 mg by mouth 2 (two) times daily.    [name] Kitchen PARoxetine (PAXIL) 20 MG tablet Take 1 tablet (20 mg total) by mouth at bedtime. 90 tablet 1  . PRESCRIPTION MEDICATION Take 10 mg by mouth 3 (three) times daily. MED NAME: Domperidone.    . propranolol-hydrochlorothiazide (INDERIDE) 40-25 MG per tablet Take 1 tablet by mouth 2 (two) times daily. 90 tablet 0  . simvastatin (ZOCOR) 40 MG tablet TAKE ONE TABLET AT BEDTIME 90 tablet 2  . albuterol (PROVENTIL HFA;VENTOLIN HFA) 108 (90 BASE) MCG/ACT inhaler Inhale 2 puffs into the lungs every 6 (six) hours as needed for wheezing or shortness of breath.    [name] Kitchen azelastine (ASTELIN) 0.1 % nasal spray Place 2 sprays into both nostrils daily. Use in each nostril as directed    . butalbital-acetaminophen-caffeine (FIORICET, ESGIC) 50-325-40 MG per tablet TAKE 1 TABLET BY MOUTH EVERY DAY AS NEEDED (Patient not taking: Reported on 06/12/2014) 30 tablet 5  . diazepam (VALIUM) 5 MG tablet Take 1 tablet (5 mg total) by mouth every 8 (eight) hours as needed for anxiety. (Patient not taking: Reported on 06/12/2014) 30 tablet 1  . ondansetron (ZOFRAN) 4  MG tablet Take 1 tablet (4 mg total) by mouth every 8 (eight) hours as needed. (Patient not taking: Reported on 06/12/2014) 90 tablet 1  . PROAIR HFA 108 (90 BASE) MCG/ACT inhaler INHALE 2 PUFFS 4 TIMES A DAY AS DIRECTED (NEEDS OFFICE VISIT) (Patient not taking: Reported on 06/12/2014) 8.5 each 1  . promethazine (PHENERGAN) 25 MG tablet Take 1 tablet (25 mg total) by mouth every 6 (six) hours as needed for nausea or vomiting. (Patient not taking: Reported on 06/12/2014) 90 tablet 1   Objective: BP 110/72 mmHg  Pulse 70  Temp(Src) 98.6 F (37 C)  Wt 197 lb (89.359 kg) Gen: NAD, resting comfortably in chair CV:  RRR no murmurs rubs or gallops Lungs: CTAB no crackles, wheeze, rhonchi Abdomen: soft/nontender/nondistended/normal bowel sounds. No rebound or guarding. No hepatosplenomegaly Ext: no edema Skin: warm, dry, no rash Neuro: grossly normal, moves all extremities  Assessment/Plan:  Essential hypertension BP controlled on Propranolol-hctz 40-25mg . Had been far lower than normal at 102/72 and wanted to reevaluate.  Weight loss has resolved with weight up 6 lbs. Brought back as it was perceived to be unintentional weight loss. On further evaluation, patient believes he was simply not eating as well in AM. This has improved and weight has stabilized. Follow up 6 months.    Back pain Weaned to mobic and valium per Dr. Rolena Infante (formerly percocet). Tramadol stopped due to potential serotonin syndrome. Pain is worsening. We discussed I thought risk of serotonin syndrome was low and would consider tramadol through this office with warning precautions once he graduates from Dr. Rolena Infante and physical therapy care. Follow up 6 months.    6 months.

## 2014-06-12 NOTE — Patient Instructions (Signed)
Blood pressure and weight look much better. Labs came back reassuring as well.   Let's check in 6 months from now. No changes to meds today.   If you graduate out of [facility] on meloxicam and pain continues to be poorly controlled, I would consider tramadol in the long run and give you warning precautions for serotonin syndrome. Continue with therapy for now and hopefully pain will improve.

## 2014-06-12 NOTE — Assessment & Plan Note (Signed)
BP controlled on Propranolol-hctz 40-25mg . Had been far lower than normal at 102/72 and wanted to reevaluate.  Weight loss has resolved with weight up 6 lbs. Brought back as it was perceived to be unintentional weight loss. On further evaluation, patient believes he was simply not eating as well in AM. This has improved and weight has stabilized. Follow up 6 months.

## 2014-06-12 NOTE — Assessment & Plan Note (Signed)
Weaned to [employer] and valium per Dr. Rolena Infante (formerly percocet). Tramadol stopped due to potential serotonin syndrome. Pain is worsening. We discussed I thought risk of serotonin syndrome was low and would consider tramadol through this office with warning precautions once he graduates from Dr. Rolena Infante and physical therapy care. Follow up 6 months.

## 2014-06-13 NOTE — Telephone Encounter (Signed)
May fill 

## 2014-06-13 NOTE — Telephone Encounter (Signed)
Faxed Rx for diazepam (VALIUM), 5 MG, #30 with one refill to [facility]. No more refills allowed until after August 10, 2014.

## 2014-10-15 ENCOUNTER — Telehealth: Payer: Self-pay | Admitting: Family Medicine

## 2014-10-15 NOTE — Telephone Encounter (Signed)
[place] sickness patchs or pills. Pt will be at [facility] for 7 day. [facility] [place]. Pt will be this friday

## 2014-10-16 MED ORDER — SCOPOLAMINE 1 MG/3DAYS TD PT72: 1.0000 | MEDICATED_PATCH | TRANSDERMAL | Status: DC

## 2014-10-16 NOTE — Telephone Encounter (Signed)
See below

## 2014-10-16 NOTE — Telephone Encounter (Signed)
Sent in patch. He will only need 3 of them to cover whole trip but rx for 10 can be used in future

## 2014-10-16 NOTE — Telephone Encounter (Signed)
Called and lm on pt vm. 

## 2014-12-12 ENCOUNTER — Ambulatory Visit: Payer: 59

## 2014-12-12 ENCOUNTER — Encounter: Payer: Self-pay | Admitting: Family Medicine

## 2014-12-12 ENCOUNTER — Ambulatory Visit (INDEPENDENT_AMBULATORY_CARE_PROVIDER_SITE_OTHER): Payer: 59 | Admitting: Family Medicine

## 2014-12-12 VITALS — BP 140/100 | HR 55 | Temp 98.0°F | Wt 193.0 lb

## 2014-12-12 DIAGNOSIS — M5442 Lumbago with sciatica, left side: Secondary | ICD-10-CM | POA: Diagnosis not present

## 2014-12-12 DIAGNOSIS — I10 Essential (primary) hypertension: Secondary | ICD-10-CM | POA: Diagnosis not present

## 2014-12-12 DIAGNOSIS — E785 Hyperlipidemia, unspecified: Secondary | ICD-10-CM

## 2014-12-12 DIAGNOSIS — J4520 Mild intermittent asthma, uncomplicated: Secondary | ICD-10-CM | POA: Diagnosis not present

## 2014-12-12 DIAGNOSIS — Z23 Encounter for immunization: Secondary | ICD-10-CM | POA: Diagnosis not present

## 2014-12-12 NOTE — Assessment & Plan Note (Signed)
S: controlled.  On simvastatin 40mg   Lab Results  Component Value Date   CHOL 147 04/24/2014   HDL 39.50 04/24/2014   LDLCALC 78 04/24/2014   LDLDIRECT 88.4 01/10/2012   TRIG 149.0 04/24/2014   CHOLHDL 4 04/24/2014  A/P:Continue current meds

## 2014-12-12 NOTE — Assessment & Plan Note (Signed)
S: typically controlled on Propranolol-hctz 40-25mg .   BP Readings from Last 3 Encounters:  12/12/14 140/100  06/12/14 110/72  05/01/14 102/72  coffee this AM, poor sleep, meloxicam- poor control today A/P:Continue current meds for now. Home monitoring advised with 6-8 week follow up given trend in wrong direction. Avoid coffee, mobic, poor sleep before visit.

## 2014-12-12 NOTE — Assessment & Plan Note (Signed)
S: controlled with prn mobic or tramadol mainly. Tramadol works better. Has been released by Dr. Rolena Infante unless he has worsening issues A/P:Continue current meds:  Discussed to bring bottle to next visit to see how much is left and I can provide further refills

## 2014-12-12 NOTE — Assessment & Plan Note (Signed)
S: remains controlled with Albuterol once a week typically A/P: continue current rx

## 2014-12-12 NOTE — Progress Notes (Signed)
Garret Reddish, MD  Subjective:  Mark [name] is a 64 y.o. year old very pleasant male patient who presents for/with See problem oriented charting ROS- no chest pain or shortness of breath, headache, blurry vision, fecal or urinary incontinence  Past Medical History-  Patient Active Problem List   Diagnosis Date Noted  . Back pain 10/09/2013    Priority: Medium  . Anxiety state, unspecified 09/01/2009    Priority: Medium  . HYPERLIPIDEMIA 06/11/2007    Priority: Medium  . Asthma 03/12/2007    Priority: Medium  . Essential hypertension 03/07/2007    Priority: Medium  . Migraine headache 03/07/2007    Priority: Medium  . PROSTATE CANCER, HX OF 03/07/2007    Priority: Medium  . Allergic rhinitis 11/22/2013    Priority: Low  . Overweight(278.02) 01/10/2012    Priority: Low  . Gastroparesis 05/01/2009    Priority: Low  . HIATAL HERNIA 04/28/2009    Priority: Low  . GERD 04/02/2009    Priority: Low    Medications- reviewed and updated Current Outpatient Prescriptions  Medication Sig Dispense Refill  . albuterol (PROVENTIL HFA;VENTOLIN HFA) 108 (90 BASE) MCG/ACT inhaler Inhale 2 puffs into the lungs every 6 (six) hours as needed for wheezing or shortness of breath.    [name] Kitchen azelastine (ASTELIN) 0.1 % nasal spray Place 2 sprays into both nostrils daily. Use in each nostril as directed    . butalbital-acetaminophen-caffeine (FIORICET, ESGIC) 50-325-40 MG per tablet TAKE 1 TABLET BY MOUTH EVERY DAY AS NEEDED 30 tablet 5  . diazepam (VALIUM) 5 MG tablet TAKE 1 TABLET BY MOUTH EVERY 8 HOURS AS NEEDED FOR ANXIETY 30 tablet 1  . fesoterodine (TOVIAZ) 4 MG TB24 tablet Take 4 mg by mouth daily.    . fexofenadine (ALLEGRA) 180 MG tablet Take 180 mg by mouth daily.    . fluticasone (FLONASE) 50 MCG/ACT nasal spray USE ONE SPRAY AS DIRECTED ONCE DAILY 16 g 1  . meloxicam (MOBIC) 7.5 MG tablet Take 7.5 mg by mouth daily.    [name] Kitchen omeprazole (PRILOSEC) 40 MG capsule Take 40 mg by mouth 2 (two)  times daily.    [name] Kitchen PARoxetine (PAXIL) 20 MG tablet Take 1 tablet (20 mg total) by mouth at bedtime. 90 tablet 1  . PROAIR HFA 108 (90 BASE) MCG/ACT inhaler INHALE 2 PUFFS 4 TIMES A DAY AS DIRECTED (NEEDS OFFICE VISIT) 8.5 each 1  . propranolol-hydrochlorothiazide (INDERIDE) 40-25 MG per tablet Take 1 tablet by mouth 2 (two) times daily. 90 tablet 0  . scopolamine (TRANSDERM-SCOP) 1 MG/3DAYS Place 1 patch (1.5 mg total) onto the skin every 3 (three) days. 10 patch 0  . simvastatin (ZOCOR) 40 MG tablet TAKE ONE TABLET AT BEDTIME 90 tablet 2  . PRESCRIPTION MEDICATION Take 10 mg by mouth 3 (three) times daily. MED NAME: Domperidone.     Objective: BP 140/100 mmHg  Pulse 55  Temp(Src) 98 F (36.7 C) (Oral)  Wt 193 lb (87.544 kg)  Repeated BP 3 occasions and similar Gen: NAD, resting comfortably CV: RRR no murmurs rubs or gallops Lungs: CTAB no crackles, wheeze, rhonchi Abdomen: soft/nontender/nondistended/normal bowel sounds. No rebound or guarding.  Ext: no edema Skin: warm, dry Neuro: grossly normal, moves all extremities  Assessment/Plan:  Essential hypertension S: typically controlled on Propranolol-hctz 40-25mg .   BP Readings from Last 3 Encounters:  12/12/14 140/100  06/12/14 110/72  05/01/14 102/72  coffee this AM, poor sleep, meloxicam- poor control today A/P:Continue current meds for now. Home monitoring advised  with 6-8 week follow up given trend in wrong direction. Avoid coffee, mobic, poor sleep before visit.    Asthma S: remains controlled with Albuterol once a week typically A/P: continue current rx   Back pain S: controlled with prn mobic or tramadol mainly. Tramadol works better. Has been released by Dr. Rolena Infante unless he has worsening issues A/P:Continue current meds:  Discussed to bring bottle to next visit to see how much is left and I can provide further refills   Hyperlipidemia S: controlled.  On simvastatin 40mg   Lab Results  Component Value Date    CHOL 147 04/24/2014   HDL 39.50 04/24/2014   LDLCALC 78 04/24/2014   LDLDIRECT 88.4 01/10/2012   TRIG 149.0 04/24/2014   CHOLHDL 4 04/24/2014  A/P:Continue current meds   6-8 week BP follow up  Orders Placed This Encounter  Procedures  . Flu Vaccine QUAD 36+ mos PF IM (Fluarix & Fluzone Quad PF)

## 2014-12-12 NOTE — Progress Notes (Signed)
Pre visit review using our clinic review tool, if applicable. No additional management support is needed unless otherwise documented below in the visit note. 

## 2014-12-12 NOTE — Patient Instructions (Signed)
Your blood pressure trend concerns me a little- higher today than previous. I would like for you to use a home cuff to check at least 4x a week. Your goal is <140/90. If you note in the next few weeks that it is higher than our goal, see me sooner. Otherwise, see me in 6-8 weeks. Bring your home cuff and your log of blood pressures with you to visit.   Could be coffee, poor sleep or the meloxicam. Try to get a good nights rest, avoid cofee, and hold meloxicam day you see me- take tramadol instead at that time  Health Maintenance Due  Topic Date Due  . INFLUENZA VACCINE - got today 09/29/2014

## 2014-12-18 ENCOUNTER — Ambulatory Visit: Payer: 59

## 2015-01-11 ENCOUNTER — Other Ambulatory Visit: Payer: Self-pay | Admitting: Internal Medicine

## 2015-01-15 ENCOUNTER — Telehealth: Payer: Self-pay

## 2015-01-15 NOTE — Telephone Encounter (Signed)
Left vm for pt to call back. Requesting refills of paxil. Former Dr. Olevia Perches pt.

## 2015-01-16 NOTE — Telephone Encounter (Signed)
He will need to discuss with PMD for both Paxil and Valium. I cant refill them without seeing the patient

## 2015-01-16 NOTE — Telephone Encounter (Signed)
Patient has been scheduled with Dr. Silverio Decamp.  His pharmacy requested refills of Paxil and Valium. Is it ok to refill until appt?

## 2015-01-16 NOTE — Telephone Encounter (Signed)
Will contact pt and inform him of this.

## 2015-01-30 ENCOUNTER — Ambulatory Visit: Payer: 59 | Admitting: Family Medicine

## 2015-01-30 DIAGNOSIS — Z0289 Encounter for other administrative examinations: Secondary | ICD-10-CM

## 2015-02-04 ENCOUNTER — Ambulatory Visit (INDEPENDENT_AMBULATORY_CARE_PROVIDER_SITE_OTHER): Payer: 59 | Admitting: Family Medicine

## 2015-02-04 ENCOUNTER — Encounter: Payer: Self-pay | Admitting: Family Medicine

## 2015-02-04 VITALS — BP 122/82 | HR 66 | Temp 98.5°F | Wt 198.0 lb

## 2015-02-04 DIAGNOSIS — K3184 Gastroparesis: Secondary | ICD-10-CM | POA: Diagnosis not present

## 2015-02-04 DIAGNOSIS — J4520 Mild intermittent asthma, uncomplicated: Secondary | ICD-10-CM

## 2015-02-04 DIAGNOSIS — F411 Generalized anxiety disorder: Secondary | ICD-10-CM

## 2015-02-04 DIAGNOSIS — I10 Essential (primary) hypertension: Secondary | ICD-10-CM

## 2015-02-04 MED ORDER — AMOXICILLIN-POT CLAVULANATE 875-125 MG PO TABS: 1.0000 | ORAL_TABLET | Freq: Two times a day (BID) | ORAL | Status: DC

## 2015-02-04 MED ORDER — PROPRANOLOL-HCTZ 40-25 MG PO TABS: 1.0000 | ORAL_TABLET | Freq: Two times a day (BID) | ORAL | Status: DC

## 2015-02-04 MED ORDER — DIAZEPAM 5 MG PO TABS: 5.0000 mg | ORAL_TABLET | Freq: Three times a day (TID) | ORAL | Status: DC | PRN

## 2015-02-04 MED ORDER — PAROXETINE HCL 20 MG PO TABS: 20.0000 mg | ORAL_TABLET | Freq: Every day | ORAL | Status: DC

## 2015-02-04 NOTE — Assessment & Plan Note (Signed)
S:Paxil and valium-as related to stomach/gastroparesis and anxiety related to discomfort- had been prescribed for prolonged period by Dr. Olevia Perches. Dr. Olevia Perches has since retired and new GI MD advised PCP follow up A/P: we will continue paxil 20mg . Refilled valium as well though discussed 2-3x a week maximum. If above this would certainly titrate up paxil

## 2015-02-04 NOTE — Progress Notes (Signed)
Garret Reddish, MD  Subjective:  Mark [name] is a 64 y.o. year old very pleasant male patient who presents for/with See problem oriented charting ROS- no chest pain. Shortness of breath or wheeze sparingly controlled with albuterol. No headache or blurry vision.   Past Medical History-  Patient Active Problem List   Diagnosis Date Noted  . Back pain 10/09/2013    Priority: Medium  . Anxiety state 09/01/2009    Priority: Medium  . Hyperlipidemia 06/11/2007    Priority: Medium  . Asthma 03/12/2007    Priority: Medium  . Essential hypertension 03/07/2007    Priority: Medium  . Migraine headache 03/07/2007    Priority: Medium  . PROSTATE CANCER, HX OF 03/07/2007    Priority: Medium  . Allergic rhinitis 11/22/2013    Priority: Low  . Overweight(278.02) 01/10/2012    Priority: Low  . Gastroparesis 05/01/2009    Priority: Low  . HIATAL HERNIA 04/28/2009    Priority: Low  . GERD 04/02/2009    Priority: Low    Medications- reviewed and updated Current Outpatient Prescriptions  Medication Sig Dispense Refill  . azelastine (ASTELIN) 0.1 % nasal spray Place 2 sprays into both nostrils daily. Use in each nostril as directed    . fesoterodine (TOVIAZ) 4 MG TB24 tablet Take 4 mg by mouth daily.    . fexofenadine (ALLEGRA) 180 MG tablet Take 180 mg by mouth daily.    . fluticasone (FLONASE) 50 MCG/ACT nasal spray USE ONE SPRAY AS DIRECTED ONCE DAILY 16 g 1  . omeprazole (PRILOSEC) 40 MG capsule Take 40 mg by mouth 2 (two) times daily.    [name] Kitchen PARoxetine (PAXIL) 20 MG tablet Take 1 tablet (20 mg total) by mouth at bedtime. 90 tablet 1  . PRESCRIPTION MEDICATION Take 10 mg by mouth 3 (three) times daily. MED NAME: Domperidone.    . propranolol-hydrochlorothiazide (INDERIDE) 40-25 MG tablet Take 1 tablet by mouth 2 (two) times daily. 180 tablet 3  . scopolamine (TRANSDERM-SCOP) 1 MG/3DAYS Place 1 patch (1.5 mg total) onto the skin every 3 (three) days. 10 patch 0  . simvastatin  (ZOCOR) 40 MG tablet TAKE ONE TABLET AT BEDTIME 90 tablet 2  . albuterol (PROVENTIL HFA;VENTOLIN HFA) 108 (90 BASE) MCG/ACT inhaler Inhale 2 puffs into the lungs every 6 (six) hours as needed for wheezing or shortness of breath.    [name] Kitchen amoxicillin-clavulanate (AUGMENTIN) 875-125 MG tablet Take 1 tablet by mouth 2 (two) times daily. 14 tablet 0  . butalbital-acetaminophen-caffeine (FIORICET, ESGIC) 50-325-40 MG per tablet TAKE 1 TABLET BY MOUTH EVERY DAY AS NEEDED (Patient not taking: Reported on 02/04/2015) 30 tablet 5  . diazepam (VALIUM) 5 MG tablet Take 1 tablet (5 mg total) by mouth every 8 (eight) hours as needed. for anxiety 45 tablet 0  . meloxicam (MOBIC) 7.5 MG tablet Take 7.5 mg by mouth daily.    [name] Kitchen PROAIR HFA 108 (90 BASE) MCG/ACT inhaler INHALE 2 PUFFS 4 TIMES A DAY AS DIRECTED (NEEDS OFFICE VISIT) (Patient not taking: Reported on 02/04/2015) 8.5 each 1   No current facility-administered medications for this visit.    Objective: BP 122/82 mmHg  Pulse 66  Temp(Src) 98.5 F (36.9 C)  Wt 198 lb (89.812 kg) Gen: NAD, resting comfortably Mucous membranes are moist. Nares erythematous with light amount of discharge. Maxillary sinus tenderness ntoed.  CV: RRR no murmurs rubs or gallops Lungs: CTAB no crackles, wheeze, rhonchi Abdomen: soft/nontender/nondistended/normal bowel sounds. No rebound or guarding.  Ext: no edema  Skin: warm, dry Neuro: grossly normal, moves all extremities  Assessment/Plan:  Anxiety state S:Paxil and valium-as related to stomach/gastroparesis and anxiety related to discomfort- had been prescribed for prolonged period by Dr. Olevia Perches. Dr. Olevia Perches has since retired and new GI MD advised PCP follow up A/P: we will continue paxil 20mg . Refilled valium as well though discussed 2-3x a week maximum. If above this would certainly titrate up paxil  Essential hypertension S: controlled. On  Propranolol-hctz 40-25mg .  Home cuff readings abotu 50% of time controlled. On my  final repeat of day 149/95 on his cuff and I got 150/94 so very similar but elevated.  BP Readings from Last 3 Encounters:  02/04/15 122/82  12/12/14 140/100  06/12/14 110/72  A/P:Continue current meds:  Extensive discussion on BP goals and home monitoring as well as restarting exercise. Given some varying control including on same day of visit- we opted for 2-3 month follow up and logging home BPs in this time.    Gastroparesis S: see anxiety. paxil also tends to control nausea.  A/P: will assume rx from GI unless new GI physician would like to continue. May titrate this dose up if needed for anxiety    Asthma Sinusitis bacterial as well S: continues to do well on albuterol prn about once a week. No controller. Does have some sinus congestion and pressure for last 6 weeks. On great allergy regimen of  Astelin, allegra, flonase but despite that has continued issues A/P: continue prn albuterol. Thankful sinusitis symptoms have not triggered asthma flare. We will treat sinusitis with augmentin at this point given >>10 days symtpoms.   2-3 m onth BP check Return precautions advised.   Meds ordered this encounter  Medications  . propranolol-hydrochlorothiazide (INDERIDE) 40-25 MG tablet    Sig: Take 1 tablet by mouth 2 (two) times daily.    Dispense:  180 tablet    Refill:  3  . PARoxetine (PAXIL) 20 MG tablet    Sig: Take 1 tablet (20 mg total) by mouth at bedtime.    Dispense:  90 tablet    Refill:  1  . diazepam (VALIUM) 5 MG tablet    Sig: Take 1 tablet (5 mg total) by mouth every 8 (eight) hours as needed. for anxiety    Dispense:  45 tablet    Refill:  0  . amoxicillin-clavulanate (AUGMENTIN) 875-125 MG tablet    Sig: Take 1 tablet by mouth 2 (two) times daily.    Dispense:  14 tablet    Refill:  0

## 2015-02-04 NOTE — Assessment & Plan Note (Signed)
S: see anxiety. paxil also tends to control nausea.  A/P: will assume rx from GI unless new GI physician would like to continue. May titrate this dose up if needed for anxiety

## 2015-02-04 NOTE — Patient Instructions (Signed)
Refilled paxil and valium. I really want the valium to be 2-3x a week maximum. Please write down how often you take this.   Your blood pressure trend concerns me (some of your home readings and the repeat value todaY_. I would like for you to use a home cuff to check at least 4x a week. Your goal is <140/90. If you note in the next few weeks that it is higher than our goal, see me sooner. Otherwise, see me in 2-3 months. Bring your home cuff and your log of blood pressures with you to visit.   Augmentin for sinus infection- if this doesn't clear it is likely allergy related

## 2015-02-04 NOTE — Assessment & Plan Note (Signed)
Sinusitis bacterial as well S: continues to do well on albuterol prn about once a week. No controller. Does have some sinus congestion and pressure for last 6 weeks. On great allergy regimen of  Astelin, allegra, flonase but despite that has continued issues A/P: continue prn albuterol. Thankful sinusitis symptoms have not triggered asthma flare. We will treat sinusitis with augmentin at this point given >>10 days symtpoms.

## 2015-02-04 NOTE — Assessment & Plan Note (Signed)
S: controlled. On  Propranolol-hctz 40-25mg .  Home cuff readings abotu 50% of time controlled. On my final repeat of day 149/95 on his cuff and I got 150/94 so very similar but elevated.  BP Readings from Last 3 Encounters:  02/04/15 122/82  12/12/14 140/100  06/12/14 110/72  A/P:Continue current meds:  Extensive discussion on BP goals and home monitoring as well as restarting exercise. Given some varying control including on same day of visit- we opted for 2-3 month follow up and logging home BPs in this time.

## 2015-02-10 ENCOUNTER — Other Ambulatory Visit: Payer: Self-pay | Admitting: Family Medicine

## 2015-02-20 ENCOUNTER — Ambulatory Visit: Payer: 59 | Admitting: Family Medicine

## 2015-03-24 ENCOUNTER — Encounter: Payer: Self-pay | Admitting: Gastroenterology

## 2015-03-24 ENCOUNTER — Ambulatory Visit (INDEPENDENT_AMBULATORY_CARE_PROVIDER_SITE_OTHER): Payer: 59 | Admitting: Gastroenterology

## 2015-03-24 VITALS — BP 120/82 | HR 76 | Ht 68.75 in | Wt 204.2 lb

## 2015-03-24 DIAGNOSIS — K219 Gastro-esophageal reflux disease without esophagitis: Secondary | ICD-10-CM

## 2015-03-24 DIAGNOSIS — K3184 Gastroparesis: Secondary | ICD-10-CM

## 2015-03-24 DIAGNOSIS — R112 Nausea with vomiting, unspecified: Secondary | ICD-10-CM | POA: Diagnosis not present

## 2015-03-24 MED ORDER — PROMETHAZINE HCL 12.5 MG PO TABS: 12.5000 mg | ORAL_TABLET | Freq: Four times a day (QID) | ORAL | Status: DC | PRN

## 2015-03-24 MED ORDER — ONDANSETRON HCL 4 MG PO TABS: 4.0000 mg | ORAL_TABLET | Freq: Three times a day (TID) | ORAL | Status: DC | PRN

## 2015-03-24 NOTE — Patient Instructions (Signed)
Gastroparesis diet given We will send Zofran and phenergan to your pharmacy  Follow up in 6 months

## 2015-03-24 NOTE — Progress Notes (Signed)
Mark [name]    QZ:[id number]    December 14, 1950  Primary Care Physician:Stephen Yong Channel, MD  Referring Physician: Marin Olp, MD [place], [place] [address]  Chief complaint:  Nausea  HPI: 65 year old African-American male with a history of gastroesophageal reflux, gastroparesis, chronic nausea here for follow-up visit. He continues to have intermittent nausea which is mostly triggered by greasy food and fiber and is relieved by Zofran, Prilosec and Phenergan. He was on domperidone for a long time but this was discontinued when the medication was no longer available. He has been on disability for his back after he underwent back surgery in August 2015. He has no specific GI issues today. He needs a refill on his medications. His last upper endoscopy in February 2011 showed gastritis and esophagitis. Last colonoscopy in May 2014 showed a tubular adenoma. His next colonoscopy is scheduled for May 2019. A gastric emptying scan in February 2011 resulted in a 36% retention in 2 hours. His HIDA scan was normal at 65% ejection fraction.    Outpatient Encounter Prescriptions as of 03/24/2015  Medication Sig  . albuterol (PROVENTIL HFA;VENTOLIN HFA) 108 (90 BASE) MCG/ACT inhaler Inhale 2 puffs into the lungs every 6 (six) hours as needed for wheezing or shortness of breath.  [name] Kitchen azelastine (ASTELIN) 0.1 % nasal spray Place 2 sprays into both nostrils daily. Use in each nostril as directed  . butalbital-acetaminophen-caffeine (FIORICET, ESGIC) 50-325-40 MG per tablet TAKE 1 TABLET BY MOUTH EVERY DAY AS NEEDED  . diazepam (VALIUM) 5 MG tablet Take 1 tablet (5 mg total) by mouth every 8 (eight) hours as needed. for anxiety  . fesoterodine (TOVIAZ) 4 MG TB24 tablet Take 4 mg by mouth daily.  . fexofenadine (ALLEGRA) 180 MG tablet Take 180 mg by mouth daily.  . fluticasone (FLONASE) 50 MCG/ACT nasal spray USE ONE SPRAY AS DIRECTED ONCE DAILY  . meloxicam (MOBIC) 7.5 MG  tablet Take 7.5 mg by mouth daily.  [name] Kitchen omeprazole (PRILOSEC) 40 MG capsule Take 40 mg by mouth 2 (two) times daily.  . ondansetron (ZOFRAN) 4 MG tablet Take 4 mg by mouth every 8 (eight) hours as needed for nausea or vomiting.  [name] Kitchen PARoxetine (PAXIL) 20 MG tablet Take 1 tablet (20 mg total) by mouth at bedtime.  [name] Kitchen PROAIR HFA 108 (90 BASE) MCG/ACT inhaler INHALE 2 PUFFS 4 TIMES A DAY AS DIRECTED (NEEDS OFFICE VISIT)  . promethazine (PHENERGAN) 25 MG tablet Take 25 mg by mouth every 6 (six) hours as needed for nausea or vomiting.  . propranolol-hydrochlorothiazide (INDERIDE) 40-25 MG tablet Take 1 tablet by mouth 2 (two) times daily.  [name] Kitchen scopolamine (TRANSDERM-SCOP) 1 MG/3DAYS Place 1 patch (1.5 mg total) onto the skin every 3 (three) days.  . simvastatin (ZOCOR) 40 MG tablet TAKE ONE TABLET AT BEDTIME  . traMADol (ULTRAM) 50 MG tablet Take 50 mg by mouth every 6 (six) hours as needed.  . ondansetron (ZOFRAN) 4 MG tablet Take 1 tablet (4 mg total) by mouth every 8 (eight) hours as needed for nausea or vomiting.  . promethazine (PHENERGAN) 12.5 MG tablet Take 1 tablet (12.5 mg total) by mouth every 6 (six) hours as needed for nausea or vomiting.  . [DISCONTINUED] amoxicillin-clavulanate (AUGMENTIN) 875-125 MG tablet Take 1 tablet by mouth 2 (two) times daily.  . [DISCONTINUED] PRESCRIPTION MEDICATION Take 10 mg by mouth 3 (three) times daily. MED NAME: Domperidone.   No facility-administered encounter medications on  file as of 03/24/2015.    Allergies as of 03/24/2015 - Review Complete 03/24/2015  Allergen Reaction Noted  . Propoxyphene hcl Nausea And Vomiting 03/27/2006    Past Medical History  Diagnosis Date  . HYPERTENSION 03/07/2007  . Headache(784.0) 03/07/2007  . PROSTATE CANCER, HX OF 03/07/2007  . ASTHMA 03/12/2007  . HYPERLIPIDEMIA 06/11/2007  . GERD 04/02/2009  . HIATAL HERNIA 04/28/2009  . TRANSAMINASES, SERUM, ELEVATED 04/28/2009  . Gastroparesis 05/01/2009  . Anxiety state, unspecified  09/01/2009  . Hiatal hernia   . Ulcer     as teenager  . Tubular adenoma of colon   . Polyp of colon, hyperplastic   . Pneumonia     hx of  . Cancer [facility])     prostate    Past Surgical History  Procedure Laterality Date  . Prostatectomy  07/10/06  . Appendectomy    . Rotator cuff repair      9/09 on right  . Back surgery  05/2011  . Colonoscopy    . Lumbar laminectomy/decompression microdiscectomy N/A 10/09/2013    Procedure: L4-5 DECOMPRESSION/FARAMINOTOMY REVISION ;  Surgeon: Melina Schools, MD;  Location: [facility];  Service: Orthopedics;  Laterality: N/A;  . Transforaminal lumbar interbody fusion (tlif) with pedicle screw fixation 1 level N/A 10/09/2013    Procedure: TLIF L5-S1;  Surgeon: Melina Schools, MD;  Location: [facility];  Service: Orthopedics;  Laterality: N/A;    Family History  Problem Relation Age of Onset  . Pancreatic cancer Mother 40  . Hypertension Father   . Hypertension Brother     x 2  . Colon cancer Neg Hx   . Rectal cancer Neg Hx   . Stomach cancer Neg Hx     Social History   Social History  . Marital Status: Married    Spouse Name: N/A  . Number of Children: 4  . Years of Education: N/A   Occupational History  . Disabled    Social History Main Topics  . Smoking status: Former Smoker -- 0.50 packs/day for 2 years    Types: Cigarettes    Quit date: 04/09/1983  . Smokeless tobacco: Never Used  . Alcohol Use: No  . Drug Use: No  . Sexual Activity: Yes   Other Topics Concern  . Not on file   Social History Narrative   Married 1974. Wife-Helen. 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       Working at [employer]: hunting, boat (off for 1 year in 2015)      Review of systems: Review of Systems  Constitutional: Negative for fever and chills.  HENT: Negative.   Eyes: Negative for  blurred vision.  Respiratory: Negative for cough, shortness of breath and wheezing.   Cardiovascular: Negative for chest pain and palpitations.  Gastrointestinal: as per HPI Genitourinary: Negative for dysuria, urgency, frequency and hematuria.  Musculoskeletal: Negative for myalgias, back pain and joint pain.  Skin: Negative for itching and rash.  Neurological: Negative for dizziness, tremors, focal weakness, seizures and loss of consciousness.  Endo/Heme/Allergies: Negative for environmental allergies.  Psychiatric/Behavioral: Negative for depression, suicidal ideas and hallucinations.  All other systems reviewed and are negative.   Physical Exam: Filed Vitals:   03/24/15 0819  BP: 120/82  Pulse: 76   Gen:  No acute distress HEENT:  EOMI, sclera anicteric Neck:     No masses; no thyromegaly Abd:      + bowel sounds; soft, non-tender; no palpable masses, no distension Ext:    No edema; adequate peripheral perfusion Skin:      Warm and dry; no rash Neuro: alert and oriented x 3 Psych: normal mood and affect  Data Reviewed:  As per history of present illness   Assessment and Plan/Recommendations:  65 year old male with history of GERD, gastroparesis with chronic intermittent nausea Nausea likely multifactorial, related to dietary noncompliance with gastroparesis, GERD and may also have a functional component Continue PPI and antireflux measures Advise small frequent meals, avoid high fiber/ high fat diet Instructions were provided for gastroparesis diet Extensive GI workup unrevealing except for mild delay at 2 hr gastric emptying Due for surveillance colonoscopy in May 2019 for adenoma Return in 6 months or sooner if needed Spent greater than 30 minutes managing his care and counseling patient  K. Denzil Magnuson , MD [phone] Mon-Fri 8a-5p [phone] after 5p, weekends, holidays

## 2015-04-24 ENCOUNTER — Other Ambulatory Visit: Payer: Self-pay

## 2015-04-24 MED ORDER — TRAMADOL HCL 50 MG PO TABS: 50.0000 mg | ORAL_TABLET | Freq: Four times a day (QID) | ORAL | Status: DC | PRN

## 2015-04-24 NOTE — Telephone Encounter (Signed)
Yes thanks 

## 2015-04-24 NOTE — Telephone Encounter (Signed)
Patient has requested a refill on Tramadol - ok to refill?

## 2015-04-24 NOTE — Telephone Encounter (Signed)
Refill ok? 

## 2015-04-27 ENCOUNTER — Other Ambulatory Visit: Payer: Self-pay

## 2015-05-07 ENCOUNTER — Encounter: Payer: Self-pay | Admitting: Family Medicine

## 2015-05-07 ENCOUNTER — Ambulatory Visit (INDEPENDENT_AMBULATORY_CARE_PROVIDER_SITE_OTHER): Payer: 59 | Admitting: Family Medicine

## 2015-05-07 VITALS — BP 140/80 | HR 64 | Temp 98.6°F | Wt 195.0 lb

## 2015-05-07 DIAGNOSIS — E785 Hyperlipidemia, unspecified: Secondary | ICD-10-CM

## 2015-05-07 DIAGNOSIS — I10 Essential (primary) hypertension: Secondary | ICD-10-CM | POA: Diagnosis not present

## 2015-05-07 MED ORDER — AMLODIPINE BESYLATE 2.5 MG PO TABS: 2.5000 mg | ORAL_TABLET | Freq: Every day | ORAL | Status: DC

## 2015-05-07 NOTE — Patient Instructions (Signed)
Blood pressure on home readings high about 20% of time. Let's try to get your blood pressure closer to 130 more regularly by addeding 2.5 mg of amlodipine. Continue your other blood pressure medicine.   If you have lightheadedness or dizziness with this please let me know. Continue to monitor blood pressure and check back in 3 months from now  I do want you to cut your simvastatin in half as the amlodipine actually makes simvastatin levels in your blood go higher.  If medicine works, we can eventually change simvastatin to 20mg  pill

## 2015-05-07 NOTE — Assessment & Plan Note (Signed)
S: poor control on Propranolol-hctz 40-25mg .  Home readings above 140 about 20-25% of time (home cuff verified). Did not increase his exercise  BP Readings from Last 3 Encounters:  05/07/15 140/80  03/24/15 120/82  02/04/15 122/82  A/P:Continue current meds but add amlodipine 2.5mg . He has had orthostatic symptoms in past if BP closer to 110 so will watch and he will call me if occurs

## 2015-05-07 NOTE — Assessment & Plan Note (Signed)
S: controlled previously on simvastatin 40mg  A/P: since starting amlodipine, cut simvastatin in half- in long run will call in 20mg  pill if amlodipine works well for BP

## 2015-05-07 NOTE — Progress Notes (Signed)
Garret Reddish, MD  Subjective:  Mark [name] is a 65 y.o. year old very pleasant male patient who presents for/with See problem oriented charting ROS- No chest pain or shortness of breath. No headache or blurry vision except does get HA if misses BP medicine  Past Medical History-  Patient Active Problem List   Diagnosis Date Noted  . Back pain 10/09/2013    Priority: Medium  . Anxiety state 09/01/2009    Priority: Medium  . Hyperlipidemia 06/11/2007    Priority: Medium  . Asthma 03/12/2007    Priority: Medium  . Essential hypertension 03/07/2007    Priority: Medium  . Migraine headache 03/07/2007    Priority: Medium  . PROSTATE CANCER, HX OF 03/07/2007    Priority: Medium  . Allergic rhinitis 11/22/2013    Priority: Low  . Overweight(278.02) 01/10/2012    Priority: Low  . Gastroparesis 05/01/2009    Priority: Low  . HIATAL HERNIA 04/28/2009    Priority: Low  . GERD 04/02/2009    Priority: Low    Medications- reviewed and updated Current Outpatient Prescriptions  Medication Sig Dispense Refill  . fesoterodine (TOVIAZ) 4 MG TB24 tablet Take 4 mg by mouth daily.    . fexofenadine (ALLEGRA) 180 MG tablet Take 180 mg by mouth daily.    . fluticasone (FLONASE) 50 MCG/ACT nasal spray USE ONE SPRAY AS DIRECTED ONCE DAILY 16 g 1  . meloxicam (MOBIC) 7.5 MG tablet Take 7.5 mg by mouth daily.    [name] Kitchen omeprazole (PRILOSEC) 40 MG capsule Take 40 mg by mouth 2 (two) times daily.    . ondansetron (ZOFRAN) 4 MG tablet Take 4 mg by mouth every 8 (eight) hours as needed for nausea or vomiting.    [name] Kitchen PARoxetine (PAXIL) 20 MG tablet Take 1 tablet (20 mg total) by mouth at bedtime. 90 tablet 1  . PROAIR HFA 108 (90 BASE) MCG/ACT inhaler INHALE 2 PUFFS 4 TIMES A DAY AS DIRECTED (NEEDS OFFICE VISIT) 8.5 each 1  . promethazine (PHENERGAN) 12.5 MG tablet Take 1 tablet (12.5 mg total) by mouth every 6 (six) hours as needed for nausea or vomiting. 90 tablet 3  .  propranolol-hydrochlorothiazide (INDERIDE) 40-25 MG tablet Take 1 tablet by mouth 2 (two) times daily. 180 tablet 3  . simvastatin (ZOCOR) 40 MG tablet TAKE ONE TABLET AT BEDTIME 90 tablet 2  . albuterol (PROVENTIL HFA;VENTOLIN HFA) 108 (90 BASE) MCG/ACT inhaler Inhale 2 puffs into the lungs every 6 (six) hours as needed for wheezing or shortness of breath. Reported on 05/07/2015    . azelastine (ASTELIN) 0.1 % nasal spray Place 2 sprays into both nostrils daily. Reported on 05/07/2015    . butalbital-acetaminophen-caffeine (FIORICET, ESGIC) 50-325-40 MG per tablet TAKE 1 TABLET BY MOUTH EVERY DAY AS NEEDED (Patient not taking: Reported on 05/07/2015) 30 tablet 5  . diazepam (VALIUM) 5 MG tablet Take 1 tablet (5 mg total) by mouth every 8 (eight) hours as needed. for anxiety (Patient not taking: Reported on 05/07/2015) 45 tablet 0  . traMADol (ULTRAM) 50 MG tablet Take 1 tablet (50 mg total) by mouth every 6 (six) hours as needed. (Patient not taking: Reported on 05/07/2015) 30 tablet 5   No current facility-administered medications for this visit.    Objective: BP 140/80 mmHg  Pulse 64  Temp(Src) 98.6 F (37 C)  Wt 195 lb (88.451 kg) Gen: NAD, resting comfortably CV: RRR no murmurs rubs or gallops Lungs: CTAB no crackles, wheeze, rhonchi Abdomen: soft/nontender/nondistended/normal bowel  sounds. overweight  Ext: no edema Skin: warm, dry Neuro: grossly normal, moves all extremities  Assessment/Plan:  Essential hypertension S: poor control on Propranolol-hctz 40-25mg .  Home readings above 140 about 20-25% of time (home cuff verified). Did not increase his exercise  BP Readings from Last 3 Encounters:  05/07/15 140/80  03/24/15 120/82  02/04/15 122/82  A/P:Continue current meds but add amlodipine 2.5mg . He has had orthostatic symptoms in past if BP closer to 110 so will watch and he will call me if occurs   Hyperlipidemia S: controlled previously on simvastatin 40mg  A/P: since starting  amlodipine, cut simvastatin in half- in long run will call in 20mg  pill if amlodipine works well for BP   also with nasal congestion for 1 week. On allergy meds. Doing nasal saline spray. Advised neti pot trial- if continued issues at 7-10 days consider aumgentin though recently had to use in December  Return in about 3 months (around 08/07/2015). Return precautions advised.   No orders of the defined types were placed in this encounter.    Meds ordered this encounter  Medications  . amLODipine (NORVASC) 2.5 MG tablet    Sig: Take 1 tablet (2.5 mg total) by mouth daily.    Dispense:  90 tablet    Refill:  3

## 2015-07-10 ENCOUNTER — Other Ambulatory Visit: Payer: Self-pay | Admitting: Family Medicine

## 2015-07-10 NOTE — Telephone Encounter (Signed)
Refill ok? 

## 2015-07-10 NOTE — Telephone Encounter (Signed)
Yes thanks, 4 refills ok

## 2015-08-07 ENCOUNTER — Ambulatory Visit (INDEPENDENT_AMBULATORY_CARE_PROVIDER_SITE_OTHER): Payer: 59 | Admitting: Family Medicine

## 2015-08-07 ENCOUNTER — Encounter: Payer: Self-pay | Admitting: Family Medicine

## 2015-08-07 VITALS — BP 138/88 | HR 70 | Temp 98.2°F | Ht 68.75 in | Wt 201.0 lb

## 2015-08-07 DIAGNOSIS — J4520 Mild intermittent asthma, uncomplicated: Secondary | ICD-10-CM

## 2015-08-07 DIAGNOSIS — I10 Essential (primary) hypertension: Secondary | ICD-10-CM | POA: Diagnosis not present

## 2015-08-07 DIAGNOSIS — E785 Hyperlipidemia, unspecified: Secondary | ICD-10-CM | POA: Diagnosis not present

## 2015-08-07 LAB — COMPREHENSIVE METABOLIC PANEL
ALK PHOS: 81 U/L (ref 39–117)
ALT: 39 U/L (ref 0–53)
AST: 27 U/L (ref 0–37)
Albumin: 4.3 g/dL (ref 3.5–5.2)
BILIRUBIN TOTAL: 0.6 mg/dL (ref 0.2–1.2)
BUN: 10 mg/dL (ref 6–23)
CALCIUM: 9.8 mg/dL (ref 8.4–10.5)
CO2: 36 meq/L — AB (ref 19–32)
Chloride: 98 mEq/L (ref 96–112)
Creatinine, Ser: 0.94 mg/dL (ref 0.40–1.50)
GFR: 103.94 mL/min (ref >60.00)
Glucose, Bld: 90 mg/dL (ref 70–99)
Potassium: 4.1 mEq/L (ref 3.5–5.1)
Sodium: 140 mEq/L (ref 135–145)
TOTAL PROTEIN: 6.9 g/dL (ref 6.0–8.3)

## 2015-08-07 LAB — CBC WITH DIFFERENTIAL/PLATELET
BASOS ABS: 0 10*3/uL (ref 0.0–0.1)
Basophils Relative: 0.8 % (ref 0.0–3.0)
EOS PCT: 2.4 % (ref 0.0–5.0)
Eosinophils Absolute: 0.1 10*3/uL (ref 0.0–0.7)
HEMATOCRIT: 50.3 % (ref 39.0–52.0)
Hemoglobin: 17.1 g/dL — ABNORMAL HIGH (ref 13.0–17.0)
LYMPHS PCT: 24 % (ref 12.0–46.0)
Lymphs Abs: 1.2 10*3/uL (ref 0.7–4.0)
MCHC: 34 g/dL (ref 30.0–36.0)
MCV: 93.3 fl (ref 78.0–100.0)
Monocytes Absolute: 1 10*3/uL (ref 0.1–1.0)
NEUTROS ABS: 2.6 10*3/uL (ref 1.4–7.7)
Neutrophils Relative %: 53.1 % (ref 43.0–77.0)
PLATELETS: 272 10*3/uL (ref 150.0–400.0)
RBC: 5.39 Mil/uL (ref 4.22–5.81)
RDW: 12.8 % (ref 11.5–15.5)
WBC: 5 10*3/uL (ref 4.0–10.5)

## 2015-08-07 LAB — LDL CHOLESTEROL, DIRECT: LDL DIRECT: 75 mg/dL

## 2015-08-07 NOTE — Assessment & Plan Note (Signed)
S: controlled on propranolol hctz 40-25mg  and amlodipine 2.5mg . Home pressures previously 50% at goal <140/90- today states has had 3 elevations at home to 142 but the rest of #s below 140.   BP Readings from Last 3 Encounters:  08/07/15 138/88  05/07/15 140/80  03/24/15 120/82  A/P:Continue current meds:  Doing much better with amlodipine addition- he feels better as well. Discussed risks of mobic.

## 2015-08-07 NOTE — Patient Instructions (Signed)
Labs before you leave  Blood pressure looks much better  Start back on your allegra! Let me know if not better in sinus congestion/pressure within 10 days  No changes in medicaitions

## 2015-08-07 NOTE — Progress Notes (Signed)
Pre visit review using our clinic review tool, if applicable. No additional management support is needed unless otherwise documented below in the visit note. 

## 2015-08-07 NOTE — Assessment & Plan Note (Signed)
S: well controlled on simvastatin 40mg . No myalgias.  Lab Results  Component Value Date   CHOL 147 04/24/2014   HDL 39.50 04/24/2014   LDLCALC 78 04/24/2014   LDLDIRECT 88.4 01/10/2012   TRIG 149.0 04/24/2014   CHOLHDL 4 04/24/2014   A/P: doing well, update labs including direct ldl

## 2015-08-07 NOTE — Progress Notes (Signed)
Subjective:  Mark [name] is a 65 y.o. year old very pleasant male patient who presents for/with See problem oriented charting ROS- no chest pain. Shortness of breath and wheeze at times impvoes on albuterol. no headaches or blurry vision.see any ROS included in HPI as well.   Past Medical History-  Patient Active Problem List   Diagnosis Date Noted  . Back pain 10/09/2013    Priority: Medium  . Anxiety state 09/01/2009    Priority: Medium  . Hyperlipidemia 06/11/2007    Priority: Medium  . Asthma 03/12/2007    Priority: Medium  . Essential hypertension 03/07/2007    Priority: Medium  . Migraine headache 03/07/2007    Priority: Medium  . PROSTATE CANCER, HX OF 03/07/2007    Priority: Medium  . Allergic rhinitis 11/22/2013    Priority: Low  . Overweight(278.02) 01/10/2012    Priority: Low  . Gastroparesis 05/01/2009    Priority: Low  . HIATAL HERNIA 04/28/2009    Priority: Low  . GERD 04/02/2009    Priority: Low    Medications- reviewed and updated Current Outpatient Prescriptions  Medication Sig Dispense Refill  . albuterol (PROVENTIL HFA;VENTOLIN HFA) 108 (90 BASE) MCG/ACT inhaler Inhale 2 puffs into the lungs every 6 (six) hours as needed for wheezing or shortness of breath. Reported on 05/07/2015    . amLODipine (NORVASC) 2.5 MG tablet Take 1 tablet (2.5 mg total) by mouth daily. 90 tablet 3  . azelastine (ASTELIN) 0.1 % nasal spray Place 2 sprays into both nostrils daily. Reported on 05/07/2015    . butalbital-acetaminophen-caffeine (FIORICET, ESGIC) 50-325-40 MG per tablet TAKE 1 TABLET BY MOUTH EVERY DAY AS NEEDED 30 tablet 5  . diazepam (VALIUM) 5 MG tablet Take 1 tablet (5 mg total) by mouth every 8 (eight) hours as needed. for anxiety 45 tablet 0  . fesoterodine (TOVIAZ) 4 MG TB24 tablet Take 4 mg by mouth daily.    . fexofenadine (ALLEGRA) 180 MG tablet Take 180 mg by mouth daily.    . fluticasone (FLONASE) 50 MCG/ACT nasal spray USE ONE SPRAY AS DIRECTED ONCE  DAILY 16 g 1  . meloxicam (MOBIC) 7.5 MG tablet Take 7.5 mg by mouth daily.    [name] Kitchen omeprazole (PRILOSEC) 40 MG capsule Take 40 mg by mouth 2 (two) times daily.    . ondansetron (ZOFRAN) 4 MG tablet Take 4 mg by mouth every 8 (eight) hours as needed for nausea or vomiting.    [name] Kitchen PARoxetine (PAXIL) 20 MG tablet Take 1 tablet (20 mg total) by mouth at bedtime. 90 tablet 1  . promethazine (PHENERGAN) 12.5 MG tablet Take 1 tablet (12.5 mg total) by mouth every 6 (six) hours as needed for nausea or vomiting. 90 tablet 3  . propranolol-hydrochlorothiazide (INDERIDE) 40-25 MG tablet Take 1 tablet by mouth 2 (two) times daily. 180 tablet 3  . simvastatin (ZOCOR) 40 MG tablet TAKE ONE TABLET AT BEDTIME 90 tablet 2  . traMADol (ULTRAM) 50 MG tablet TAKE 1 TABLET BY MOUTH EVERY DAY AS NEEDED 30 tablet 4   No current facility-administered medications for this visit.    Objective: BP 138/88 mmHg  Pulse 70  Temp(Src) 98.2 F (36.8 C) (Oral)  Ht 5' 8.75" (1.746 m)  Wt 201 lb (91.173 kg)  BMI 29.91 kg/m2  SpO2 95% Gen: NAD, resting comfortably Some nasal drainage- mainly clear CV: RRR no murmurs rubs or gallops Lungs: CTAB no crackles, wheeze, rhonchi Abdomen: soft/nontender/nondistended/normal bowel sounds. overweight Ext: no edema Skin: warm,  dry Neuro: grossly normal, moves all extremities  Assessment/Plan:  Essential hypertension S: controlled on propranolol hctz 40-25mg  and amlodipine 2.5mg . Home pressures previously 50% at goal <140/90- today states has had 3 elevations at home to 142 but the rest of #s below 140.   BP Readings from Last 3 Encounters:  08/07/15 138/88  05/07/15 140/80  03/24/15 120/82  A/P:Continue current meds:  Doing much better with amlodipine addition- he feels better as well. Discussed risks of mobic.   Asthma S: asthma is reasonably controlled with albuterol 1-2 x a week. Not on controller. Compliant with astelin- has been missing some allegra. He has noted sinus  congestion/pressure building up with mild sore throat A/P: asthma well controlle.d encouraged to improve compliance with allegra as can trigger his asthma- if sinus pressure/congestion not better in 10 days consider repeat course augmentin  Hyperlipidemia S: well controlled on simvastatin 40mg . No myalgias.  Lab Results  Component Value Date   CHOL 147 04/24/2014   HDL 39.50 04/24/2014   LDLCALC 78 04/24/2014   LDLDIRECT 88.4 01/10/2012   TRIG 149.0 04/24/2014   CHOLHDL 4 04/24/2014   A/P: doing well, update labs including direct ldl   Return in about 6 months (around 02/06/2016). Return precautions advised.   Orders Placed This Encounter  Procedures  . LDL cholesterol, direct    [place]  . CBC with Differential/Platelet  . Comprehensive metabolic panel    [place]   Garret Reddish, MD

## 2015-08-07 NOTE — Assessment & Plan Note (Signed)
S: asthma is reasonably controlled with albuterol 1-2 x a week. Not on controller. Compliant with astelin- has been missing some allegra. He has noted sinus congestion/pressure building up with mild sore throat A/P: asthma well controlle.d encouraged to improve compliance with allegra as can trigger his asthma- if sinus pressure/congestion not better in 10 days consider repeat course augmentin

## 2015-09-06 ENCOUNTER — Other Ambulatory Visit: Payer: Self-pay | Admitting: Family Medicine

## 2015-09-07 ENCOUNTER — Other Ambulatory Visit: Payer: Self-pay | Admitting: Emergency Medicine

## 2015-09-07 MED ORDER — BUTALBITAL-APAP-CAFFEINE 50-325-40 MG PO TABS: 1.0000 | ORAL_TABLET | Freq: Every day | ORAL | Status: DC | PRN

## 2015-11-05 ENCOUNTER — Other Ambulatory Visit: Payer: Self-pay | Admitting: Family Medicine

## 2015-11-10 ENCOUNTER — Telehealth: Payer: Self-pay | Admitting: Family Medicine

## 2015-11-10 NOTE — Telephone Encounter (Signed)
Pt is calling to let md know the amlodipine is keeping his bp under better  control around  126/82 and pulse 68. Pt said top number has not been in 150's

## 2015-11-10 NOTE — Telephone Encounter (Signed)
Great news, thanks for update- continue current regimen

## 2015-11-11 NOTE — Telephone Encounter (Signed)
Spoke with patient who said he will continue doing what he is doing

## 2015-12-17 ENCOUNTER — Other Ambulatory Visit: Payer: Self-pay | Admitting: Family Medicine

## 2015-12-22 NOTE — Telephone Encounter (Signed)
Pt need new Rx for diazepam,fexofenadine, paroxetine and  azelastine  Pharm:  [facility] [place]

## 2015-12-22 NOTE — Telephone Encounter (Signed)
Yes thanks 

## 2015-12-24 ENCOUNTER — Other Ambulatory Visit: Payer: Self-pay

## 2015-12-24 MED ORDER — AZELASTINE HCL 0.1 % NA SOLN: 2.0000 | Freq: Every day | NASAL | 2 refills | Status: DC

## 2015-12-24 MED ORDER — FEXOFENADINE HCL 180 MG PO TABS: 180.0000 mg | ORAL_TABLET | Freq: Every day | ORAL | 2 refills | Status: DC

## 2015-12-24 MED ORDER — PAROXETINE HCL 20 MG PO TABS: 20.0000 mg | ORAL_TABLET | Freq: Every day | ORAL | 1 refills | Status: DC

## 2015-12-31 ENCOUNTER — Ambulatory Visit (INDEPENDENT_AMBULATORY_CARE_PROVIDER_SITE_OTHER): Payer: 59

## 2015-12-31 DIAGNOSIS — Z23 Encounter for immunization: Secondary | ICD-10-CM | POA: Diagnosis not present

## 2016-02-05 ENCOUNTER — Ambulatory Visit (INDEPENDENT_AMBULATORY_CARE_PROVIDER_SITE_OTHER): Payer: 59 | Admitting: Family Medicine

## 2016-02-05 ENCOUNTER — Encounter: Payer: Self-pay | Admitting: Family Medicine

## 2016-02-05 DIAGNOSIS — G8929 Other chronic pain: Secondary | ICD-10-CM

## 2016-02-05 DIAGNOSIS — E785 Hyperlipidemia, unspecified: Secondary | ICD-10-CM

## 2016-02-05 DIAGNOSIS — J61 Pneumoconiosis due to asbestos and other mineral fibers: Secondary | ICD-10-CM | POA: Diagnosis not present

## 2016-02-05 DIAGNOSIS — I10 Essential (primary) hypertension: Secondary | ICD-10-CM | POA: Diagnosis not present

## 2016-02-05 DIAGNOSIS — M5442 Lumbago with sciatica, left side: Secondary | ICD-10-CM

## 2016-02-05 DIAGNOSIS — J4520 Mild intermittent asthma, uncomplicated: Secondary | ICD-10-CM

## 2016-02-05 MED ORDER — FLUTICASONE PROPIONATE 50 MCG/ACT NA SUSP: NASAL | 3 refills | Status: DC

## 2016-02-05 MED ORDER — BUTALBITAL-APAP-CAFFEINE 50-325-40 MG PO TABS: 1.0000 | ORAL_TABLET | Freq: Every day | ORAL | 3 refills | Status: DC | PRN

## 2016-02-05 MED ORDER — OMEPRAZOLE 40 MG PO CPDR: 40.0000 mg | DELAYED_RELEASE_CAPSULE | Freq: Every day | ORAL | 3 refills | Status: DC

## 2016-02-05 MED ORDER — PAROXETINE HCL 20 MG PO TABS: 20.0000 mg | ORAL_TABLET | Freq: Every day | ORAL | 1 refills | Status: DC

## 2016-02-05 MED ORDER — PROMETHAZINE HCL 12.5 MG PO TABS: 12.5000 mg | ORAL_TABLET | Freq: Four times a day (QID) | ORAL | 3 refills | Status: DC | PRN

## 2016-02-05 MED ORDER — PROPRANOLOL-HCTZ 40-25 MG PO TABS: 1.0000 | ORAL_TABLET | Freq: Two times a day (BID) | ORAL | 3 refills | Status: DC

## 2016-02-05 MED ORDER — FEXOFENADINE HCL 180 MG PO TABS: 180.0000 mg | ORAL_TABLET | Freq: Every day | ORAL | 3 refills | Status: DC

## 2016-02-05 MED ORDER — AZELASTINE HCL 0.1 % NA SOLN
2.0000 | Freq: Every day | NASAL | 3 refills | Status: DC
Start: 2016-02-05 — End: 2017-06-07

## 2016-02-05 MED ORDER — TRAMADOL HCL 50 MG PO TABS: 50.0000 mg | ORAL_TABLET | Freq: Every day | ORAL | 2 refills | Status: DC | PRN

## 2016-02-05 MED ORDER — DIAZEPAM 5 MG PO TABS: 5.0000 mg | ORAL_TABLET | Freq: Three times a day (TID) | ORAL | 1 refills | Status: DC | PRN

## 2016-02-05 MED ORDER — ALBUTEROL SULFATE HFA 108 (90 BASE) MCG/ACT IN AERS: 2.0000 | INHALATION_SPRAY | Freq: Four times a day (QID) | RESPIRATORY_TRACT | 3 refills | Status: DC | PRN

## 2016-02-05 MED ORDER — MONTELUKAST SODIUM 10 MG PO TABS: 10.0000 mg | ORAL_TABLET | Freq: Every day | ORAL | 3 refills | Status: DC

## 2016-02-05 MED ORDER — AMLODIPINE BESYLATE 2.5 MG PO TABS: 2.5000 mg | ORAL_TABLET | Freq: Every day | ORAL | 3 refills | Status: DC

## 2016-02-05 MED ORDER — SIMVASTATIN 20 MG PO TABS: 40.0000 mg | ORAL_TABLET | Freq: Every day | ORAL | 3 refills | Status: DC

## 2016-02-05 MED ORDER — ONDANSETRON HCL 4 MG PO TABS: 4.0000 mg | ORAL_TABLET | Freq: Three times a day (TID) | ORAL | 3 refills | Status: DC | PRN

## 2016-02-05 NOTE — Assessment & Plan Note (Addendum)
S: Class 2 (10-25% moderate impairment). Diagnosed by Elyn Aquas, MD- follows in 1 year. CT with mild changes only with [employer]- available on care everywhere A/P: wonder if this contributes to asthma issues- will continue albuterol. Thankful for regular follow up. Has card to cover him if illness related to asbestos exposure.

## 2016-02-05 NOTE — Progress Notes (Signed)
Pre visit review using our clinic review tool, if applicable. No additional management support is needed unless otherwise documented below in the visit note. 

## 2016-02-05 NOTE — Assessment & Plan Note (Signed)
S: well controlled on simvastatin 40mg  with LDL 75 6 months ago- he is breaking them half now. No myalgias.  Lab Results  Component Value Date   CHOL 147 04/24/2014   HDL 39.50 04/24/2014   LDLCALC 78 04/24/2014   LDLDIRECT 75.0 08/07/2015   TRIG 149.0 04/24/2014   CHOLHDL 4 04/24/2014   A/P: discussed with amlodipine- need to reduce dose to 20mg  of simvastatin- he is doing this already- cutting in half (will change to 20mg  pill. Update lipids next time at physical

## 2016-02-05 NOTE — Assessment & Plan Note (Signed)
S: asthma reaosnably controlled last visit with 1-2x a week albuterol without inhaler. Usually if on allegra and astelin and flonase controls symptoms for most part but this time of year is hard.  A/P: given poor control of allergies, will trial singulair in addition. Likely to help his asthma

## 2016-02-05 NOTE — Patient Instructions (Signed)
Great to see you   Refilled meds

## 2016-02-05 NOTE — Assessment & Plan Note (Signed)
S: chronic back pain- reasonable control on tramadol, mobic, valium. Gabapentin also takes 900mg  at bedtime with Dr. Rolena Infante.  A/P: continue current medication- fortunately only using tramadol once a day to keep him up and moving in daytime

## 2016-02-05 NOTE — Assessment & Plan Note (Signed)
S: compliant with propranolol hctz 40-25mg  BID and amlodipine 2.5 mg. Home #s <130 always at home. Was getting low level headaches before- that sinus related- now resolved.  BP Readings from Last 3 Encounters:  02/05/16 130/80  08/07/15 138/88  05/07/15 140/80  A/P:Continue current meds:  Doing very well

## 2016-02-05 NOTE — Progress Notes (Signed)
Subjective:  Mark [name] is a 65 y.o. year old very pleasant male patient who presents for/with See problem oriented charting ROS- No chest pain. Wheeze and shortness of breath at times- responds to albuterol. No headache or blurry vision.  .  Past Medical History-  Patient Active Problem List   Diagnosis Date Noted  . Asbestosis ([place]) 02/05/2016    Priority: High  . Back pain 10/09/2013    Priority: Medium  . Anxiety state 09/01/2009    Priority: Medium  . Gastroparesis 05/01/2009    Priority: Medium  . Hyperlipidemia 06/11/2007    Priority: Medium  . Asthma 03/12/2007    Priority: Medium  . Essential hypertension 03/07/2007    Priority: Medium  . Migraine headache 03/07/2007    Priority: Medium  . PROSTATE CANCER, HX OF 03/07/2007    Priority: Medium  . Allergic rhinitis 11/22/2013    Priority: Low  . Overweight(278.02) 01/10/2012    Priority: Low  . HIATAL HERNIA 04/28/2009    Priority: Low  . GERD 04/02/2009    Priority: Low    Medications- reviewed and updated Current Outpatient Prescriptions  Medication Sig Dispense Refill  . albuterol (PROVENTIL HFA;VENTOLIN HFA) 108 (90 Base) MCG/ACT inhaler Inhale 2 puffs into the lungs every 6 (six) hours as needed for wheezing or shortness of breath. Reported on 05/07/2015 3 Inhaler 3  . amLODipine (NORVASC) 2.5 MG tablet Take 1 tablet (2.5 mg total) by mouth daily. 90 tablet 3  . azelastine (ASTELIN) 0.1 % nasal spray Place 2 sprays into both nostrils daily. Reported on 05/07/2015 30 mL 3  . butalbital-acetaminophen-caffeine (FIORICET, ESGIC) 50-325-40 MG tablet Take 1 tablet by mouth daily as needed. 30 tablet 3  . diazepam (VALIUM) 5 MG tablet Take 1 tablet (5 mg total) by mouth every 8 (eight) hours as needed. for anxiety 45 tablet 1  . fesoterodine (TOVIAZ) 4 MG TB24 tablet Take 4 mg by mouth daily.    . fexofenadine (ALLEGRA) 180 MG tablet Take 1 tablet (180 mg total) by mouth daily. 90 tablet 3  . fluticasone  (FLONASE) 50 MCG/ACT nasal spray USE ONE SPRAY AS DIRECTED ONCE DAILY 48 g 3  . meloxicam (MOBIC) 7.5 MG tablet Take 7.5 mg by mouth daily.    . montelukast (SINGULAIR) 10 MG tablet Take 1 tablet (10 mg total) by mouth at bedtime. 90 tablet 3  . omeprazole (PRILOSEC) 40 MG capsule Take 1 capsule (40 mg total) by mouth daily. 90 capsule 3  . ondansetron (ZOFRAN) 4 MG tablet Take 1 tablet (4 mg total) by mouth every 8 (eight) hours as needed for nausea or vomiting. 20 tablet 3  . PARoxetine (PAXIL) 20 MG tablet Take 1 tablet (20 mg total) by mouth at bedtime. 90 tablet 1  . promethazine (PHENERGAN) 12.5 MG tablet Take 1 tablet (12.5 mg total) by mouth every 6 (six) hours as needed for nausea or vomiting. 90 tablet 3  . propranolol-hydrochlorothiazide (INDERIDE) 40-25 MG tablet Take 1 tablet by mouth 2 (two) times daily. 180 tablet 3  . simvastatin (ZOCOR) 20 MG tablet Take 2 tablets (40 mg total) by mouth at bedtime. 90 tablet 3  . traMADol (ULTRAM) 50 MG tablet Take 1 tablet (50 mg total) by mouth daily as needed. 30 tablet 2   No current facility-administered medications for this visit.     Objective: BP 130/80 (BP Location: Left Arm, Patient Position: Sitting, Cuff Size: Large)   Pulse 66   Temp 98 F (36.7 C) (  Oral)   Ht 5' 8.75" (1.746 m)   Wt 201 lb 6.4 oz (91.4 kg)   SpO2 96%   BMI 29.96 kg/m  Gen: NAD, resting comfortably CV: RRR no murmurs rubs or gallops Lungs: CTAB no crackles, wheeze, rhonchi Ext: no edema  Assessment/Plan:  Asbestosis ([facility]) S: Class 2 (10-25% moderate impairment). Diagnosed by Elyn Aquas, MD- follows in 1 year. CT with mild changes only with [employer]- available on care everywhere A/P: wonder if this contributes to asthma issues- will continue albuterol. Thankful for regular follow up. Has card to cover him if illness related to asbestos exposure.    Essential hypertension S: compliant with propranolol hctz 40-25mg  BID and amlodipine 2.5 mg. Home #s  <130 always at home. Was getting low level headaches before- that sinus related- now resolved.  BP Readings from Last 3 Encounters:  02/05/16 130/80  08/07/15 138/88  05/07/15 140/80  A/P:Continue current meds:  Doing very well   Asthma S: asthma reaosnably controlled last visit with 1-2x a week albuterol without inhaler. Usually if on allegra and astelin and flonase controls symptoms for most part but this time of year is hard.  A/P: given poor control of allergies, will trial singulair in addition. Likely to help his asthma  Hyperlipidemia S: well controlled on simvastatin 40mg  with LDL 75 6 months ago- he is breaking them half now. No myalgias.  Lab Results  Component Value Date   CHOL 147 04/24/2014   HDL 39.50 04/24/2014   LDLCALC 78 04/24/2014   LDLDIRECT 75.0 08/07/2015   TRIG 149.0 04/24/2014   CHOLHDL 4 04/24/2014   A/P: discussed with amlodipine- need to reduce dose to 20mg  of simvastatin- he is doing this already- cutting in half (will change to 20mg  pill. Update lipids next time at physical  Back pain S: chronic back pain- reasonable control on tramadol, mobic, valium. Gabapentin also takes 900mg  at bedtime with Dr. Rolena Infante.  A/P: continue current medication- fortunately only using tramadol once a day to keep him up and moving in daytime   Return in about 4 months (around 06/05/2016) for physical.  Meds ordered this encounter  Medications  . albuterol (PROVENTIL HFA;VENTOLIN HFA) 108 (90 Base) MCG/ACT inhaler    Sig: Inhale 2 puffs into the lungs every 6 (six) hours as needed for wheezing or shortness of breath. Reported on 05/07/2015    Dispense:  3 Inhaler    Refill:  3  . amLODipine (NORVASC) 2.5 MG tablet    Sig: Take 1 tablet (2.5 mg total) by mouth daily.    Dispense:  90 tablet    Refill:  3  . azelastine (ASTELIN) 0.1 % nasal spray    Sig: Place 2 sprays into both nostrils daily. Reported on 05/07/2015    Dispense:  30 mL    Refill:  3  .  butalbital-acetaminophen-caffeine (FIORICET, ESGIC) 50-325-40 MG tablet    Sig: Take 1 tablet by mouth daily as needed.    Dispense:  30 tablet    Refill:  3  . diazepam (VALIUM) 5 MG tablet    Sig: Take 1 tablet (5 mg total) by mouth every 8 (eight) hours as needed. for anxiety    Dispense:  45 tablet    Refill:  1  . fexofenadine (ALLEGRA) 180 MG tablet    Sig: Take 1 tablet (180 mg total) by mouth daily.    Dispense:  90 tablet    Refill:  3  . fluticasone (FLONASE) 50 MCG/ACT nasal spray  Sig: USE ONE SPRAY AS DIRECTED ONCE DAILY    Dispense:  48 g    Refill:  3  . omeprazole (PRILOSEC) 40 MG capsule    Sig: Take 1 capsule (40 mg total) by mouth daily.    Dispense:  90 capsule    Refill:  3  . ondansetron (ZOFRAN) 4 MG tablet    Sig: Take 1 tablet (4 mg total) by mouth every 8 (eight) hours as needed for nausea or vomiting.    Dispense:  20 tablet    Refill:  3  . PARoxetine (PAXIL) 20 MG tablet    Sig: Take 1 tablet (20 mg total) by mouth at bedtime.    Dispense:  90 tablet    Refill:  1  . promethazine (PHENERGAN) 12.5 MG tablet    Sig: Take 1 tablet (12.5 mg total) by mouth every 6 (six) hours as needed for nausea or vomiting.    Dispense:  90 tablet    Refill:  3  . propranolol-hydrochlorothiazide (INDERIDE) 40-25 MG tablet    Sig: Take 1 tablet by mouth 2 (two) times daily.    Dispense:  180 tablet    Refill:  3  . simvastatin (ZOCOR) 20 MG tablet    Sig: Take 2 tablets (40 mg total) by mouth at bedtime.    Dispense:  90 tablet    Refill:  3  . traMADol (ULTRAM) 50 MG tablet    Sig: Take 1 tablet (50 mg total) by mouth daily as needed.    Dispense:  30 tablet    Refill:  2  . montelukast (SINGULAIR) 10 MG tablet    Sig: Take 1 tablet (10 mg total) by mouth at bedtime.    Dispense:  90 tablet    Refill:  3  reduce prilosec to once a day- with prior GI was on BID- hopeful can tolerate lower dose  Return precautions advised.  Garret Reddish,  MD

## 2016-03-28 ENCOUNTER — Other Ambulatory Visit: Payer: Self-pay | Admitting: Family Medicine

## 2016-06-01 ENCOUNTER — Other Ambulatory Visit: Payer: Self-pay

## 2016-06-01 DIAGNOSIS — Z8546 Personal history of malignant neoplasm of prostate: Secondary | ICD-10-CM

## 2016-06-01 DIAGNOSIS — Z0000 Encounter for general adult medical examination without abnormal findings: Secondary | ICD-10-CM

## 2016-06-01 DIAGNOSIS — E785 Hyperlipidemia, unspecified: Secondary | ICD-10-CM

## 2016-06-07 ENCOUNTER — Ambulatory Visit (INDEPENDENT_AMBULATORY_CARE_PROVIDER_SITE_OTHER): Payer: Medicare HMO | Admitting: Family Medicine

## 2016-06-07 ENCOUNTER — Encounter: Payer: Self-pay | Admitting: Family Medicine

## 2016-06-07 VITALS — BP 130/82 | HR 68 | Temp 97.9°F | Ht 70.25 in | Wt 200.2 lb

## 2016-06-07 DIAGNOSIS — J61 Pneumoconiosis due to asbestos and other mineral fibers: Secondary | ICD-10-CM | POA: Diagnosis not present

## 2016-06-07 DIAGNOSIS — Z0000 Encounter for general adult medical examination without abnormal findings: Secondary | ICD-10-CM | POA: Diagnosis not present

## 2016-06-07 DIAGNOSIS — E785 Hyperlipidemia, unspecified: Secondary | ICD-10-CM | POA: Diagnosis not present

## 2016-06-07 DIAGNOSIS — Z8546 Personal history of malignant neoplasm of prostate: Secondary | ICD-10-CM | POA: Diagnosis not present

## 2016-06-07 LAB — CBC
HEMATOCRIT: 49.6 % (ref 39.0–52.0)
Hemoglobin: 16.9 g/dL (ref 13.0–17.0)
MCHC: 34 g/dL (ref 30.0–36.0)
MCV: 95.1 fl (ref 78.0–100.0)
Platelets: 274 10*3/uL (ref 150.0–400.0)
RBC: 5.21 Mil/uL (ref 4.22–5.81)
RDW: 12.9 % (ref 11.5–15.5)
WBC: 4.3 10*3/uL (ref 4.0–10.5)

## 2016-06-07 LAB — PSA: PSA: 0 ng/mL — AB (ref 0.10–4.00)

## 2016-06-07 LAB — COMPREHENSIVE METABOLIC PANEL
ALBUMIN: 4.4 g/dL (ref 3.5–5.2)
ALK PHOS: 77 U/L (ref 39–117)
ALT: 28 U/L (ref 0–53)
AST: 21 U/L (ref 0–37)
BUN: 15 mg/dL (ref 6–23)
CALCIUM: 9.6 mg/dL (ref 8.4–10.5)
CHLORIDE: 97 meq/L (ref 96–112)
CO2: 34 mEq/L — ABNORMAL HIGH (ref 19–32)
Creatinine, Ser: 0.88 mg/dL (ref 0.40–1.50)
GFR: 111.87 mL/min (ref >60.00)
Glucose, Bld: 89 mg/dL (ref 70–99)
POTASSIUM: 3.8 meq/L (ref 3.5–5.1)
Sodium: 139 mEq/L (ref 135–145)
TOTAL PROTEIN: 6.8 g/dL (ref 6.0–8.3)
Total Bilirubin: 0.6 mg/dL (ref 0.2–1.2)

## 2016-06-07 LAB — LIPID PANEL
CHOLESTEROL: 167 mg/dL (ref 0–200)
HDL: 43.2 mg/dL (ref >39.00)
LDL Cholesterol: 86 mg/dL (ref 0–99)
NONHDL: 124.24
Total CHOL/HDL Ratio: 4
Triglycerides: 189 mg/dL — ABNORMAL HIGH (ref 0.0–149.0)
VLDL: 37.8 mg/dL (ref 0.0–40.0)

## 2016-06-07 NOTE — Assessment & Plan Note (Signed)
Asbestosis- follows with dr. Pearlie Oyster yearly. Class II

## 2016-06-07 NOTE — Addendum Note (Signed)
Addended by: Elmer Picker on: 06/07/2016 09:29 AM   Modules accepted: Orders

## 2016-06-07 NOTE — Progress Notes (Signed)
Phone: [phone]  Subjective:  Patient presents today for their annual physical. Chief complaint-noted.   See problem oriented charting- ROS- full  review of systems was completed and negative including No chest pain or shortness of breath. No headache or blurry vision. Except for long term issues of low back pain, anxiety/stress related to bowel movements. Nausea chronic.   The following were reviewed and entered/updated in epic: Past Medical History:  Diagnosis Date  . Anxiety state, unspecified 09/01/2009  . ASTHMA 03/12/2007  . Cancer [facility])    prostate  . Gastroparesis 05/01/2009  . GERD 04/02/2009  . Headache(784.0) 03/07/2007  . HIATAL HERNIA 04/28/2009  . Hiatal hernia   . HYPERLIPIDEMIA 06/11/2007  . HYPERTENSION 03/07/2007  . Pneumonia    hx of  . Polyp of colon, hyperplastic   . PROSTATE CANCER, HX OF 03/07/2007  . TRANSAMINASES, SERUM, ELEVATED 04/28/2009  . Tubular adenoma of colon   . Ulcer [facility])    as teenager   Patient Active Problem List   Diagnosis Date Noted  . Asbestosis ([place]) 02/05/2016    Priority: High  . Back pain 10/09/2013    Priority: Medium  . Anxiety state 09/01/2009    Priority: Medium  . Gastroparesis 05/01/2009    Priority: Medium  . Hyperlipidemia 06/11/2007    Priority: Medium  . Asthma 03/12/2007    Priority: Medium  . Essential hypertension 03/07/2007    Priority: Medium  . Migraine headache 03/07/2007    Priority: Medium  . PROSTATE CANCER, HX OF 03/07/2007    Priority: Medium  . Allergic rhinitis 11/22/2013    Priority: Low  . Overweight(278.02) 01/10/2012    Priority: Low  . HIATAL HERNIA 04/28/2009    Priority: Low  . GERD 04/02/2009    Priority: Low   Past Surgical History:  Procedure Laterality Date  . APPENDECTOMY    . BACK SURGERY  05/2011  . COLONOSCOPY    . LUMBAR LAMINECTOMY/DECOMPRESSION MICRODISCECTOMY N/A 10/09/2013   Procedure: L4-5 DECOMPRESSION/FARAMINOTOMY REVISION ;  Surgeon: Melina Schools, MD;  Location: [facility];   Service: Orthopedics;  Laterality: N/A;  . PROSTATECTOMY  07/10/06  . ROTATOR CUFF REPAIR     9/09 on right  . TRANSFORAMINAL LUMBAR INTERBODY FUSION (TLIF) WITH PEDICLE SCREW FIXATION 1 LEVEL N/A 10/09/2013   Procedure: TLIF L5-S1;  Surgeon: Melina Schools, MD;  Location: [place];  Service: Orthopedics;  Laterality: N/A;    Family History  Problem Relation Age of Onset  . Pancreatic cancer Mother 72  . Hypertension Father   . Hypertension Brother     x 2  . Colon cancer Neg Hx   . Rectal cancer Neg Hx   . Stomach cancer Neg Hx     Medications- reviewed and updated Current Outpatient Prescriptions  Medication Sig Dispense Refill  . albuterol (PROVENTIL HFA;VENTOLIN HFA) 108 (90 Base) MCG/ACT inhaler Inhale 2 puffs into the lungs every 6 (six) hours as needed for wheezing or shortness of breath. Reported on 05/07/2015 3 Inhaler 3  . amLODipine (NORVASC) 2.5 MG tablet Take 1 tablet (2.5 mg total) by mouth daily. 90 tablet 3  . azelastine (ASTELIN) 0.1 % nasal spray Place 2 sprays into both nostrils daily. Reported on 05/07/2015 30 mL 3  . butalbital-acetaminophen-caffeine (FIORICET, ESGIC) 50-325-40 MG tablet Take 1 tablet by mouth daily as needed. 30 tablet 3  . diazepam (VALIUM) 5 MG tablet Take 1 tablet (5 mg total) by mouth every 8 (eight) hours as needed. for anxiety 45 tablet 1  .  fexofenadine (ALLEGRA) 180 MG tablet Take 1 tablet (180 mg total) by mouth daily. 90 tablet 3  . fluticasone (FLONASE) 50 MCG/ACT nasal spray USE ONE SPRAY AS DIRECTED ONCE DAILY 48 g 3  . meloxicam (MOBIC) 7.5 MG tablet Take 7.5 mg by mouth daily.    . montelukast (SINGULAIR) 10 MG tablet Take 1 tablet (10 mg total) by mouth at bedtime. 90 tablet 3  . omeprazole (PRILOSEC) 40 MG capsule Take 1 capsule (40 mg total) by mouth daily. 90 capsule 3  . ondansetron (ZOFRAN) 4 MG tablet Take 1 tablet (4 mg total) by mouth every 8 (eight) hours as needed for nausea or vomiting. 20 tablet 3  . PARoxetine (PAXIL) 20 MG  tablet Take 1 tablet (20 mg total) by mouth at bedtime. 90 tablet 1  . promethazine (PHENERGAN) 12.5 MG tablet Take 1 tablet (12.5 mg total) by mouth every 6 (six) hours as needed for nausea or vomiting. 90 tablet 3  . propranolol-hydrochlorothiazide (INDERIDE) 40-25 MG tablet Take 1 tablet by mouth 2 (two) times daily. 180 tablet 3  . simvastatin (ZOCOR) 20 MG tablet Take 2 tablets (40 mg total) by mouth at bedtime. 90 tablet 3  . traMADol (ULTRAM) 50 MG tablet Take 1 tablet (50 mg total) by mouth daily as needed. 30 tablet 2  . VESICARE 5 MG tablet Take 1 tablet by mouth daily.     No current facility-administered medications for this visit.     Allergies-reviewed and updated Allergies  Allergen Reactions  . Propoxyphene Hcl Nausea And Vomiting         Social History   Social History  . Marital status: Married    Spouse name: N/A  . Number of children: 4  . Years of education: N/A   Occupational History  . Disabled    Social History Main Topics  . Smoking status: Former Smoker    Packs/day: 0.50    Years: 2.00    Types: Cigarettes    Quit date: 04/09/1983  . Smokeless tobacco: Never Used  . Alcohol use No  . Drug use: No  . Sexual activity: Yes   Other Topics Concern  . None   Social History Narrative   Married 1974. Wife-Helen. 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 Oropharynx normal Neck: no thyromegaly CV: RRR no murmurs rubs or gallops Lungs:  CTAB no crackles, wheeze, rhonchi Abdomen: soft/nontender/nondistended/normal bowel sounds. No rebound or guarding.  Ext: no edema Skin: warm, dry Neuro: grossly normal, moves all extremities, PERRLA Rectal declined- no prostate  Assessment/Plan:  66 y.o. male presenting for annual physical.  Health Maintenance counseling: 1. Anticipatory guidance: Patient counseled regarding regular dental exams q6 months, eye exams yearly, wearing seatbelts.  2. Risk factor reduction:  Advised patient of need for regular exercise and diet rich and fruits and  vegetables to reduce risk of heart attack and stroke. Exercise- 4-5x a week. Diet-reasonable. Discussed dropping maybe 10 lbs (lay off some sweets) Wt Readings from Last 3 Encounters:  06/07/16 200 lb 3.2 oz (90.8 kg)  02/05/16 201 lb 6.4 oz (91.4 kg)  08/07/15 201 lb (91.2 kg)  3. Immunizations/screenings/ancillary studies Immunization History  Administered Date(s) Administered  . H1N1 03/21/2008  . Influenza Split 12/31/2010, 11/22/2011, 11/28/2012  . Influenza Whole 12/12/2007, 12/11/2008  . Influenza,inj,Quad PF,36+ Mos 12/12/2014, 12/31/2015  . Td 03/01/1995, 06/17/2008  . Tdap 06/11/2007  . Zoster 01/10/2012  4. Prostate cancer history- see below  5. Colon cancer screening - 2014 with 5 year repeat  Status of chronic or acute concerns   Prior future labs ordered- will do all but urine as urology will get this.   asthma and uses albuterol mainly with exercise or with triggers being out in pollen. Also takes singulair and flonase and astelin for allergies which helps  Prostate cancer hx-  following with urology after robitc surgery 2007. With Dr. Alinda Money. Has upcoming visit. Dr. Alinda Money put him on vesicare  Migraines- frequency likely helped by amlodipien and propranolol. Also uses fiorcet approximately 1-2x a week but varies.   Back pain- uses tramadol sparingly. Also uses mobic. Prior Dr. Rolena Infante.   Anxiety- controlled on paxil for  most part. Along with valium prior through GI for stomach issues. Promethazine or zofran used for nausea. Uses phenergan only if not going to have to be out of the house  HLD-  on simvastatin 20mg  (reduced due to amlodipine)- needs updated labs but reasonable control last check Lab Results  Component Value Date   CHOL 147 04/24/2014   HDL 39.50 04/24/2014   LDLCALC 78 04/24/2014   LDLDIRECT 75.0 08/07/2015   TRIG 149.0 04/24/2014   CHOLHDL 4 04/24/2014   S: well controlled on inderide 40-25mg  and amlodipine 2.5mg .  BP Readings from Last 3 Encounters:  06/07/16 130/82  02/05/16 130/80  08/07/15 138/88  A/P:Continue current meds:  Much better control  Asbestosis ([place]) Asbestosis- follows with dr. Pearlie Oyster yearly. Class II  Return in about 6 months (around 12/07/2016) for follow up- or sooner if needed.  Meds ordered this encounter  Medications  . VESICARE 5 MG tablet    Sig: Take 1 tablet by mouth daily.   Return precautions advised.  Garret Reddish, MD

## 2016-06-07 NOTE — Patient Instructions (Addendum)
Please stop by lab before you go We will have them do all bloodwork but not the urine. When we send you results- I will try to mention that we are sending to Dr. Alinda Money- if I do not then reach back out to [place] for [place] to send this  Have them send me your next asbestos report  Cut down on sweets and work towards perhaps 190 on our scales in next 6-12 months

## 2016-06-07 NOTE — Progress Notes (Signed)
Pre visit review using our clinic review tool, if applicable. No additional management support is needed unless otherwise documented below in the visit note. 

## 2016-06-08 ENCOUNTER — Telehealth: Payer: Self-pay | Admitting: Family Medicine

## 2016-06-08 NOTE — Telephone Encounter (Signed)
Pt is calling jamie back needing blood work results

## 2016-06-09 NOTE — Telephone Encounter (Signed)
Mark [name] pt returned your call. °

## 2016-06-09 NOTE — Telephone Encounter (Signed)
Called patient and left a voicemail message asking for a return phone call. 

## 2016-06-09 NOTE — Telephone Encounter (Signed)
Spoke with patient who verbalized understanding of lab results 

## 2016-06-15 ENCOUNTER — Ambulatory Visit (INDEPENDENT_AMBULATORY_CARE_PROVIDER_SITE_OTHER): Payer: Medicare HMO | Admitting: Gastroenterology

## 2016-06-15 ENCOUNTER — Encounter: Payer: Self-pay | Admitting: Gastroenterology

## 2016-06-15 VITALS — BP 120/80 | HR 68 | Ht 69.25 in | Wt 199.4 lb

## 2016-06-15 DIAGNOSIS — R140 Abdominal distension (gaseous): Secondary | ICD-10-CM | POA: Diagnosis not present

## 2016-06-15 DIAGNOSIS — K219 Gastro-esophageal reflux disease without esophagitis: Secondary | ICD-10-CM | POA: Diagnosis not present

## 2016-06-15 DIAGNOSIS — R1013 Epigastric pain: Secondary | ICD-10-CM | POA: Diagnosis not present

## 2016-06-15 DIAGNOSIS — G43A0 Cyclical vomiting, not intractable: Secondary | ICD-10-CM

## 2016-06-15 MED ORDER — ONDANSETRON HCL 4 MG PO TABS: 4.0000 mg | ORAL_TABLET | Freq: Three times a day (TID) | ORAL | 3 refills | Status: DC | PRN

## 2016-06-15 MED ORDER — PROMETHAZINE HCL 12.5 MG PO TABS: 12.5000 mg | ORAL_TABLET | Freq: Four times a day (QID) | ORAL | 3 refills | Status: DC | PRN

## 2016-06-15 MED ORDER — OMEPRAZOLE 40 MG PO CPDR: 40.0000 mg | DELAYED_RELEASE_CAPSULE | Freq: Two times a day (BID) | ORAL | 3 refills | Status: DC

## 2016-06-15 NOTE — Progress Notes (Signed)
Mark [name]    [id number]    1950/03/20  Primary Care Physician:Stephen Yong Channel, MD  Referring Physician: Marin Olp, MD [address], [place] [address]  Chief complaint:  Intermittent nausea and vomiting  HPI: 66 year old African-American male with history of gastroparesis(slightly delayed gastric emptying scan with 36% retention 2 hour study) here for follow-up visit. He was last seen in office 03/24/2015. He is to have intermittent episodes of nausea and vomiting about one to 2 times a month. He has identified specific triggers, greasy food, seafood, sweet potato. Apparently he has no issue with eating raw vegetables, salads or steak. He is taking phenergan or zofran as needed for symptom control. He was on domperidone previously but was discontinued. Last colonoscopy in May 2014 with removal of tubular adenoma EGD February 2011 with esophagitis and gastritis     Outpatient Encounter Prescriptions as of 06/15/2016  Medication Sig  . albuterol (PROVENTIL HFA;VENTOLIN HFA) 108 (90 Base) MCG/ACT inhaler Inhale 2 puffs into the lungs every 6 (six) hours as needed for wheezing or shortness of breath. Reported on 05/07/2015  . amLODipine (NORVASC) 2.5 MG tablet Take 1 tablet (2.5 mg total) by mouth daily.  [name] Kitchen azelastine (ASTELIN) 0.1 % nasal spray Place 2 sprays into both nostrils daily. Reported on 05/07/2015  . butalbital-acetaminophen-caffeine (FIORICET, ESGIC) 50-325-40 MG tablet Take 1 tablet by mouth daily as needed.  . diazepam (VALIUM) 5 MG tablet Take 1 tablet (5 mg total) by mouth every 8 (eight) hours as needed. for anxiety  . fexofenadine (ALLEGRA) 180 MG tablet Take 1 tablet (180 mg total) by mouth daily.  . fluticasone (FLONASE) 50 MCG/ACT nasal spray USE ONE SPRAY AS DIRECTED ONCE DAILY  . meloxicam (MOBIC) 7.5 MG tablet Take 7.5 mg by mouth daily.  . montelukast (SINGULAIR) 10 MG tablet Take 1 tablet (10 mg total) by mouth at bedtime.  [name] Kitchen  omeprazole (PRILOSEC) 40 MG capsule Take 1 capsule (40 mg total) by mouth daily.  . ondansetron (ZOFRAN) 4 MG tablet Take 1 tablet (4 mg total) by mouth every 8 (eight) hours as needed for nausea or vomiting.  [name] Kitchen PARoxetine (PAXIL) 20 MG tablet Take 1 tablet (20 mg total) by mouth at bedtime.  . promethazine (PHENERGAN) 12.5 MG tablet Take 1 tablet (12.5 mg total) by mouth every 6 (six) hours as needed for nausea or vomiting.  . propranolol-hydrochlorothiazide (INDERIDE) 40-25 MG tablet Take 1 tablet by mouth 2 (two) times daily.  . simvastatin (ZOCOR) 20 MG tablet Take 2 tablets (40 mg total) by mouth at bedtime.  . traMADol (ULTRAM) 50 MG tablet Take 1 tablet (50 mg total) by mouth daily as needed.  . VESICARE 5 MG tablet Take 1 tablet by mouth daily.   No facility-administered encounter medications on file as of 06/15/2016.     Allergies as of 06/15/2016 - Review Complete 06/15/2016  Allergen Reaction Noted  . Propoxyphene hcl Nausea And Vomiting 03/27/2006    Past Medical History:  Diagnosis Date  . Anxiety state, unspecified 09/01/2009  . ASTHMA 03/12/2007  . Cancer [facility])    prostate  . Gastroparesis 05/01/2009  . GERD 04/02/2009  . Headache(784.0) 03/07/2007  . HIATAL HERNIA 04/28/2009  . Hiatal hernia   . HYPERLIPIDEMIA 06/11/2007  . HYPERTENSION 03/07/2007  . Pneumonia    hx of  . Polyp of colon, hyperplastic   . PROSTATE CANCER, HX OF 03/07/2007  . TRANSAMINASES, SERUM, ELEVATED 04/28/2009  .  Tubular adenoma of colon   . Ulcer    as teenager    Past Surgical History:  Procedure Laterality Date  . APPENDECTOMY    . BACK SURGERY  05/2011  . COLONOSCOPY    . LUMBAR LAMINECTOMY/DECOMPRESSION MICRODISCECTOMY N/A 10/09/2013   Procedure: L4-5 DECOMPRESSION/FARAMINOTOMY REVISION ;  Surgeon: Melina Schools, MD;  Location: [facility];  Service: Orthopedics;  Laterality: N/A;  . PROSTATECTOMY  07/10/06  . ROTATOR CUFF REPAIR     9/09 on right  . TRANSFORAMINAL LUMBAR INTERBODY FUSION (TLIF) WITH  PEDICLE SCREW FIXATION 1 LEVEL N/A 10/09/2013   Procedure: TLIF L5-S1;  Surgeon: Melina Schools, MD;  Location: [facility];  Service: Orthopedics;  Laterality: N/A;    Family History  Problem Relation Age of Onset  . Pancreatic cancer Mother 70  . Hypertension Father   . Hypertension Brother     x 2  . Colon cancer Neg Hx   . Rectal cancer Neg Hx   . Stomach cancer Neg Hx     Social History   Social History  . Marital status: Married    Spouse name: N/A  . Number of children: 4  . Years of education: N/A   Occupational History  . Disabled    Social History Main Topics  . Smoking status: Former Smoker    Packs/day: 0.50    Years: 2.00    Types: Cigarettes    Quit date: 04/09/1983  . Smokeless tobacco: Never Used  . Alcohol use No  . Drug use: No  . Sexual activity: Yes   Other Topics Concern  . Not on file   Social History Narrative   Married 1974. Wife-Helen. 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       Working at [employer]: hunting, boat (off for 1 year in 2015)      Review of systems: Review of Systems  Constitutional: Negative for fever and chills.  HENT: Negative.   Eyes: Negative for blurred vision.  Respiratory: Negative for cough, shortness of breath and wheezing.   Cardiovascular: Negative for chest pain and palpitations.  Gastrointestinal: as per HPI Genitourinary: Negative for dysuria, urgency, frequency and hematuria.  Musculoskeletal: Positive for myalgias, back pain and joint pain.  Skin: Negative for itching and rash.  Neurological: Negative for dizziness, tremors, focal weakness, seizures and loss of consciousness.  positive for headache Endo/Heme/Allergies: Positive for seasonal allergies.  Psychiatric/Behavioral: Negative for depression, suicidal ideas and hallucinations.  All other  systems reviewed and are negative.   Physical Exam: Vitals:   06/15/16 1007  BP: 120/80  Pulse: 68   Body mass index is 29.23 kg/m. Gen:      No acute distress HEENT:  EOMI, sclera anicteric Neck:     No masses; no thyromegaly Lungs:    Clear to auscultation bilaterally; normal respiratory effort CV:         Regular rate and rhythm; no murmurs Abd:      + bowel sounds; soft, non-tender; no palpable masses, no distension Ext:    No edema; adequate peripheral perfusion Skin:      Warm and dry; no rash Neuro: alert and oriented x 3 Psych: normal mood and affect  Data Reviewed:  Reviewed labs, radiology imaging, old records and pertinent past GI work up  Assessment and Plan/Recommendations:  66 year old male with history of back injury status post surgery with chronic back pain, chronic intermittent nausea and vomiting is here for follow-up  Patient had slight delay in gastric emptying on 2 hour study done in 2011; 2 hour gastric emptying study is normal longer considered standard His symptoms are not typical of gastroparesis We will obtain a 4 hour gastric emptying scan to confirm the diagnosis  Advise patient to continue to identify specific food triggers and avoid them Gastroesophageal reflux could be playing a role Omeprazole 40 mg daily Discussed antireflux measures  Excessive bloating, epigastric discomfort and dyspepsia: FD Gard 1-2 capsules 3 times a day as needed Phazyme 1 capsule 3 times a day as needed If continues to have persistent symptoms we'll consider EGD for further evaluation  Colorectal cancer screening: Higher risk with history of tubular adenoma. Due for surveillance colonoscopy in May 2019  Return in 6 months   K. Denzil Magnuson , MD [phone] Mon-Fri 8a-5p [phone] after 5p, weekends, holidays  CC: Marin Olp, MD

## 2016-06-15 NOTE — Patient Instructions (Signed)
You have been scheduled for a gastric emptying scan at [facility] on 06/22/2016 at 9:30am. Please arrive at least 15 minutes prior to your appointment for registration. Please make certain not to have anything to eat or drink after midnight the night before your test. Hold all stomach medications (ex: Zofran, phenergan, Reglan) 48 hours prior to your test. If you need to reschedule your appointment, please contact radiology scheduling at [phone]. _____________________________________________________________________ A gastric-emptying study measures how long it takes for food to move through your stomach. There are several ways to measure stomach emptying. In the most common test, you eat food that contains a small amount of radioactive material. A scanner that detects the movement of the radioactive material is placed over your abdomen to monitor the rate at which food leaves your stomach. This test normally takes about 4 hours to complete. _____________________________________________________________________  We will refill Zofran and phenergan  We will send omeprazole 40 mg to your pharmacy  Use FD Donald Prose over the counter 1 three times a day as needed  Use Phazyme over the counter three times a day as needed

## 2016-06-22 ENCOUNTER — Ambulatory Visit (HOSPITAL_COMMUNITY)
Admission: RE | Admit: 2016-06-22 | Discharge: 2016-06-22 | Disposition: A | Discharge status: Home / Self Care | Payer: Medicare HMO | Source: Ambulatory Visit | Attending: Gastroenterology | Admitting: Gastroenterology

## 2016-06-22 DIAGNOSIS — G43A0 Cyclical vomiting, not intractable: Secondary | ICD-10-CM | POA: Diagnosis not present

## 2016-06-22 DIAGNOSIS — K219 Gastro-esophageal reflux disease without esophagitis: Secondary | ICD-10-CM | POA: Diagnosis not present

## 2016-06-22 DIAGNOSIS — R1013 Epigastric pain: Secondary | ICD-10-CM

## 2016-06-22 DIAGNOSIS — R110 Nausea: Secondary | ICD-10-CM | POA: Diagnosis not present

## 2016-06-22 MED ORDER — TECHNETIUM TC 99M SULFUR COLLOID
2.0000 | Freq: Once | INTRAVENOUS | Status: AC
Start: 2016-06-22 — End: 2016-06-22
  Administered 2016-06-22: 2 via ORAL

## 2016-09-08 ENCOUNTER — Ambulatory Visit (INDEPENDENT_AMBULATORY_CARE_PROVIDER_SITE_OTHER): Payer: Medicare Other | Admitting: Family Medicine

## 2016-09-08 ENCOUNTER — Encounter: Payer: Self-pay | Admitting: Family Medicine

## 2016-09-08 VITALS — BP 134/72 | HR 69 | Temp 98.6°F | Ht 70.25 in | Wt 203.0 lb

## 2016-09-08 DIAGNOSIS — I10 Essential (primary) hypertension: Secondary | ICD-10-CM | POA: Diagnosis not present

## 2016-09-08 DIAGNOSIS — Z0000 Encounter for general adult medical examination without abnormal findings: Secondary | ICD-10-CM | POA: Diagnosis not present

## 2016-09-08 DIAGNOSIS — K219 Gastro-esophageal reflux disease without esophagitis: Secondary | ICD-10-CM

## 2016-09-08 DIAGNOSIS — E785 Hyperlipidemia, unspecified: Secondary | ICD-10-CM

## 2016-09-08 DIAGNOSIS — G8929 Other chronic pain: Secondary | ICD-10-CM | POA: Diagnosis not present

## 2016-09-08 DIAGNOSIS — K3184 Gastroparesis: Secondary | ICD-10-CM | POA: Diagnosis not present

## 2016-09-08 DIAGNOSIS — M5442 Lumbago with sciatica, left side: Secondary | ICD-10-CM | POA: Diagnosis not present

## 2016-09-08 MED ORDER — SIMVASTATIN 20 MG PO TABS: 20.0000 mg | ORAL_TABLET | Freq: Every day | ORAL | 3 refills | Status: DC

## 2016-09-08 NOTE — Progress Notes (Addendum)
Phone: [phone]  Subjective:  Patient presents today for their annual wellness visit.    Preventive Screening-Counseling & Management  Vision screen: normal with glasses  Visual Acuity Screening   Right eye Left eye Both eyes  Without correction:     With correction: 20/20 20/20 20/20    Advanced directives: Full Code. No HCPOA- would want wife to make decisions. Gave packet of information to read over.   Smoking Status: Never Smoker (under 100 cigarettes per lifetime) Second Hand Smoking status: No smokers in home  Risk Factors Regular exercise: exercising at least 30 minutes 5 days a week Diet: discussed eating healthier diet to lower weight- cut down on nighttime ice cream  Fall Risk: None  Fall Risk  09/08/2016  Falls in the past year? No    Cardiac risk factors:  advanced age (older than 23 for men) Hyperlipidemia : compliant with simvastatin  No diabetes.  Hypertension controlled Family History: heart attack father age 25   Depression Screen None. PHQ2 0  Depression screen [facility] 2/9 09/08/2016 01/10/2012  Decreased Interest 0 0  Down, Depressed, Hopeless 0 0  PHQ - 2 Score 0 0    Activities of Daily Living Independent ADLs and IADLs - other than needs help with cleaning is back in the shower with prior back issues  Hearing Difficulties: -patient declines  Cognitive Testing No reported trouble.    Normal 3 word recall  List the Names of Other Physician/Practitioners you currently use: -Dr. Alinda Money urology -[facility]  - Dr. Silverio Decamp GI - Dr. Pearlie Oyster for asbestosis follow up - Dr. Rolena Infante orthopedics  Immunization History  Administered Date(s) Administered  . H1N1 03/21/2008  . Influenza Split 12/31/2010, 11/22/2011, 11/28/2012  . Influenza Whole 12/12/2007, 12/11/2008  . Influenza,inj,Quad PF,36+ Mos 12/12/2014, 12/31/2015  . Td 03/01/1995, 06/17/2008  . Tdap 06/11/2007  . Zoster 01/10/2012   Required Immunizations needed today :  Advised shingrix at pharmacy  Screening tests- up to date Prostate cancer history- follows with Dr. Alinda Money Colon cancer screening 07/19/12 with 5 year follow up Lung cancer screening- none needed with minimal smoking history  ROS- No pertinent positives discovered in course of AWV ROS- No chest pain or shortness of breath. No headache or blurry vision.   The following were reviewed and entered/updated in epic: Past Medical History:  Diagnosis Date  . Anxiety state, unspecified 09/01/2009  . ASTHMA 03/12/2007  . Cancer [facility])    prostate  . Gastroparesis 05/01/2009  . GERD 04/02/2009  . Headache(784.0) 03/07/2007  . HIATAL HERNIA 04/28/2009  . Hiatal hernia   . HYPERLIPIDEMIA 06/11/2007  . HYPERTENSION 03/07/2007  . Pneumonia    hx of  . Polyp of colon, hyperplastic   . PROSTATE CANCER, HX OF 03/07/2007  . Suspected exposure to asbestos    have asbestosis testing every year  . TRANSAMINASES, SERUM, ELEVATED 04/28/2009  . Tubular adenoma of colon   . Ulcer    as teenager   Patient Active Problem List   Diagnosis Date Noted  . Asbestosis ([place]) 02/05/2016    Priority: High  . Back pain 10/09/2013    Priority: High  . Anxiety state 09/01/2009    Priority: High  . Gastroparesis 05/01/2009    Priority: Medium  . Hyperlipidemia 06/11/2007    Priority: Medium  . Asthma 03/12/2007    Priority: Medium  . Essential hypertension 03/07/2007    Priority: Medium  . Migraine headache 03/07/2007    Priority: Medium  . PROSTATE CANCER, HX OF  03/07/2007    Priority: Medium  . Allergic rhinitis 11/22/2013    Priority: Low  . Overweight(278.02) 01/10/2012    Priority: Low  . HIATAL HERNIA 04/28/2009    Priority: Low  . GERD 04/02/2009    Priority: Low   Past Surgical History:  Procedure Laterality Date  . APPENDECTOMY    . BACK SURGERY  05/2011  . COLONOSCOPY    . LUMBAR LAMINECTOMY/DECOMPRESSION MICRODISCECTOMY N/A 10/09/2013   Procedure: L4-5 DECOMPRESSION/FARAMINOTOMY REVISION ;   Surgeon: Melina Schools, MD;  Location: [place];  Service: Orthopedics;  Laterality: N/A;  . PROSTATECTOMY  07/10/06  . ROTATOR CUFF REPAIR     9/09 on right  . TRANSFORAMINAL LUMBAR INTERBODY FUSION (TLIF) WITH PEDICLE SCREW FIXATION 1 LEVEL N/A 10/09/2013   Procedure: TLIF L5-S1;  Surgeon: Melina Schools, MD;  Location: [place];  Service: Orthopedics;  Laterality: N/A;    Family History  Problem Relation Age of Onset  . Pancreatic cancer Mother 25  . Hypertension Father   . Heart attack Father        78  . Hypertension Brother        x 2  . Colon cancer Neg Hx   . Rectal cancer Neg Hx   . Stomach cancer Neg Hx     Medications- reviewed and updated Current Outpatient Prescriptions  Medication Sig Dispense Refill  . albuterol (PROVENTIL HFA;VENTOLIN HFA) 108 (90 Base) MCG/ACT inhaler Inhale 2 puffs into the lungs every 6 (six) hours as needed for wheezing or shortness of breath. Reported on 05/07/2015 3 Inhaler 3  . amLODipine (NORVASC) 2.5 MG tablet Take 1 tablet (2.5 mg total) by mouth daily. 90 tablet 3  . azelastine (ASTELIN) 0.1 % nasal spray Place 2 sprays into both nostrils daily. Reported on 05/07/2015 30 mL 3  . butalbital-acetaminophen-caffeine (FIORICET, ESGIC) 50-325-40 MG tablet Take 1 tablet by mouth daily as needed. 30 tablet 3  . diazepam (VALIUM) 5 MG tablet Take 1 tablet (5 mg total) by mouth every 8 (eight) hours as needed. for anxiety 45 tablet 1  . fexofenadine (ALLEGRA) 180 MG tablet Take 1 tablet (180 mg total) by mouth daily. 90 tablet 3  . fluticasone (FLONASE) 50 MCG/ACT nasal spray USE ONE SPRAY AS DIRECTED ONCE DAILY 48 g 3  . gabapentin (NEURONTIN) 300 MG capsule Take 900 mg by mouth at bedtime.  1  . meloxicam (MOBIC) 7.5 MG tablet Take 7.5 mg by mouth daily.    . montelukast (SINGULAIR) 10 MG tablet Take 1 tablet (10 mg total) by mouth at bedtime. 90 tablet 3  . omeprazole (PRILOSEC) 40 MG capsule Take 1 capsule (40 mg total) by mouth 2 (two) times daily before a  meal. 60 capsule 3  . ondansetron (ZOFRAN) 4 MG tablet Take 1 tablet (4 mg total) by mouth every 8 (eight) hours as needed for nausea or vomiting. 30 tablet 3  . PARoxetine (PAXIL) 20 MG tablet Take 1 tablet (20 mg total) by mouth at bedtime. 90 tablet 1  . promethazine (PHENERGAN) 12.5 MG tablet Take 1 tablet (12.5 mg total) by mouth every 6 (six) hours as needed for nausea or vomiting. 30 tablet 3  . propranolol-hydrochlorothiazide (INDERIDE) 40-25 MG tablet Take 1 tablet by mouth 2 (two) times daily. 180 tablet 3  . simvastatin (ZOCOR) 20 MG tablet Take 1 tablet (20 mg total) by mouth at bedtime. 90 tablet 3  . traMADol (ULTRAM) 50 MG tablet Take 1 tablet (50 mg total) by mouth daily  as needed. 30 tablet 2  . VESICARE 5 MG tablet Take 1 tablet by mouth daily.     No current facility-administered medications for this visit.     Allergies-reviewed and updated Allergies  Allergen Reactions  . Propoxyphene Hcl Nausea And Vomiting         Social History   Social History  . Marital status: Married    Spouse name: N/A  . Number of children: 4  . Years of education: N/A   Occupational History  . Disabled    Social History Main Topics  . Smoking status: Former Smoker    Packs/day: 0.50    Years: 2.00    Types: Cigarettes    Quit date: 04/09/1983  . Smokeless tobacco: Never Used  . Alcohol use No  . Drug use: No  . Sexual activity: Yes   Other Topics Concern  . None   Social History Narrative   Married 1974. Wife-Helen. 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       Retired from [employer]: hunting, boat (off for 1 year in 2015)    Objective: BP 134/72 (BP Location: Left Arm, Patient Position: Sitting, Cuff Size: Large)   Pulse 69   Temp 98.6 F (37 C) (Oral)   Ht 5' 10.25" (1.784 m)   Wt 203 lb (92.1 kg)    SpO2 96%   BMI 28.92 kg/m  Gen: NAD, resting comfortably HEENT: Mucous membranes are moist. Oropharynx normal Neck: no thyromegaly CV: RRR no murmurs rubs or gallops Lungs: CTAB no crackles, wheeze, rhonchi Abdomen: soft/nontender/nondistended/normal bowel sounds. No rebound or guarding.  Ext: no edema Skin: warm, dry Neuro: grossly normal, moves all extremities, PERRLA No rectal needed with prostatectomy  Assessment/Plan:  AWV completed- discussed recommended screenings anddocumented any personalized health advice and referrals for preventive counseling. See AVS as well which was given to patient.   Status of chronic or acute concerns  Prostate cancer history- following with Dr. Alinda Money. Also on vesicare Lab Results  Component Value Date   PSA 0.00 (L) 06/07/2016   PSA 0.01 (L) 04/24/2014   PSA 0.00 (L) 03/19/2013     Essential hypertension S: controlled on Propranolol-hctz 40-25mg .  Amlodipine 2.5mg .  ASCVD 10 year risk calculation if age 58-79: on statin BP Readings from Last 3 Encounters:  09/08/16 134/72  06/15/16 120/80  06/07/16 130/82  A/P: We discussed blood pressure goal of <140/90. Continue current meds   Hyperlipidemia S: reasonably controlled on simvastatin 20mg  (rx had accidentally changed to 2 20mg  tablets when should have been one). No myalgias.  Lab Results  Component Value Date   CHOL 167 06/07/2016   HDL 43.20 06/07/2016   LDLCALC 86 06/07/2016   LDLDIRECT 75.0 08/07/2015   TRIG 189.0 (H) 06/07/2016   CHOLHDL 4 06/07/2016   A/P: well controlled   Back pain Chronic back pain- tramadol once a day as needed, valium also helps. Also takes mobic and on bad nights takes gabapentin 900mg  at beditme   6 months  Meds ordered this encounter  Medications  . gabapentin (NEURONTIN) 300 MG capsule    Sig: Take 900 mg by mouth at bedtime.    Refill:  1  . simvastatin (ZOCOR) 20 MG tablet    Sig: Take 1 tablet (20 mg total) by mouth  at bedtime.     Dispense:  90 tablet    Refill:  3    Return precautions advised. Garret Reddish, MD

## 2016-09-08 NOTE — Patient Instructions (Addendum)
  [name] , Thank you for taking time to come for your Welcome to Medicare Visit. I appreciate your ongoing commitment to your health goals. Please review the following plan we discussed and let me know if I can assist you in the future.   These are the goals we discussed: 1. Keep up great job with exercise 2. Work on trimming off the excess pounds Wt Readings from Last 3 Encounters:  09/08/16 203 lb (92.1 kg)  06/15/16 199 lb 6 oz (90.4 kg)  06/07/16 200 lb 3.2 oz (90.8 kg)  3. Consider shingrix to prevent shingles at pharmacy. You already have 50% coverage and this would increase you to 90% coverage/protection   This is a list of the screening recommended for you and due dates:  Health Maintenance  Topic Date Due  . Flu Shot  09/28/2016  . Colon Cancer Screening  07/19/2017  . Tetanus Vaccine  06/18/2018  .  Hepatitis C: One time screening is recommended by Center for Disease Control  (CDC) for  adults born from [birth year] through 1964.   Completed  . HIV Screening  Completed

## 2016-09-08 NOTE — Assessment & Plan Note (Signed)
Chronic back pain- tramadol once a day as needed, valium also helps. Also takes mobic and on bad nights takes gabapentin 900mg  at beditme

## 2016-09-08 NOTE — Assessment & Plan Note (Signed)
S: controlled on Propranolol-hctz 40-25mg .  Amlodipine 2.5mg .  ASCVD 10 year risk calculation if age 65-79: on statin BP Readings from Last 3 Encounters:  09/08/16 134/72  06/15/16 120/80  06/07/16 130/82  A/P: We discussed blood pressure goal of <140/90. Continue current meds

## 2016-09-08 NOTE — Assessment & Plan Note (Signed)
S: reasonably controlled on simvastatin 20mg  (rx had accidentally changed to 2 20mg  tablets when should have been one). No myalgias.  Lab Results  Component Value Date   CHOL 167 06/07/2016   HDL 43.20 06/07/2016   LDLCALC 86 06/07/2016   LDLDIRECT 75.0 08/07/2015   TRIG 189.0 (H) 06/07/2016   CHOLHDL 4 06/07/2016   A/P: well controlled

## 2016-09-13 DIAGNOSIS — N5201 Erectile dysfunction due to arterial insufficiency: Secondary | ICD-10-CM | POA: Diagnosis not present

## 2016-09-13 DIAGNOSIS — C61 Malignant neoplasm of prostate: Secondary | ICD-10-CM | POA: Diagnosis not present

## 2016-09-13 DIAGNOSIS — R350 Frequency of micturition: Secondary | ICD-10-CM | POA: Diagnosis not present

## 2016-09-29 ENCOUNTER — Other Ambulatory Visit: Payer: Self-pay | Admitting: Family Medicine

## 2016-09-30 NOTE — Telephone Encounter (Signed)
Yes thanks 

## 2016-10-13 ENCOUNTER — Other Ambulatory Visit: Payer: Self-pay | Admitting: *Deleted

## 2016-10-13 MED ORDER — OMEPRAZOLE 40 MG PO CPDR: 40.0000 mg | DELAYED_RELEASE_CAPSULE | Freq: Two times a day (BID) | ORAL | 3 refills | Status: DC

## 2016-12-07 ENCOUNTER — Ambulatory Visit (INDEPENDENT_AMBULATORY_CARE_PROVIDER_SITE_OTHER): Payer: Medicare Other | Admitting: Family Medicine

## 2016-12-07 ENCOUNTER — Telehealth: Payer: Self-pay

## 2016-12-07 ENCOUNTER — Telehealth: Payer: Self-pay | Admitting: Family Medicine

## 2016-12-07 ENCOUNTER — Encounter: Payer: Self-pay | Admitting: Family Medicine

## 2016-12-07 VITALS — BP 120/80 | HR 60 | Temp 97.7°F | Ht 70.25 in | Wt 198.6 lb

## 2016-12-07 DIAGNOSIS — I10 Essential (primary) hypertension: Secondary | ICD-10-CM | POA: Diagnosis not present

## 2016-12-07 DIAGNOSIS — J61 Pneumoconiosis due to asbestos and other mineral fibers: Secondary | ICD-10-CM | POA: Diagnosis not present

## 2016-12-07 DIAGNOSIS — F411 Generalized anxiety disorder: Secondary | ICD-10-CM

## 2016-12-07 DIAGNOSIS — G8929 Other chronic pain: Secondary | ICD-10-CM | POA: Diagnosis not present

## 2016-12-07 DIAGNOSIS — Z23 Encounter for immunization: Secondary | ICD-10-CM

## 2016-12-07 DIAGNOSIS — M5442 Lumbago with sciatica, left side: Secondary | ICD-10-CM

## 2016-12-07 DIAGNOSIS — E785 Hyperlipidemia, unspecified: Secondary | ICD-10-CM | POA: Diagnosis not present

## 2016-12-07 MED ORDER — CYCLOBENZAPRINE HCL 10 MG PO TABS: 5.0000 mg | ORAL_TABLET | Freq: Three times a day (TID) | ORAL | 5 refills | Status: DC | PRN

## 2016-12-07 NOTE — Telephone Encounter (Signed)
Patient scheduled for fasting labs 10/12 9am.  He forgot to do them before he left today. Please put orders in EPIC.  Ty,  -LL

## 2016-12-07 NOTE — Assessment & Plan Note (Signed)
Reviewing meds with patient today- told him I was concerned about tramadol, fiorcet, valium in combo. Would like to stop the valium which has been used for stomach distress by Dr. Olevia Perches in the past and I had assumed for short period. Can try flexeril for muscle spasms in back and is welcome to see if this helps with stomach but doubt. If has worsening GI symptoms encouraged GI follow up- to discuss medication given atypical regimen- may be better options out there

## 2016-12-07 NOTE — Patient Instructions (Addendum)
Flu shot today Prevnar 13 today  Send me the dates of your shingrix shot so we can add that to the computer  Try flexeril for back spasms. Try the medications Dr. Silverio Decamp gave you for your stomach- if worsening symptoms return to see her. I do not think we should continue valium due to other medicines you are on without using their first line treatments first- 2 meds she advised last time.

## 2016-12-07 NOTE — Assessment & Plan Note (Signed)
Diagnosed by Elyn Aquas, MD- follows yearly with CXR. Albuterol in the morning- he was told to take it twice a day by their office per patient- once a day really helps him with daytime activities.

## 2016-12-07 NOTE — Assessment & Plan Note (Signed)
S: last visit corrected simvastatin to 20mg  - LDL had been controlled with 86 in march A/P: continue current medications

## 2016-12-07 NOTE — Progress Notes (Signed)
Subjective:  Mark [name] is a 65 y.o. year old very pleasant male patient who presents for/with See problem oriented charting ROS- no chest pain. Shortness of breath with activity if misses albuterol. No edema. Occasional bloating  Past Medical History-  Patient Active Problem List   Diagnosis Date Noted  . Asbestosis ([place]) 02/05/2016    Priority: High  . Back pain 10/09/2013    Priority: High  . Anxiety state 09/01/2009    Priority: High  . Migraine headache 03/07/2007    Priority: High  . Gastroparesis 05/01/2009    Priority: Medium  . GERD 04/02/2009    Priority: Medium  . Hyperlipidemia 06/11/2007    Priority: Medium  . Asthma 03/12/2007    Priority: Medium  . Essential hypertension 03/07/2007    Priority: Medium  . PROSTATE CANCER, HX OF 03/07/2007    Priority: Medium  . Allergic rhinitis 11/22/2013    Priority: Low  . Overweight(278.02) 01/10/2012    Priority: Low  . HIATAL HERNIA 04/28/2009    Priority: Low   Medications- reviewed and updated Current Outpatient Prescriptions  Medication Sig Dispense Refill  . albuterol (PROVENTIL HFA;VENTOLIN HFA) 108 (90 Base) MCG/ACT inhaler Inhale 2 puffs into the lungs every 6 (six) hours as needed for wheezing or shortness of breath. Reported on 05/07/2015 3 Inhaler 3  . amLODipine (NORVASC) 2.5 MG tablet Take 1 tablet (2.5 mg total) by mouth daily. 90 tablet 3  . Ascorbic Acid (VITAMIN C) 1000 MG tablet Take 1,000 mg by mouth daily.    [name] Kitchen azelastine (ASTELIN) 0.1 % nasal spray Place 2 sprays into both nostrils daily. Reported on 05/07/2015 30 mL 3  . butalbital-acetaminophen-caffeine (FIORICET, ESGIC) 50-325-40 MG tablet Take 1 tablet by mouth daily as needed. 30 tablet 3  . diazepam (VALIUM) 5 MG tablet Take 1 tablet (5 mg total) by mouth every 8 (eight) hours as needed. for anxiety 45 tablet 1  . fexofenadine (ALLEGRA) 180 MG tablet Take 1 tablet (180 mg total) by mouth daily. 90 tablet 3  . fluticasone (FLONASE) 50  MCG/ACT nasal spray USE ONE SPRAY AS DIRECTED ONCE DAILY 48 g 3  . gabapentin (NEURONTIN) 300 MG capsule Take 900 mg by mouth at bedtime.  1  . meloxicam (MOBIC) 7.5 MG tablet Take 7.5 mg by mouth daily.    . montelukast (SINGULAIR) 10 MG tablet Take 1 tablet (10 mg total) by mouth at bedtime. 90 tablet 3  . omeprazole (PRILOSEC) 40 MG capsule Take 1 capsule (40 mg total) by mouth 2 (two) times daily before a meal. 60 capsule 3  . ondansetron (ZOFRAN) 4 MG tablet Take 1 tablet (4 mg total) by mouth every 8 (eight) hours as needed for nausea or vomiting. 30 tablet 3  . PARoxetine (PAXIL) 20 MG tablet Take 1 tablet (20 mg total) by mouth at bedtime. 90 tablet 1  . promethazine (PHENERGAN) 12.5 MG tablet Take 1 tablet (12.5 mg total) by mouth every 6 (six) hours as needed for nausea or vomiting. 30 tablet 3  . propranolol-hydrochlorothiazide (INDERIDE) 40-25 MG tablet Take 1 tablet by mouth 2 (two) times daily. 180 tablet 3  . simvastatin (ZOCOR) 20 MG tablet Take 1 tablet (20 mg total) by mouth at bedtime. 90 tablet 3  . traMADol (ULTRAM) 50 MG tablet TAKE 1 TABLET BY MOUTH EVERY DAY AS NEEDED 30 tablet 2  . VESICARE 5 MG tablet Take 1 tablet by mouth daily.     No current facility-administered medications for this  visit.     Objective: BP 120/80   Pulse 60   Temp 97.7 F (36.5 C) (Oral)   Ht 5' 10.25" (1.784 m)   Wt 198 lb 9.6 oz (90.1 kg)   SpO2 97%   BMI 28.29 kg/m  Gen: NAD, resting comfortably CV: RRR no murmurs rubs or gallops Lungs: CTAB no crackles, wheeze, rhonchi Abdomen: soft/nontender/nondistended/normal bowel sounds. overweight Skin: warm, dry  Assessment/Plan:  Hyperlipidemia S: last visit corrected simvastatin to 20mg  - LDL had been controlled with 86 in march A/P: continue current medications   Back pain S: chronic back pain- continues tramadol once a day as needed (most days) as well as mobic daily, valium with spasms. On rougher days takes gabapentin 900mg  at  bedtime. Laying down on firm table helps him.  A/P: discussed risks of benzos and narcotics together- trial flexeril instead of diazepam. Continue mobic and tramadol  Diazepam listed for anxiety/stomach issues by Dr. Olevia Perches in past- would want GI input to restart if needed.   Essential hypertension S: controlled on amlodipine 2.5mg , propranolol-hctz 40-25mg .  BP Readings from Last 3 Encounters:  12/07/16 120/80  09/08/16 134/72  06/15/16 120/80  A/P: We discussed blood pressure goal of <140/90. Continue current meds  Anxiety state Reviewing meds with patient today- told him I was concerned about tramadol, fiorcet, valium in combo. Would like to stop the valium which has been used for stomach distress by Dr. Olevia Perches in the past and I had assumed for short period. Can try flexeril for muscle spasms in back and is welcome to see if this helps with stomach but doubt. If has worsening GI symptoms encouraged GI follow up- to discuss medication given atypical regimen- may be better options out there  Asbestosis ([place]) Diagnosed by Elyn Aquas, MD- follows yearly with CXR. Albuterol in the morning- he was told to take it twice a day by their office per patient- once a day really helps him with daytime activities.   Return in about 6 months (around 06/07/2017) for follow up- verbally advised AWV with Cassie.  Meds ordered this encounter  Medications  . Ascorbic Acid (VITAMIN C) 1000 MG tablet    Sig: Take 1,000 mg by mouth daily.  . cyclobenzaprine (FLEXERIL) 10 MG tablet    Sig: Take 0.5-1 tablets (5-10 mg total) by mouth 3 (three) times daily as needed for muscle spasms.    Dispense:  60 tablet    Refill:  5    Return precautions advised.  Garret Reddish, MD

## 2016-12-07 NOTE — Assessment & Plan Note (Signed)
S: controlled on amlodipine 2.5mg , propranolol-hctz 40-25mg .  BP Readings from Last 3 Encounters:  12/07/16 120/80  09/08/16 134/72  06/15/16 120/80  A/P: We discussed blood pressure goal of <140/90. Continue current meds

## 2016-12-07 NOTE — Telephone Encounter (Signed)
bmet under hypertension only- more extensive labs next visit in 6 months

## 2016-12-07 NOTE — Telephone Encounter (Signed)
Error

## 2016-12-07 NOTE — Telephone Encounter (Signed)
Dr. Yong Channel what labs was patient suppose to have today? He left without getting them done but I do not see orders. Please advise.

## 2016-12-07 NOTE — Addendum Note (Signed)
Addended by: Lyndle Herrlich on: 12/07/2016 09:55 AM   Modules accepted: Orders

## 2016-12-07 NOTE — Assessment & Plan Note (Signed)
S: chronic back pain- continues tramadol once a day as needed (most days) as well as mobic daily, valium with spasms. On rougher days takes gabapentin 900mg  at bedtime. Laying down on firm table helps him.  A/P: discussed risks of benzos and narcotics together- trial flexeril instead of diazepam. Continue mobic and tramadol  Diazepam listed for anxiety/stomach issues by Dr. Olevia Perches in past- would want GI input to restart if needed.

## 2016-12-08 ENCOUNTER — Other Ambulatory Visit: Payer: Self-pay

## 2016-12-08 DIAGNOSIS — I10 Essential (primary) hypertension: Secondary | ICD-10-CM

## 2016-12-08 NOTE — Telephone Encounter (Signed)
Order for BMET entered

## 2016-12-08 NOTE — Telephone Encounter (Signed)
Called patient and left a voicemail message for him to call the office and schedule a lab appointment

## 2016-12-09 ENCOUNTER — Other Ambulatory Visit: Payer: Medicare Other

## 2016-12-09 ENCOUNTER — Telehealth: Payer: Self-pay | Admitting: Family Medicine

## 2016-12-09 NOTE — Telephone Encounter (Signed)
Received PA request for Cyclobenzaprine. PA pending. Key: Jolaine Click

## 2016-12-12 ENCOUNTER — Other Ambulatory Visit (INDEPENDENT_AMBULATORY_CARE_PROVIDER_SITE_OTHER): Payer: Medicare Other

## 2016-12-12 DIAGNOSIS — I10 Essential (primary) hypertension: Secondary | ICD-10-CM

## 2016-12-12 LAB — BASIC METABOLIC PANEL
BUN: 11 mg/dL (ref 6–23)
CALCIUM: 9.4 mg/dL (ref 8.4–10.5)
CO2: 33 meq/L — AB (ref 19–32)
CREATININE: 0.82 mg/dL (ref 0.40–1.50)
Chloride: 99 mEq/L (ref 96–112)
GFR: 121.17 mL/min (ref >60.00)
GLUCOSE: 96 mg/dL (ref 70–99)
Potassium: 3.3 mEq/L — ABNORMAL LOW (ref 3.5–5.1)
Sodium: 140 mEq/L (ref 135–145)

## 2016-12-13 NOTE — Telephone Encounter (Signed)
Patient called and stated that prescription that was prescribed by Dr. Yong Channel needed a PA. I advised the patient that a PA has been started.

## 2016-12-20 ENCOUNTER — Ambulatory Visit: Payer: Medicare Other | Admitting: *Deleted

## 2016-12-29 ENCOUNTER — Telehealth: Payer: Self-pay | Admitting: Family Medicine

## 2016-12-29 NOTE — Telephone Encounter (Signed)
Received documentation from [facility] that they approved cyclobenzaprine from 12/30/185-02/27/18

## 2017-03-07 ENCOUNTER — Other Ambulatory Visit: Payer: Self-pay

## 2017-03-07 MED ORDER — MONTELUKAST SODIUM 10 MG PO TABS: 10.0000 mg | ORAL_TABLET | Freq: Every day | ORAL | 3 refills | Status: DC

## 2017-03-07 MED ORDER — PAROXETINE HCL 20 MG PO TABS: 20.0000 mg | ORAL_TABLET | Freq: Every day | ORAL | 1 refills | Status: DC

## 2017-03-24 ENCOUNTER — Telehealth: Payer: Self-pay | Admitting: Family Medicine

## 2017-03-24 NOTE — Telephone Encounter (Signed)
Requests for controlled meds: butalbital-acetaminophen-caffeine and traMADol.  LOV  12/07/16 NOV  06/07/17  Pharmacy : [facility], [place], Fax: [phone]

## 2017-03-24 NOTE — Telephone Encounter (Signed)
Copied from [place] [phone]. Topic: Quick Communication - Rx Refill/Question >> Mar 24, 2017 12:02 PM Patrice Paradise wrote: Medication:   butalbital-acetaminophen-caffeine (FIORICET, ESGIC) 50-325-40 MG tablet  traMADol (ULTRAM) 50 MG tablet    Has the patient contacted their pharmacy? Yes.     (Agent: If no, request that the patient contact the pharmacy for the refill.)   Preferred Pharmacy (with phone number or street name):  [id number] - [place], [address] [place] [address] Phone: [phone] Fax: [phone]     Agent: Please be advised that RX refills may take up to 3 business days. We ask that you follow-up with your pharmacy.

## 2017-03-30 ENCOUNTER — Other Ambulatory Visit: Payer: Self-pay

## 2017-03-30 MED ORDER — BUTALBITAL-APAP-CAFFEINE 50-325-40 MG PO TABS: 1.0000 | ORAL_TABLET | Freq: Every day | ORAL | 3 refills | Status: DC | PRN

## 2017-03-30 MED ORDER — PROPRANOLOL-HCTZ 40-25 MG PO TABS: 1.0000 | ORAL_TABLET | Freq: Two times a day (BID) | ORAL | 3 refills | Status: DC

## 2017-03-30 MED ORDER — TRAMADOL HCL 50 MG PO TABS: 50.0000 mg | ORAL_TABLET | Freq: Every day | ORAL | 2 refills | Status: DC | PRN

## 2017-04-17 ENCOUNTER — Other Ambulatory Visit: Payer: Self-pay

## 2017-04-17 MED ORDER — AMLODIPINE BESYLATE 2.5 MG PO TABS: 2.5000 mg | ORAL_TABLET | Freq: Every day | ORAL | 3 refills | Status: DC

## 2017-05-04 ENCOUNTER — Other Ambulatory Visit: Payer: Self-pay

## 2017-05-04 MED ORDER — FEXOFENADINE HCL 180 MG PO TABS: 180.0000 mg | ORAL_TABLET | Freq: Every day | ORAL | 3 refills | Status: DC

## 2017-05-31 ENCOUNTER — Other Ambulatory Visit: Payer: Self-pay | Admitting: Family Medicine

## 2017-06-05 ENCOUNTER — Telehealth: Payer: Self-pay

## 2017-06-05 NOTE — Telephone Encounter (Signed)
I submitted a prior authorization on 06/02/17 for butalbital-acetaminophen-caffeine (FIORICET, ESGIC) 50-325-40 MG tablet. I received a denial of the PA and I submitted an appeal today 06/05/17. I am awaiting a response at this time

## 2017-06-07 ENCOUNTER — Encounter: Payer: Self-pay | Admitting: Family Medicine

## 2017-06-07 ENCOUNTER — Ambulatory Visit (INDEPENDENT_AMBULATORY_CARE_PROVIDER_SITE_OTHER): Payer: Medicare Other | Admitting: Family Medicine

## 2017-06-07 ENCOUNTER — Encounter: Payer: Self-pay | Admitting: Gastroenterology

## 2017-06-07 ENCOUNTER — Telehealth: Payer: Self-pay | Admitting: Gastroenterology

## 2017-06-07 VITALS — BP 120/86 | HR 60 | Temp 97.9°F | Ht 70.25 in | Wt 206.0 lb

## 2017-06-07 DIAGNOSIS — I10 Essential (primary) hypertension: Secondary | ICD-10-CM

## 2017-06-07 DIAGNOSIS — G8929 Other chronic pain: Secondary | ICD-10-CM

## 2017-06-07 DIAGNOSIS — E785 Hyperlipidemia, unspecified: Secondary | ICD-10-CM

## 2017-06-07 DIAGNOSIS — K3184 Gastroparesis: Secondary | ICD-10-CM | POA: Diagnosis not present

## 2017-06-07 DIAGNOSIS — J4520 Mild intermittent asthma, uncomplicated: Secondary | ICD-10-CM | POA: Diagnosis not present

## 2017-06-07 DIAGNOSIS — M5442 Lumbago with sciatica, left side: Secondary | ICD-10-CM

## 2017-06-07 MED ORDER — FLUTICASONE PROPIONATE 50 MCG/ACT NA SUSP: NASAL | 3 refills | Status: DC

## 2017-06-07 MED ORDER — AZELASTINE HCL 0.1 % NA SOLN: 2.0000 | Freq: Every day | NASAL | 3 refills | Status: DC

## 2017-06-07 NOTE — Telephone Encounter (Signed)
Okay to refill, he has functional cyclic vomiting and nausea.  Has not been seen in office since April 2018, please schedule follow-up visit.  Okay to see an extender if I have no available appointments

## 2017-06-07 NOTE — Assessment & Plan Note (Signed)
S: continued chronic back pain on tramadol once a day along with flexeril. Still with some pains into right leg at times.   When we added flexeril- he has been able to stop the daily mobic and valium. On tougher days does 900mg  gabapentin at bedtime. Laying down on firm table or bed helps.   Last visit we opted to try flexeril instead of diazepam to avoid benzos and narcotics and foricet together. He had been listed on diazepam for anxiety/stomach issues from Dr. Olevia Perches- would refer back to GI to restart if needed in future - I had prescribed short term after Dr. Olevia Perches retired A/P: continue current medicines- glad doing ok without mobic and diazepam

## 2017-06-07 NOTE — Telephone Encounter (Signed)
Please advise on refills.  

## 2017-06-07 NOTE — Assessment & Plan Note (Signed)
Advised follow up with Dr. Silverio Decamp. Mark [name] he is doing ok off of diazepam. He remains on ondansetron, phenergan and paxil

## 2017-06-07 NOTE — Progress Notes (Signed)
Subjective:  Mark [name] is a 67 y.o. year old very pleasant male patient who presents for/with See problem oriented charting ROS- contiued back pain, issues with nausea. No chest pain. No shortness of breath as long as asthma controlled   Past Medical History-  Patient Active Problem List   Diagnosis Date Noted  . Asbestosis ([place]) 02/05/2016    Priority: High  . Back pain 10/09/2013    Priority: High  . Anxiety state 09/01/2009    Priority: High  . Migraine headache 03/07/2007    Priority: High  . Gastroparesis 05/01/2009    Priority: Medium  . GERD 04/02/2009    Priority: Medium  . Hyperlipidemia 06/11/2007    Priority: Medium  . Asthma 03/12/2007    Priority: Medium  . Essential hypertension 03/07/2007    Priority: Medium  . PROSTATE CANCER, HX OF 03/07/2007    Priority: Medium  . Allergic rhinitis 11/22/2013    Priority: Low  . Overweight(278.02) 01/10/2012    Priority: Low  . HIATAL HERNIA 04/28/2009    Priority: Low    Medications- reviewed and updated Current Outpatient Medications  Medication Sig Dispense Refill  . amLODipine (NORVASC) 2.5 MG tablet Take 1 tablet (2.5 mg total) by mouth daily. 90 tablet 3  . Ascorbic Acid (VITAMIN C) 1000 MG tablet Take 1,000 mg by mouth daily.    [name] Kitchen azelastine (ASTELIN) 0.1 % nasal spray Place 2 sprays into both nostrils daily. Reported on 05/07/2015 30 mL 3  . butalbital-acetaminophen-caffeine (FIORICET, ESGIC) 50-325-40 MG tablet Take 1 tablet by mouth daily as needed. 30 tablet 3  . cyclobenzaprine (FLEXERIL) 10 MG tablet Take 0.5-1 tablets (5-10 mg total) by mouth 3 (three) times daily as needed for muscle spasms. 60 tablet 5  . fexofenadine (ALLEGRA) 180 MG tablet Take 1 tablet (180 mg total) by mouth daily. 90 tablet 3  . fluticasone (FLONASE) 50 MCG/ACT nasal spray USE ONE SPRAY AS DIRECTED ONCE DAILY 48 g 3  . gabapentin (NEURONTIN) 300 MG capsule Take 900 mg by mouth at bedtime.  1  . montelukast (SINGULAIR) 10 MG  tablet Take 1 tablet (10 mg total) by mouth at bedtime. 90 tablet 3  . omeprazole (PRILOSEC) 40 MG capsule Take 1 capsule (40 mg total) by mouth 2 (two) times daily before a meal. 60 capsule 3  . ondansetron (ZOFRAN) 4 MG tablet Take 1 tablet (4 mg total) by mouth every 8 (eight) hours as needed for nausea or vomiting. 30 tablet 3  . PARoxetine (PAXIL) 20 MG tablet Take 1 tablet (20 mg total) by mouth at bedtime. 90 tablet 1  . PROAIR HFA 108 (90 Base) MCG/ACT inhaler INHALE 2 PUFFS EVERY 6 HOURS AS NEEDED FOR WHEEZING OR SHORTNESS OF BREATH 25.5 Inhaler 2  . promethazine (PHENERGAN) 12.5 MG tablet Take 1 tablet (12.5 mg total) by mouth every 6 (six) hours as needed for nausea or vomiting. 30 tablet 3  . propranolol-hydrochlorothiazide (INDERIDE) 40-25 MG tablet Take 1 tablet by mouth 2 (two) times daily. 180 tablet 3  . simvastatin (ZOCOR) 20 MG tablet Take 1 tablet (20 mg total) by mouth at bedtime. 90 tablet 3  . traMADol (ULTRAM) 50 MG tablet Take 1 tablet (50 mg total) by mouth daily as needed. 30 tablet 2  . VESICARE 5 MG tablet Take 1 tablet by mouth daily.     No current facility-administered medications for this visit.     Objective: BP 120/86 (BP Location: Left Arm, Patient Position: Sitting, Cuff  Size: Large)   Pulse 60   Temp 97.9 F (36.6 C) (Oral)   Ht 5' 10.25" (1.784 m)   Wt 206 lb (93.4 kg)   SpO2 95%   BMI 29.35 kg/m  Gen: NAD, resting comfortably CV: RRR no murmurs rubs or gallops Lungs: CTAB no crackles, wheeze, rhonchi Abdomen: soft/nontender/nondistended/normal bowel sounds. Ext: no edema Skin: warm, dry Prefers to lay on table as helps his back pain  Assessment/Plan:  Urology prescribing vesicare Dr. Alinda Money and testing PSA and doing rectal  Hypertension S: controlled on  amlodipine 2.5mg , propranolol hctz 40-25mg  BP Readings from Last 3 Encounters:  06/07/17 120/86  12/07/16 120/80  09/08/16 134/72  A/P: We discussed blood pressure goal of <140/90.  Continue current meds  Hyperlipidemia S: well controlled on simvastatin 20mg  with LDL under 100. Last checked 1 year ago  A/P: update lipids when comes back fasting  Back pain S: continued chronic back pain on tramadol once a day along with flexeril. Still with some pains into right leg at times.   When we added flexeril- he has been able to stop the daily mobic and valium. On tougher days does 900mg  gabapentin at bedtime. Laying down on firm table or bed helps.   Last visit we opted to try flexeril instead of diazepam to avoid benzos and narcotics and foricet together. He had been listed on diazepam for anxiety/stomach issues from Dr. Olevia Perches- would refer back to GI to restart if needed in future - I had prescribed short term after Dr. Olevia Perches retired A/P: continue current medicines- glad doing ok without mobic and diazepam  Gastroparesis Advised follow up with Dr. Lysbeth Galas he is doing ok off of diazepam. He remains on ondansetron, phenergan and paxil  Asthma On singulair-Albuterol 1-2x a week as long as keeps allergies controlled- on allegra, astelin. Did refill flonase in case he needs this but has done well recently   Future Appointments  Date Time Provider [place]  09/19/2017 10:00 AM Williemae Area, RN [facility]  see avs for future vsiit plan  Lab/Order associations: Hyperlipidemia, unspecified hyperlipidemia type - Plan: Lipid panel, CBC, Comprehensive metabolic panel  Meds ordered this encounter  Medications  . azelastine (ASTELIN) 0.1 % nasal spray    Sig: Place 2 sprays into both nostrils daily. Reported on 05/07/2015    Dispense:  30 mL    Refill:  3  . fluticasone (FLONASE) 50 MCG/ACT nasal spray    Sig: USE ONE SPRAY AS DIRECTED ONCE DAILY    Dispense:  48 g    Refill:  3    Return precautions advised.  Garret Reddish, MD

## 2017-06-07 NOTE — Assessment & Plan Note (Signed)
S: well controlled on simvastatin 20mg  with LDL under 100. Last checked 1 year ago  A/P: update lipids when comes back fasting

## 2017-06-07 NOTE — Assessment & Plan Note (Signed)
On singulair-Albuterol 1-2x a week as long as keeps allergies controlled- on allegra, astelin. Did refill flonase in case he needs this but has done well recently

## 2017-06-07 NOTE — Patient Instructions (Addendum)
I refilled flonase- only use this if allergies dont feel like they are controlled with current medicines including astelin  Schedule a lab visit at the check out desk within 2 weeks. Return for future fasting labs meaning nothing but water after midnight please. Ok to take your medications with water.   See me in 4-6 months for follow up. Either see Cassie or Manuela Schwartz for your annual wellness visit either before visit with me or a few days prior.   No changes in medicine today

## 2017-06-08 MED ORDER — ONDANSETRON HCL 4 MG PO TABS: 4.0000 mg | ORAL_TABLET | Freq: Three times a day (TID) | ORAL | 3 refills | Status: DC | PRN

## 2017-06-08 MED ORDER — PROMETHAZINE HCL 12.5 MG PO TABS: 12.5000 mg | ORAL_TABLET | Freq: Four times a day (QID) | ORAL | 3 refills | Status: DC | PRN

## 2017-06-08 NOTE — Telephone Encounter (Signed)
Dr Silverio Decamp I sent refills for patient and he has an appointment for a previsit and a colon in June  Do you need his appointment after the colonoscopy?

## 2017-06-08 NOTE — Telephone Encounter (Signed)
Can you try to bring him in for before the colonoscopy, that way can take care of everything with 1 visit instead of 2?  Okay to schedule with extender if I have no available appointments

## 2017-06-09 NOTE — Telephone Encounter (Signed)
Scheduled patient to see Wallace Going on 06/27/2017 at 3pm Left message for patient to return my call

## 2017-06-26 ENCOUNTER — Other Ambulatory Visit (INDEPENDENT_AMBULATORY_CARE_PROVIDER_SITE_OTHER): Payer: Medicare Other

## 2017-06-26 DIAGNOSIS — E785 Hyperlipidemia, unspecified: Secondary | ICD-10-CM

## 2017-06-26 LAB — LIPID PANEL
CHOL/HDL RATIO: 4
Cholesterol: 144 mg/dL (ref 0–200)
HDL: 38 mg/dL — AB (ref >39.00)
LDL Cholesterol: 72 mg/dL (ref 0–99)
NONHDL: 105.87
Triglycerides: 169 mg/dL — ABNORMAL HIGH (ref 0.0–149.0)
VLDL: 33.8 mg/dL (ref 0.0–40.0)

## 2017-06-26 LAB — CBC
HCT: 44.8 % (ref 39.0–52.0)
HEMOGLOBIN: 15.4 g/dL (ref 13.0–17.0)
MCHC: 34.4 g/dL (ref 30.0–36.0)
MCV: 94.7 fl (ref 78.0–100.0)
Platelets: 255 10*3/uL (ref 150.0–400.0)
RBC: 4.74 Mil/uL (ref 4.22–5.81)
RDW: 12.9 % (ref 11.5–15.5)
WBC: 5.1 10*3/uL (ref 4.0–10.5)

## 2017-06-26 LAB — COMPREHENSIVE METABOLIC PANEL
ALK PHOS: 74 U/L (ref 39–117)
ALT: 23 U/L (ref 0–53)
AST: 18 U/L (ref 0–37)
Albumin: 3.8 g/dL (ref 3.5–5.2)
BILIRUBIN TOTAL: 0.6 mg/dL (ref 0.2–1.2)
BUN: 13 mg/dL (ref 6–23)
CO2: 33 mEq/L — ABNORMAL HIGH (ref 19–32)
Calcium: 9.3 mg/dL (ref 8.4–10.5)
Chloride: 102 mEq/L (ref 96–112)
Creatinine, Ser: 0.92 mg/dL (ref 0.40–1.50)
GFR: 105.93 mL/min (ref >60.00)
GLUCOSE: 91 mg/dL (ref 70–99)
POTASSIUM: 4 meq/L (ref 3.5–5.1)
SODIUM: 141 meq/L (ref 135–145)
TOTAL PROTEIN: 6.5 g/dL (ref 6.0–8.3)

## 2017-06-27 ENCOUNTER — Ambulatory Visit (INDEPENDENT_AMBULATORY_CARE_PROVIDER_SITE_OTHER): Payer: Medicare Other | Admitting: Nurse Practitioner

## 2017-06-27 ENCOUNTER — Encounter: Payer: Self-pay | Admitting: Nurse Practitioner

## 2017-06-27 VITALS — BP 118/80 | HR 74 | Ht 70.0 in | Wt 207.1 lb

## 2017-06-27 DIAGNOSIS — Z860100 Personal history of colon polyps, unspecified: Secondary | ICD-10-CM

## 2017-06-27 DIAGNOSIS — R112 Nausea with vomiting, unspecified: Secondary | ICD-10-CM | POA: Diagnosis not present

## 2017-06-27 NOTE — Patient Instructions (Signed)
If you are age 66 or older, your body mass index should be between 23-30. Your Body mass index is 29.72 kg/m. If this is out of the aforementioned range listed, please consider follow up with your Primary Care Provider.  If you are age 64 or younger, your body mass index should be between 19-25. Your Body mass index is 29.72 kg/m. If this is out of the aformentioned range listed, please consider follow up with your Primary Care Provider.   You have been scheduled for a colonoscopy. Please follow written instructions given to you at your visit today.  Please pick up your prep supplies at the pharmacy within the next 1-3 days. If you use inhalers (even only as needed), please bring them with you on the day of your procedure. Your physician has requested that you go to www.startemmi.com and enter the access code given to you at your visit today. This web site gives a general overview about your procedure. However, you should still follow specific instructions given to you by our office regarding your preparation for the procedure.  We have sent the following medications to your pharmacy for you to pick up at your convenience: Suprep  You have been given samples of IBgard. Take 1-2 twice daily.  Thank you for choosing me and [facility].   Tye Savoy, NP

## 2017-06-27 NOTE — Progress Notes (Signed)
IMPRESSION and PLAN:    #56.  67 year old male with chronic nausea, occasional vomiting.  Delayed gastric emptying on 2-hour gastric emptying study in 2011 but normal emptying on the 4-hour study last year.  He continues to have frequent bloating and nausea, especially with consumption of large meals.  Given the normal 4-hour gastric emptying study, etiology of chronic nausea and vomiting unclear but antiemetics certainly help. -We have already called in refills of Zofran for him -FD Guard helped bloating and he would like more samples if available. If not get them over-the-counter intake 1 to 2 tablets twice a day     #2.  We have adenomatous colon polyps already scheduled for surveillance colonoscopy. -Previsit canceled, we can give him instructions and prescription for bowel prep today.  The risks and benefits of the colonoscopy with possible polypectomy were discussed and the patient agrees to proceed.    HPI:    Chief Complaint: Follow-up on nausea and vomiting  Patient is a 67 year old male previously followed by Dr. Olevia Perches, now Dr. Silverio Decamp for history of adenomatous colon polyps, GERD, chronic bloating/dyspepsia. In 2011 he had a 2-hour gastric emptying study showing slight delay in emptying.  He had a 4-hour gastric emptying study in April of last year however this showed normal emptying.    Patient is due for surveillance colonoscopy and is actually already scheduled for one.  Called in for a refill on Zofran but since we had not seen him since April 2018 an appointment was advised. He continues to have nausea with occasional vomiting, especially with consumption of large meals.  He can eat steak but not [place], less it is ground up.  He takes promethazine as well as Zofran as needed at home.  He tends to take the Zofran when nausea first starts and uses the Phenergan when the nausea is more intense.  Phenergan does make him sleepy so he does not drive while taking it.  Patient's  weight is stable.   Review of systems:     No chest pain, no shortness of breath, no urinary problems  Past Medical History:  Diagnosis Date  . Anxiety state, unspecified 09/01/2009  . ASTHMA 03/12/2007  . Cancer [facility])    prostate  . Gastroparesis 05/01/2009  . GERD 04/02/2009  . Headache(784.0) 03/07/2007  . HIATAL HERNIA 04/28/2009  . Hiatal hernia   . HYPERLIPIDEMIA 06/11/2007  . HYPERTENSION 03/07/2007  . Pneumonia    hx of  . Polyp of colon, hyperplastic   . PROSTATE CANCER, HX OF 03/07/2007  . Suspected exposure to asbestos    have asbestosis testing every year  . TRANSAMINASES, SERUM, ELEVATED 04/28/2009  . Tubular adenoma of colon   . Ulcer    as teenager    Patient's surgical history, family medical history, social history, medications and allergies were all reviewed in Epic    Physical Exam:     BP 118/80   Pulse 74   Ht 5\' 10"  (1.778 m)   Wt 207 lb 2 oz (94 kg)   BMI 29.72 kg/m   GENERAL: Well-developed black male in NAD PSYCH: :Pleasant, cooperative, normal affect EENT:  conjunctiva pink, mucous membranes moist, neck supple without masses CARDIAC:  RRR, no murmur heard, no peripheral edema PULM: Normal respiratory effort, lungs CTA bilaterally, no wheezing ABDOMEN:  Nondistended, soft, nontender. No obvious masses, no hepatomegaly,  normal bowel sounds SKIN:  turgor, no lesions seen Musculoskeletal:  Normal muscle tone, normal strength NEURO: Alert  and oriented x 3, no focal neurologic deficits   Tye Savoy , NP 06/27/2017, 3:34 PM

## 2017-06-30 NOTE — Progress Notes (Signed)
Reviewed and agree with documentation and assessment and plan. K. Veena Jaleesa Cervi , MD   

## 2017-07-13 DIAGNOSIS — H524 Presbyopia: Secondary | ICD-10-CM | POA: Diagnosis not present

## 2017-07-13 DIAGNOSIS — H2513 Age-related nuclear cataract, bilateral: Secondary | ICD-10-CM | POA: Diagnosis not present

## 2017-07-25 DIAGNOSIS — M545 Low back pain: Secondary | ICD-10-CM | POA: Diagnosis not present

## 2017-07-25 DIAGNOSIS — Z981 Arthrodesis status: Secondary | ICD-10-CM | POA: Diagnosis not present

## 2017-08-04 ENCOUNTER — Telehealth: Payer: Self-pay | Admitting: Gastroenterology

## 2017-08-04 MED ORDER — NA SULFATE-K SULFATE-MG SULF 17.5-3.13-1.6 GM/177ML PO SOLN: 1.0000 | Freq: Once | ORAL | 0 refills | Status: AC

## 2017-08-04 NOTE — Telephone Encounter (Signed)
Pt called to inform that his pharmacy did not receive prescription for suprep. Pls send it again. He uses [facility] in [place].

## 2017-08-04 NOTE — Telephone Encounter (Signed)
[facility]rep sent [facility] morning to [facility] as requested by patient     Informed patient

## 2017-08-21 ENCOUNTER — Ambulatory Visit (AMBULATORY_SURGERY_CENTER): Payer: Medicare Other | Admitting: Gastroenterology

## 2017-08-21 ENCOUNTER — Encounter: Payer: Self-pay | Admitting: Gastroenterology

## 2017-08-21 ENCOUNTER — Other Ambulatory Visit: Payer: Self-pay

## 2017-08-21 VITALS — BP 104/66 | HR 54 | Temp 98.7°F | Resp 14 | Ht 70.0 in | Wt 207.0 lb

## 2017-08-21 DIAGNOSIS — D128 Benign neoplasm of rectum: Secondary | ICD-10-CM

## 2017-08-21 DIAGNOSIS — D122 Benign neoplasm of ascending colon: Secondary | ICD-10-CM | POA: Diagnosis not present

## 2017-08-21 DIAGNOSIS — I10 Essential (primary) hypertension: Secondary | ICD-10-CM | POA: Diagnosis not present

## 2017-08-21 DIAGNOSIS — D129 Benign neoplasm of anus and anal canal: Secondary | ICD-10-CM

## 2017-08-21 DIAGNOSIS — E669 Obesity, unspecified: Secondary | ICD-10-CM | POA: Diagnosis not present

## 2017-08-21 DIAGNOSIS — J45909 Unspecified asthma, uncomplicated: Secondary | ICD-10-CM | POA: Diagnosis not present

## 2017-08-21 DIAGNOSIS — Z86010 Personal history of colonic polyps: Secondary | ICD-10-CM

## 2017-08-21 DIAGNOSIS — K621 Rectal polyp: Secondary | ICD-10-CM | POA: Diagnosis not present

## 2017-08-21 DIAGNOSIS — K219 Gastro-esophageal reflux disease without esophagitis: Secondary | ICD-10-CM | POA: Diagnosis not present

## 2017-08-21 MED ORDER — SODIUM CHLORIDE 0.9 % IV SOLN: 500.0000 mL | Freq: Once | INTRAVENOUS | Status: DC

## 2017-08-21 NOTE — Progress Notes (Signed)
Called to room to assist during endoscopic procedure.  Patient ID and intended procedure confirmed with present staff. Received instructions for my participation in the procedure from the performing physician.  

## 2017-08-21 NOTE — Progress Notes (Signed)
Pt's states no medical or surgical changes since previsit or office visit. 

## 2017-08-21 NOTE — Progress Notes (Signed)
Report to PACU, RN, vss, BBS= Clear.  

## 2017-08-21 NOTE — Op Note (Signed)
[place] Patient Name: Mark [name] Procedure Date: 08/21/2017 10:27 AM MRN: [id number] Endoscopist: Mauri Pole , MD Age: 67 Referring MD:  Date of Birth: Jul 06, 1950 Gender: Male Account #: [id number] Procedure:                Colonoscopy Indications:              High risk colon cancer surveillance: Personal                            history of colonic polyps, High risk colon cancer                            surveillance: Personal history of adenoma (10 mm or                            greater in size), High risk colon cancer                            surveillance: Personal history of multiple (3 or                            more) adenomas Medicines:                Monitored Anesthesia Care Procedure:                Pre-Anesthesia Assessment:                           - Prior to the procedure, a History and Physical                            was performed, and patient medications and                            allergies were reviewed. The patient's tolerance of                            previous anesthesia was also reviewed. The risks                            and benefits of the procedure and the sedation                            options and risks were discussed with the patient.                            All questions were answered, and informed consent                            was obtained. Prior Anticoagulants: The patient has                            taken no previous anticoagulant or antiplatelet  agents. ASA Grade Assessment: II - A patient with                            mild systemic disease. After reviewing the risks                            and benefits, the patient was deemed in                            satisfactory condition to undergo the procedure.                           After obtaining informed consent, the colonoscope                            was passed under direct vision. Throughout the                          procedure, the patient's blood pressure, pulse, and                            oxygen saturations were monitored continuously. The                            Colonoscope was introduced through the anus and                            advanced to the the cecum, identified by                            appendiceal orifice and ileocecal valve. The                            colonoscopy was technically difficult and complex                            due to inadequate bowel prep. The patient tolerated                            the procedure well. The quality of the bowel                            preparation was inadequate. The ileocecal valve,                            appendiceal orifice, and rectum were photographed. Scope In: 10:36:14 AM Scope Out: 10:54:25 AM Scope Withdrawal Time: 0 hours 10 minutes 27 seconds  Total Procedure Duration: 0 hours 18 minutes 11 seconds  Findings:                 The perianal and digital rectal examinations were                            normal.  Two sessile polyps were found in the rectum and                            ascending colon. The polyps were 4 to 7 mm in size.                            These polyps were removed with a cold snare.                            Resection and retrieval were complete.                           Non-bleeding internal hemorrhoids were found during                            retroflexion. The hemorrhoids were small.                           A moderate amount of stool was found in the entire                            colon, interfering with visualization. Complications:            No immediate complications. Estimated Blood Loss:     Estimated blood loss was minimal. Impression:               - Preparation of the colon was inadequate.                           - Two 4 to 7 mm polyps in the rectum and in the                            ascending colon, removed with a cold  snare.                            Resected and retrieved.                           - Non-bleeding internal hemorrhoids.                           - Stool in the entire examined colon. Recommendation:           - Patient has a contact number available for                            emergencies. The signs and symptoms of potential                            delayed complications were discussed with the                            patient. Return to normal activities tomorrow.  Written discharge instructions were provided to the                            patient.                           - Resume previous diet.                           - Continue present medications.                           - Await pathology results.                           - Repeat colonoscopy at the next available                            appointment because the bowel preparation was poor.                           - For future colonoscopy the patient will require                            an extended preparation. If there are any                            questions, please contact the gastroenterologist. Mauri Pole, MD 08/21/2017 10:59:03 AM This report has been signed electronically.

## 2017-08-21 NOTE — Patient Instructions (Signed)
Impression/Recommendations:  Polyp handout given to patient. Hemorrhoid handout given to patient.  Resume previous diet. Continue present medications.  Repeat colonoscopy at next available appointment due to poor bowel preparation.  Future colonoscopy will require an extended preparation.  YOU HAD AN ENDOSCOPIC PROCEDURE TODAY AT [facility]:   Refer to the procedure report that was given to you for any specific questions about what was found during the examination.  If the procedure report does not answer your questions, please call your gastroenterologist to clarify.  If you requested that your care partner not be given the details of your procedure findings, then the procedure report has been included in a sealed envelope for you to review at your convenience later.  YOU SHOULD EXPECT: Some feelings of bloating in the abdomen. Passage of more gas than usual.  Walking can help get rid of the air that was put into your GI tract during the procedure and reduce the bloating. If you had a lower endoscopy (such as a colonoscopy or flexible sigmoidoscopy) you may notice spotting of blood in your stool or on the toilet paper. If you underwent a bowel prep for your procedure, you may not have a normal bowel movement for a few days.  Please Note:  You might notice some irritation and congestion in your nose or some drainage.  This is from the oxygen used during your procedure.  There is no need for concern and it should clear up in a day or so.  SYMPTOMS TO REPORT IMMEDIATELY:   Following lower endoscopy (colonoscopy or flexible sigmoidoscopy):  Excessive amounts of blood in the stool  Significant tenderness or worsening of abdominal pains  Swelling of the abdomen that is new, acute  Fever of 100F or higher For urgent or emergent issues, a gastroenterologist can be reached at any hour by calling [phone].   DIET:  We do recommend a small meal at first, but then you may  proceed to your regular diet.  Drink plenty of fluids but you should avoid alcoholic beverages for 24 hours.  ACTIVITY:  You should plan to take it easy for the rest of today and you should NOT DRIVE or use heavy machinery until tomorrow (because of the sedation medicines used during the test).    FOLLOW UP: Our staff will call the number listed on your records the next business day following your procedure to check on you and address any questions or concerns that you may have regarding the information given to you following your procedure. If we do not reach you, we will leave a message.  However, if you are feeling well and you are not experiencing any problems, there is no need to return our call.  We will assume that you have returned to your regular daily activities without incident.  If any biopsies were taken you will be contacted by phone or by letter within the next 1-3 weeks.  Please call us at [phone] if you have not heard about the biopsies in 3 weeks.    SIGNATURES/CONFIDENTIALITY: You and/or your care partner have signed paperwork which will be entered into your electronic medical record.  These signatures attest to the fact that that the information above on your After Visit Summary has been reviewed and is understood.  Full responsibility of the confidentiality of this discharge information lies with you and/or your care-partner.

## 2017-08-22 ENCOUNTER — Telehealth: Payer: Self-pay

## 2017-08-22 ENCOUNTER — Telehealth: Payer: Self-pay | Admitting: *Deleted

## 2017-08-22 NOTE — Telephone Encounter (Signed)
  Follow up Call-  Call back number 08/21/2017  Post procedure Call Back phone  # [phone]  Permission to leave phone message Yes  Some recent data might be hidden     Patient questions:  Do you have a fever, pain , or abdominal swelling? No. Pain Score  0 *  Have you tolerated food without any problems? Yes.    Have you been able to return to your normal activities? Yes.    Do you have any questions about your discharge instructions: Diet   No. Medications  No. Follow up visit  No.  Do you have questions or concerns about your Care? No.  Actions: * If pain score is 4 or above: No action needed, pain <4.

## 2017-09-04 ENCOUNTER — Encounter: Payer: Self-pay | Admitting: Gastroenterology

## 2017-09-05 DIAGNOSIS — C61 Malignant neoplasm of prostate: Secondary | ICD-10-CM | POA: Diagnosis not present

## 2017-09-05 LAB — PSA: PSA: 0.015

## 2017-09-08 ENCOUNTER — Encounter: Payer: Self-pay | Admitting: Family Medicine

## 2017-09-08 ENCOUNTER — Other Ambulatory Visit: Payer: Self-pay | Admitting: Family Medicine

## 2017-09-08 DIAGNOSIS — Z86010 Personal history of colonic polyps: Secondary | ICD-10-CM | POA: Insufficient documentation

## 2017-09-13 DIAGNOSIS — R350 Frequency of micturition: Secondary | ICD-10-CM | POA: Diagnosis not present

## 2017-09-13 DIAGNOSIS — Z8546 Personal history of malignant neoplasm of prostate: Secondary | ICD-10-CM | POA: Diagnosis not present

## 2017-09-13 DIAGNOSIS — N5201 Erectile dysfunction due to arterial insufficiency: Secondary | ICD-10-CM | POA: Diagnosis not present

## 2017-09-15 ENCOUNTER — Encounter: Payer: Self-pay | Admitting: Family Medicine

## 2017-09-18 NOTE — Progress Notes (Signed)
Subjective:   Mark [name] is a 67 y.o. male who presents for Medicare Annual/Subsequent preventive examination.  Review of Systems:  No ROS.  Medicare Wellness Visit. Additional risk factors are reflected in the social history.  Cardiac Risk Factors include: advanced age (>64mn, >>9women);male gender   Patient lives with wife in a single story house.Daughter and grandson (520years old) live with him also.      Objective:    Vitals: BP 138/72 (BP Location: Left Arm, Patient Position: Sitting, Cuff Size: Large)   Pulse (!) 58   Ht '5\' 10"'$  (1.778 m)   Wt 201 lb 3.2 oz (91.3 kg)   SpO2 94%   BMI 28.87 kg/m   Body mass index is 28.87 kg/m.  Advanced Directives 09/19/2017 08/21/2017 10/10/2013 10/09/2013 10/03/2013 07/29/2011 07/27/2011  Does Patient Have a Medical Advance Directive? Yes No No Patient would not like information;Patient does not have advance directive Patient would not like information;Patient does not have advance directive - Patient does not have advance directive  Type of Advance Directive [place] will - - - - - -  Does patient want to make changes to medical advance directive? No - Patient declined - - - - - -  Copy of [place]? No - copy requested - - - - - -  Would patient like information on creating a medical advance directive? - - No - patient declined information - - - -  Pre-existing out of facility DNR order (yellow form or pink MOST form) - - - - - No No    Tobacco Social History   Tobacco Use  Smoking Status Former Smoker  . Packs/day: 0.50  . Years: 2.00  . Pack years: 1.00  . Types: Cigarettes  . Last attempt to quit: 04/09/1983  . Years since quitting: 34.4  Smokeless Tobacco Never Used     Counseling given: Not Answered  Past Medical History:  Diagnosis Date  . Allergy    seasonal  . Anxiety state, unspecified 09/01/2009  . ASTHMA 03/12/2007  . Asthma   . Cancer ([facility] 2008   prostate cancer  . Gastroparesis 05/01/2009  . GERD 04/02/2009  . Headache(784.0) 03/07/2007  . HIATAL HERNIA 04/28/2009  . Hiatal hernia   . HYPERLIPIDEMIA 06/11/2007  . HYPERTENSION 03/07/2007  . Pneumonia    hx of  . Polyp of colon, hyperplastic   . PROSTATE CANCER, HX OF 03/07/2007  . Suspected exposure to asbestos    have asbestosis testing every year  . TRANSAMINASES, SERUM, ELEVATED 04/28/2009  . Tubular adenoma of colon   . Ulcer    as teenager   Past Surgical History:  Procedure Laterality Date  . APPENDECTOMY    . BACK SURGERY  05/2011  . COLONOSCOPY    . LUMBAR LAMINECTOMY/DECOMPRESSION MICRODISCECTOMY N/A 10/09/2013   Procedure: L4-5 DECOMPRESSION/FARAMINOTOMY REVISION ;  Surgeon: DMelina Schools MD;  [facility]  Service: Orthopedics;  Laterality: N/A;  . PROSTATECTOMY  07/10/06  . ROTATOR CUFF REPAIR     9/09 on right  . TRANSFORAMINAL LUMBAR INTERBODY FUSION (TLIF) WITH PEDICLE SCREW FIXATION 1 LEVEL N/A 10/09/2013   Procedure: TLIF L5-S1;  Surgeon: DMelina Schools MD;  [facility]  Service: Orthopedics;  Laterality: N/A;   Family History  Problem Relation Age of Onset  . Pancreatic cancer Mother 59 . Hypertension Father   . Heart attack Father        944 .  Hypertension Brother        x 2  . Hypertension Brother   . Kidney disease Brother   . Hypertension Brother   . Multiple sclerosis Sister   . Heart disease Sister   . Heart attack Sister   . Hypertension Son   . Heart attack Maternal Grandmother   . Heart attack Paternal Grandmother   . Heart attack Paternal Grandfather   . Colon cancer Neg Hx   . Rectal cancer Neg Hx   . Stomach cancer Neg Hx    Social History   Socioeconomic History  . Marital status: Married    Spouse name: Not on file  . Number of children: 4  . Years of education: Not on file  . Highest education level: Not on file  Occupational History  . Occupation: Disabled  Social Needs  . Financial resource strain: Not on file    . Food insecurity:    Worry: Not on file    Inability: Not on file  . Transportation needs:    Medical: Not on file    Non-medical: Not on file  Tobacco Use  . Smoking status: Former Smoker    Packs/day: 0.50    Years: 2.00    Pack years: 1.00    Types: Cigarettes    Last attempt to quit: 04/09/1983    Years since quitting: 34.4  . Smokeless tobacco: Never Used  Substance and Sexual Activity  . Alcohol use: No    Alcohol/week: 0.0 oz  . Drug use: No  . Sexual activity: Yes  Lifestyle  . Physical activity:    Days per week: Not on file    Minutes per session: Not on file  . Stress: Not on file  Relationships  . Social connections:    Talks on phone: Not on file    Gets together: Not on file    Attends religious service: Not on file    Active member of club or organization: Not on file    Attends meetings of clubs or organizations: Not on file    Relationship status: Not on file  Other Topics Concern  . Not on file  Social History Narrative   Married 1974. Wife-Helen. 4 kids Joshwa Hemric in [place] with 1 child, Lawson in [place], [place] no kids, Surveyor, mining in [place] with 3 kids (2 boys, 1 girl), Otila Kluver in [place] with 1 child.       Retired from [employer]: hunting, boat (off for 1 year in 2015)    Outpatient Encounter Medications as of 09/19/2017  Medication Sig  . amLODipine (NORVASC) 2.5 MG tablet Take 1 tablet (2.5 mg total) by mouth daily.  . Ascorbic Acid (VITAMIN C) 1000 MG tablet Take 1,000 mg by mouth daily.  [name] Kitchen azelastine (ASTELIN) 0.1 % nasal spray Place 2 sprays into both nostrils daily. Reported on 05/07/2015  . bisacodyl (DULCOLAX) 5 MG EC tablet Take 5 mg by mouth daily as needed for moderate constipation. Dulcolax 5 mg bowel prep #4-Take as directed  . butalbital-acetaminophen-caffeine (FIORICET, ESGIC) 50-325-40 MG tablet Take 1 tablet by mouth daily as needed.  . cyclobenzaprine (FLEXERIL) 10 MG tablet Take 0.5-1  tablets (5-10 mg total) by mouth 3 (three) times daily as needed for muscle spasms.  . fexofenadine (ALLEGRA) 180 MG tablet Take 1 tablet (180 mg total) by mouth daily. (Patient taking differently: Take 180 mg by mouth 2 (two) times daily. )  . fluticasone (  FLONASE) 50 MCG/ACT nasal spray USE ONE SPRAY AS DIRECTED ONCE DAILY  . gabapentin (NEURONTIN) 300 MG capsule Take 900 mg by mouth at bedtime.  . montelukast (SINGULAIR) 10 MG tablet Take 1 tablet (10 mg total) by mouth at bedtime.  . Na Sulfate-K Sulfate-Mg Sulf (SUPREP BOWEL PREP KIT) 17.5-3.13-1.6 GM/177ML SOLN Take 1 kit by mouth once for 1 dose.  [name] Kitchen omeprazole (PRILOSEC) 40 MG capsule Take 1 capsule (40 mg total) by mouth 2 (two) times daily before a meal.  . ondansetron (ZOFRAN) 4 MG tablet Take 1 tablet (4 mg total) by mouth every 8 (eight) hours as needed for nausea or vomiting.  [name] Kitchen PARoxetine (PAXIL) 20 MG tablet TAKE 1 TABLET BY MOUTH EVERYDAY AT BEDTIME  . Polyethylene Glycol 3350 (MIRALAX PO) Take by mouth. Miralax 238 gm bowel prep-Take as directed  . PROAIR HFA 108 (90 Base) MCG/ACT inhaler INHALE 2 PUFFS EVERY 6 HOURS AS NEEDED FOR WHEEZING OR SHORTNESS OF BREATH  . promethazine (PHENERGAN) 12.5 MG tablet Take 1 tablet (12.5 mg total) by mouth every 6 (six) hours as needed for nausea or vomiting.  . propranolol-hydrochlorothiazide (INDERIDE) 40-25 MG tablet Take 1 tablet by mouth 2 (two) times daily.  . simvastatin (ZOCOR) 20 MG tablet Take 1 tablet (20 mg total) by mouth at bedtime.  . traMADol (ULTRAM) 50 MG tablet Take 1 tablet (50 mg total) by mouth daily as needed.  . VESICARE 5 MG tablet Take 1 tablet by mouth daily.  . [DISCONTINUED] 0.9 %  sodium chloride infusion    No facility-administered encounter medications on file as of 09/19/2017.     Activities of Daily Living In your present state of health, do you have any difficulty performing the following activities: 09/19/2017  Hearing? N  Vision? N  Difficulty  concentrating or making decisions? N  Walking or climbing stairs? N  Dressing or bathing? N  Doing errands, shopping? N  Preparing Food and eating ? N  Using the Toilet? N  In the past six months, have you accidently leaked urine? N  Comment Patient sees Urology, Prostate Cancer hx  Do you have problems with loss of bowel control? N  Managing your Medications? N  Managing your Finances? N  Housekeeping or managing your Housekeeping? N  Some recent data might be hidden    Patient Care Team: Marin Olp, MD as PCP - General (Family Medicine)   Assessment:   This is a routine wellness examination for [place].  Exercise Activities and Dietary recommendations Current Exercise Habits: Home exercise routine, Type of exercise: Other - see comments(Swims at [facility] 2 days a week), Time (Minutes): 60, Frequency (Times/Week): 2, Weekly Exercise (Minutes/Week): 120, Intensity: Mild, Exercise limited by: orthopedic condition(s)   Breakfast: [place] bacon or sausage, jelly, hash brown, fried apples, toast, brown rice, fruit, milk bothers stomach, drinks 3-4 cups of coffee a day (9 sugars/Splenda and cream)  Lunch: Small salad and sandwich  Dinner: steak, lamb chop, crab legs, or fish, vegetable and a baked potato, small salad  Drinks over a gallon of water a day  Drinks 3-4 sodas a day, Coke Zero or diet pepsi   Goals    . Increase physical activity     Patient currently swimming 2 days a week. Wants to improve free style swimming    . Weight (lb) < 200 lb (90.7 kg)     Patient working to lose weight. Wants to lose 5-10 more pounds       Fall  Risk Fall Risk  09/19/2017 09/08/2016  Falls in the past year? No No   Depression Screen PHQ 2/9 Scores 09/19/2017 09/08/2016 01/10/2012  PHQ - 2 Score 0 0 0    Cognitive Function     6CIT Screen 09/19/2017  What Year? 0 points  What month? 0 points  What time? 0 points  Count back from 20 0 points  Months in reverse 0 points     Immunization History  Administered Date(s) Administered  . H1N1 03/21/2008  . Influenza Split 12/31/2010, 11/22/2011, 11/28/2012  . Influenza Whole 12/12/2007, 12/11/2008  . Influenza, High Dose Seasonal PF 12/07/2016  . Influenza,inj,Quad PF,6+ Mos 12/12/2014, 12/31/2015  . Pneumococcal Conjugate-13 12/07/2016  . Td 03/01/1995, 06/17/2008  . Tdap 06/11/2007  . Zoster 01/10/2012   Screening Tests Health Maintenance  Topic Date Due  . INFLUENZA VACCINE  09/28/2017  . PNA vac Low Risk Adult (2 of 2 - PPSV23) 12/07/2017  . TETANUS/TDAP  06/18/2018  . COLONOSCOPY  08/22/2022  . Hepatitis C Screening  Completed       Plan:   Follow up with PCP as directed.  I have personally reviewed and noted the following in the patient's chart:   . Medical and social history . Use of alcohol, tobacco or illicit drugs  . Current medications and supplements . Functional ability and status . Nutritional status . Physical activity . Advanced directives . List of other physicians . Vitals . Screenings to include cognitive, depression, and falls . Referrals and appointments  In addition, I have reviewed and discussed with patient certain preventive protocols, quality metrics, and best practice recommendations. A written personalized care plan for preventive services as well as general preventive health recommendations were provided to patient.     [place], [place]  [birth year]

## 2017-09-18 NOTE — Progress Notes (Signed)
PCP notes:None   Health maintenance: Up to date.   Abnormal screenings: None   Patient concerns: cough, has follow up with PCP today after this visit    Nurse concerns:None   Next PCP appt: Same day.

## 2017-09-19 ENCOUNTER — Encounter: Payer: Self-pay | Admitting: Family Medicine

## 2017-09-19 ENCOUNTER — Ambulatory Visit (INDEPENDENT_AMBULATORY_CARE_PROVIDER_SITE_OTHER): Payer: Medicare Other

## 2017-09-19 ENCOUNTER — Ambulatory Visit (AMBULATORY_SURGERY_CENTER): Payer: Self-pay

## 2017-09-19 ENCOUNTER — Ambulatory Visit (INDEPENDENT_AMBULATORY_CARE_PROVIDER_SITE_OTHER): Payer: Medicare Other | Admitting: Family Medicine

## 2017-09-19 VITALS — Ht 70.0 in | Wt 202.6 lb

## 2017-09-19 VITALS — BP 138/72 | HR 58 | Ht 70.0 in | Wt 201.2 lb

## 2017-09-19 VITALS — BP 138/72 | HR 58 | Temp 97.9°F | Ht 70.0 in | Wt 201.2 lb

## 2017-09-19 DIAGNOSIS — R059 Cough, unspecified: Secondary | ICD-10-CM

## 2017-09-19 DIAGNOSIS — R0989 Other specified symptoms and signs involving the circulatory and respiratory systems: Secondary | ICD-10-CM

## 2017-09-19 DIAGNOSIS — R05 Cough: Secondary | ICD-10-CM

## 2017-09-19 DIAGNOSIS — Z860101 Personal history of adenomatous and serrated colon polyps: Secondary | ICD-10-CM

## 2017-09-19 DIAGNOSIS — Z0000 Encounter for general adult medical examination without abnormal findings: Secondary | ICD-10-CM | POA: Diagnosis not present

## 2017-09-19 DIAGNOSIS — Z86010 Personal history of colonic polyps: Secondary | ICD-10-CM

## 2017-09-19 MED ORDER — PREDNISONE 20 MG PO TABS: ORAL_TABLET | ORAL | 0 refills | Status: DC

## 2017-09-19 MED ORDER — NA SULFATE-K SULFATE-MG SULF 17.5-3.13-1.6 GM/177ML PO SOLN: 1.0000 | Freq: Once | ORAL | 0 refills | Status: AC

## 2017-09-19 MED ORDER — DOXYCYCLINE HYCLATE 100 MG PO TABS: 100.0000 mg | ORAL_TABLET | Freq: Two times a day (BID) | ORAL | 0 refills | Status: AC

## 2017-09-19 NOTE — Progress Notes (Signed)
I have reviewed and agree with note, evaluation, plan. See my separate note from today in reference to cough.   Garret Reddish, MD

## 2017-09-19 NOTE — Patient Instructions (Signed)
Please stop by x-ray before you go  We will be in touch about next steps. Doesn't sounds or look like allergies. I am between atypical pneumonia and bronchitis. X-ray will help in decision making

## 2017-09-19 NOTE — Patient Instructions (Addendum)
Mr. Broady , Thank you for taking time to come for your Medicare Wellness Visit. I appreciate your ongoing commitment to your health goals. Please review the following plan we discussed and let me know if I can assist you in the future.   These are the goals we discussed: Goals    . Increase physical activity     Patient currently swimming 2 days a week. Wants to improve free style swimming    . Weight (lb) < 200 lb (90.7 kg)     Patient working to lose weight. Wants to lose 5-10 more pounds       This is a list of the screening recommended for you and due dates:  Health Maintenance  Topic Date Due  . Flu Shot  09/28/2017  . Pneumonia vaccines (2 of 2 - PPSV23) 12/07/2017  . Tetanus Vaccine  06/18/2018  . Colon Cancer Screening  08/22/2022  .  Hepatitis C: One time screening is recommended by Center for Disease Control  (CDC) for  adults born from [birth year] through 1964.   Completed   Preventive Care for Adults  A healthy lifestyle and preventive care can promote health and wellness. Preventive health guidelines for adults include the following key practices.  . A routine yearly physical is a good way to check with your health care provider about your health and preventive screening. It is a chance to share any concerns and updates on your health and to receive a thorough exam.  . Visit your dentist for a routine exam and preventive care every 6 months. Brush your teeth twice a day and floss once a day. Good oral hygiene prevents tooth decay and gum disease.  . The frequency of eye exams is based on your age, health, family medical history, use  of contact lenses, and other factors. Follow your health care provider's recommendations for frequency of eye exams.  . Eat a healthy diet. Foods like vegetables, fruits, whole grains, low-fat dairy products, and lean protein foods contain the nutrients you need without too many calories. Decrease your intake of foods high in solid fats, added  sugars, and salt. Eat the right amount of calories for you. Get information about a proper diet from your health care provider, if necessary.  . Regular physical exercise is one of the most important things you can do for your health. Most adults should get at least 150 minutes of moderate-intensity exercise (any activity that increases your heart rate and causes you to sweat) each week. In addition, most adults need muscle-strengthening exercises on 2 or more days a week.  Silver Sneakers may be a benefit available to you. To determine eligibility, you may visit the website: www.silversneakers.com or contact program at [phone] Mon-Fri between 8AM-8PM.   . Maintain a healthy weight. The body mass index (BMI) is a screening tool to identify possible weight problems. It provides an estimate of body fat based on height and weight. Your health care provider can find your BMI and can help you achieve or maintain a healthy weight.   For adults 20 years and older: ? A BMI below 18.5 is considered underweight. ? A BMI of 18.5 to 24.9 is normal. ? A BMI of 25 to 29.9 is considered overweight. ? A BMI of 30 and above is considered obese.   . Maintain normal blood lipids and cholesterol levels by exercising and minimizing your intake of saturated fat. Eat a balanced diet with plenty of fruit and vegetables. Blood tests for  lipids and cholesterol should begin at age 58 and be repeated every 5 years. If your lipid or cholesterol levels are high, you are over 50, or you are at high risk for heart disease, you may need your cholesterol levels checked more frequently. Ongoing high lipid and cholesterol levels should be treated with medicines if diet and exercise are not working.  . If you smoke, find out from your health care provider how to quit. If you do not use tobacco, please do not start.  . If you choose to drink alcohol, please do not consume more than 2 drinks per day. One drink is considered to  be 12 ounces (355 mL) of beer, 5 ounces (148 mL) of wine, or 1.5 ounces (44 mL) of liquor.  . If you are 42-47 years old, ask your health care provider if you should take aspirin to prevent strokes.  . Use sunscreen. Apply sunscreen liberally and repeatedly throughout the day. You should seek shade when your shadow is shorter than you. Protect yourself by wearing long sleeves, pants, a wide-brimmed hat, and sunglasses year round, whenever you are outdoors.  . Once a month, do a whole body skin exam, using a mirror to look at the skin on your back. Tell your health care provider of new moles, moles that have irregular borders, moles that are larger than a pencil eraser, or moles that have changed in shape or color.

## 2017-09-19 NOTE — Progress Notes (Addendum)
Subjective:  Mark [name] is a 67 y.o. year old very pleasant male patient who presents for/with See problem oriented charting ROS- no ear pain, runny nose, sneezing. No fever or chills.    Past Medical History-  Patient Active Problem List   Diagnosis Date Noted  . Asbestosis ([place]) 02/05/2016    Priority: High  . Back pain 10/09/2013    Priority: High  . Anxiety state 09/01/2009    Priority: High  . Migraine headache 03/07/2007    Priority: High  . Gastroparesis 05/01/2009    Priority: Medium  . GERD 04/02/2009    Priority: Medium  . Hyperlipidemia 06/11/2007    Priority: Medium  . Asthma 03/12/2007    Priority: Medium  . Essential hypertension 03/07/2007    Priority: Medium  . PROSTATE CANCER, HX OF 03/07/2007    Priority: Medium  . Allergic rhinitis 11/22/2013    Priority: Low  . Overweight(278.02) 01/10/2012    Priority: Low  . HIATAL HERNIA 04/28/2009    Priority: Low  . History of adenomatous polyp of colon 09/08/2017    Medications- reviewed and updated Current Outpatient Medications  Medication Sig Dispense Refill  . amLODipine (NORVASC) 2.5 MG tablet Take 1 tablet (2.5 mg total) by mouth daily. 90 tablet 3  . Ascorbic Acid (VITAMIN C) 1000 MG tablet Take 1,000 mg by mouth daily.    [name] Kitchen azelastine (ASTELIN) 0.1 % nasal spray Place 2 sprays into both nostrils daily. Reported on 05/07/2015 30 mL 3  . bisacodyl (DULCOLAX) 5 MG EC tablet Take 5 mg by mouth daily as needed for moderate constipation. Dulcolax 5 mg bowel prep #4-Take as directed    . butalbital-acetaminophen-caffeine (FIORICET, ESGIC) 50-325-40 MG tablet Take 1 tablet by mouth daily as needed. 30 tablet 3  . cyclobenzaprine (FLEXERIL) 10 MG tablet Take 0.5-1 tablets (5-10 mg total) by mouth 3 (three) times daily as needed for muscle spasms. 60 tablet 5  . fexofenadine (ALLEGRA) 180 MG tablet Take 1 tablet (180 mg total) by mouth daily. (Patient taking differently: Take 180 mg by mouth 2 (two) times  daily. ) 90 tablet 3  . fluticasone (FLONASE) 50 MCG/ACT nasal spray USE ONE SPRAY AS DIRECTED ONCE DAILY 48 g 3  . gabapentin (NEURONTIN) 300 MG capsule Take 900 mg by mouth at bedtime.  1  . montelukast (SINGULAIR) 10 MG tablet Take 1 tablet (10 mg total) by mouth at bedtime. 90 tablet 3  . Na Sulfate-K Sulfate-Mg Sulf (SUPREP BOWEL PREP KIT) 17.5-3.13-1.6 GM/177ML SOLN Take 1 kit by mouth once for 1 dose. 354 mL 0  . omeprazole (PRILOSEC) 40 MG capsule Take 1 capsule (40 mg total) by mouth 2 (two) times daily before a meal. 60 capsule 3  . ondansetron (ZOFRAN) 4 MG tablet Take 1 tablet (4 mg total) by mouth every 8 (eight) hours as needed for nausea or vomiting. 30 tablet 3  . PARoxetine (PAXIL) 20 MG tablet TAKE 1 TABLET BY MOUTH EVERYDAY AT BEDTIME 90 tablet 0  . Polyethylene Glycol 3350 (MIRALAX PO) Take by mouth. Miralax 238 gm bowel prep-Take as directed    . PROAIR HFA 108 (90 Base) MCG/ACT inhaler INHALE 2 PUFFS EVERY 6 HOURS AS NEEDED FOR WHEEZING OR SHORTNESS OF BREATH 25.5 Inhaler 2  . promethazine (PHENERGAN) 12.5 MG tablet Take 1 tablet (12.5 mg total) by mouth every 6 (six) hours as needed for nausea or vomiting. 30 tablet 3  . propranolol-hydrochlorothiazide (INDERIDE) 40-25 MG tablet Take 1 tablet by mouth  2 (two) times daily. 180 tablet 3  . simvastatin (ZOCOR) 20 MG tablet Take 1 tablet (20 mg total) by mouth at bedtime. 90 tablet 3  . traMADol (ULTRAM) 50 MG tablet Take 1 tablet (50 mg total) by mouth daily as needed. 30 tablet 2  . VESICARE 5 MG tablet Take 1 tablet by mouth daily.    [name] Kitchen doxycycline (VIBRA-TABS) 100 MG tablet Take 1 tablet (100 mg total) by mouth 2 (two) times daily for 7 days. 14 tablet 0  . predniSONE (DELTASONE) 20 MG tablet Take 2 pills for 3 days, 1 pill for 4 days 10 tablet 0   No current facility-administered medications for this visit.     Objective: BP 138/72 (BP Location: Left Arm, Patient Position: Sitting, Cuff Size: Large)   Pulse (!) 58    Temp 97.9 F (36.6 C) (Oral)   Ht _0  (1.778 m)   Wt 201 lb 3.2 oz (91.3 kg)   SpO2 97%   BMI 28.87 kg/m  Gen: appears fatigued, frequent cough during visit CV: RRR no murmurs rubs or gallops Lungs: CTAB no crackles, wheeze, rhonchi Abdomen: soft/nontender/nondistended/normal bowel sounds. Ext: no edema Skin: warm, dry  Assessment/Plan:  Cough - Plan: DG Chest 2 View  Chest congestion S:  Patient complains of cough for 2 months. States it is productive and seems to be getting worse. Worse at nighttime in particular. Feels congested particularly in left chest. Seems to wheeze more. Not short of breath. No fever. Some nasal congestion. No recent weight gain. No edema A/P: 67 year old with remote smoking history of 1 pack year quitting in 1985, asthma who presents with cough for 2 months. We will get CXR to rule out pneumonia- If PNA would consider augmentin and azithromycin. Could be bronchitis but with how this is lingering would likely treat with antibiotic like doxycycline and prednisone  Addendum: Dg Chest 2 View  Result Date: 09/19/2017 CLINICAL DATA:  Several month history of productive cough with increasing severity especially at night. No associated symptoms. History of asthma, former smoker. EXAM: CHEST - 2 VIEW COMPARISON:  Chest x-ray of October 03, 2013 FINDINGS: The lungs are well-expanded. There is no focal infiltrate. The interstitial markings are coarse especially inferiorly on the right and are more conspicuous than in the past. There is no pleural effusion. The heart and pulmonary vascularity are normal. The mediastinum is normal in width. There is mild multilevel degenerative disc disease of the thoracic spine. IMPRESSION: No acute pneumonia nor CHF. Coarse interstitial lung markings especially inferiorly on the right may reflect the patient's history of reactive airway disease and smoking. Electronically Signed   By: David  Martinique M.D.   On: 09/19/2017 13:29  Findings on  my exam concern me for potential bronchitis- will treat with doxycycline and prednisone for 7 days each- if he fails to improve on this want him to see me back.    Future Appointments  Date Time Provider [place]  10/03/2017  2:30 PM Mauri Pole, MD [facility]  12/20/2017  9:00 AM Marin Olp, MD [facility]  09/26/2018  1:00 PM [facility] HEALTH COACH [facility]   Lab/Order associations: Cough - Plan: DG Chest 2 View  Chest congestion  Meds ordered this encounter  Medications  . doxycycline (VIBRA-TABS) 100 MG tablet    Sig: Take 1 tablet (100 mg total) by mouth 2 (two) times daily for 7 days.    Dispense:  14 tablet    Refill:  0  . predniSONE (DELTASONE) 20 MG tablet    Sig: Take 2 pills for 3 days, 1 pill for 4 days    Dispense:  10 tablet    Refill:  0   Return precautions advised.  Garret Reddish, MD

## 2017-09-19 NOTE — Progress Notes (Signed)
Per pt, no allergies to soy or egg products.Pt not taking any weight loss meds or using  O2 at home.  Pt refused emmi video. 

## 2017-09-19 NOTE — Addendum Note (Signed)
Addended by: Marin Olp on: 09/19/2017 05:45 PM   Modules accepted: Orders, Level of Service

## 2017-09-20 ENCOUNTER — Telehealth: Payer: Self-pay | Admitting: Family Medicine

## 2017-09-20 NOTE — Telephone Encounter (Signed)
Copied from [place] [id number]. Topic: Quick Communication - Lab Results >> Sep 20, 2017  9:37 AM Harriett Rush, CMA wrote: Called patient to inform them of 09/19/2017 lab results. When patient returns call, triage nurse may disclose results.

## 2017-09-21 ENCOUNTER — Telehealth: Payer: Self-pay | Admitting: Gastroenterology

## 2017-09-21 ENCOUNTER — Telehealth: Payer: Self-pay | Admitting: Family Medicine

## 2017-09-21 NOTE — Telephone Encounter (Unsigned)
Copied from [place] [id number]. Topic: Quick Communication - Lab Results >> Sep 20, 2017  9:37 AM Harriett Rush, CMA wrote: Called patient to inform them of 09/19/2017 lab results. When patient returns call, triage nurse may disclose results.

## 2017-09-21 NOTE — Telephone Encounter (Signed)
Discussed prednisone and amoxicillin he is on for an URI he is being treated for. He will also use the Zofran he has to pre-medicate before starting his prep due to nausea.

## 2017-09-21 NOTE — Telephone Encounter (Signed)
See result note.  Information provided to patient.

## 2017-09-28 ENCOUNTER — Telehealth: Payer: Self-pay | Admitting: Gastroenterology

## 2017-09-28 MED ORDER — PROMETHAZINE HCL 12.5 MG PO TABS: 12.5000 mg | ORAL_TABLET | Freq: Four times a day (QID) | ORAL | 3 refills | Status: DC | PRN

## 2017-09-28 NOTE — Telephone Encounter (Signed)
Iv resent this medication phenergan into his pharmacy as a new prescription, we have not filled this since April  Waiting to see if pharmacy sends me a prior auth request

## 2017-09-29 ENCOUNTER — Encounter: Payer: Self-pay | Admitting: Gastroenterology

## 2017-09-29 NOTE — Telephone Encounter (Signed)
Working on through [employer] [facility]

## 2017-10-03 ENCOUNTER — Ambulatory Visit (AMBULATORY_SURGERY_CENTER): Payer: Medicare Other | Admitting: Gastroenterology

## 2017-10-03 ENCOUNTER — Encounter: Payer: Self-pay | Admitting: Gastroenterology

## 2017-10-03 VITALS — BP 124/77 | HR 53 | Temp 99.3°F | Resp 12 | Ht 70.0 in | Wt 202.0 lb

## 2017-10-03 DIAGNOSIS — Z86010 Personal history of colonic polyps: Secondary | ICD-10-CM | POA: Diagnosis not present

## 2017-10-03 DIAGNOSIS — K621 Rectal polyp: Secondary | ICD-10-CM | POA: Diagnosis not present

## 2017-10-03 DIAGNOSIS — J45909 Unspecified asthma, uncomplicated: Secondary | ICD-10-CM | POA: Diagnosis not present

## 2017-10-03 DIAGNOSIS — D125 Benign neoplasm of sigmoid colon: Secondary | ICD-10-CM

## 2017-10-03 DIAGNOSIS — D120 Benign neoplasm of cecum: Secondary | ICD-10-CM

## 2017-10-03 DIAGNOSIS — K635 Polyp of colon: Secondary | ICD-10-CM | POA: Diagnosis not present

## 2017-10-03 DIAGNOSIS — I10 Essential (primary) hypertension: Secondary | ICD-10-CM | POA: Diagnosis not present

## 2017-10-03 DIAGNOSIS — D123 Benign neoplasm of transverse colon: Secondary | ICD-10-CM | POA: Diagnosis not present

## 2017-10-03 DIAGNOSIS — E669 Obesity, unspecified: Secondary | ICD-10-CM | POA: Diagnosis not present

## 2017-10-03 DIAGNOSIS — D124 Benign neoplasm of descending colon: Secondary | ICD-10-CM

## 2017-10-03 DIAGNOSIS — D127 Benign neoplasm of rectosigmoid junction: Secondary | ICD-10-CM | POA: Diagnosis not present

## 2017-10-03 MED ORDER — SODIUM CHLORIDE 0.9 % IV SOLN: 500.0000 mL | Freq: Once | INTRAVENOUS | Status: DC

## 2017-10-03 NOTE — Patient Instructions (Signed)
YOU HAD AN ENDOSCOPIC PROCEDURE TODAY AT THE [facility]:   Refer to the procedure report that was given to you for any specific questions about what was found during the examination.  If the procedure report does not answer your questions, please call your gastroenterologist to clarify.  If you requested that your care partner not be given the details of your procedure findings, then the procedure report has been included in a sealed envelope for you to review at your convenience later.  YOU SHOULD EXPECT: Some feelings of bloating in the abdomen. Passage of more gas than usual.  Walking can help get rid of the air that was put into your GI tract during the procedure and reduce the bloating. If you had a lower endoscopy (such as a colonoscopy or flexible sigmoidoscopy) you may notice spotting of blood in your stool or on the toilet paper. If you underwent a bowel prep for your procedure, you may not have a normal bowel movement for a few days.  Please Note:  You might notice some irritation and congestion in your nose or some drainage.  This is from the oxygen used during your procedure.  There is no need for concern and it should clear up in a day or so.  SYMPTOMS TO REPORT IMMEDIATELY:   Following lower endoscopy (colonoscopy or flexible sigmoidoscopy):  Excessive amounts of blood in the stool  Significant tenderness or worsening of abdominal pains  Swelling of the abdomen that is new, acute  Fever of 100F or higher   For urgent or emergent issues, a gastroenterologist can be reached at any hour by calling [phone].   DIET:  We do recommend a small meal at first, but then you may proceed to your regular diet.  Drink plenty of fluids but you should avoid alcoholic beverages for 24 hours.  ACTIVITY:  You should plan to take it easy for the rest of today and you should NOT DRIVE or use heavy machinery until tomorrow (because of the sedation medicines used during the test).     FOLLOW UP: Our staff will call the number listed on your records the next business day following your procedure to check on you and address any questions or concerns that you may have regarding the information given to you following your procedure. If we do not reach you, we will leave a message.  However, if you are feeling well and you are not experiencing any problems, there is no need to return our call.  We will assume that you have returned to your regular daily activities without incident.  If any biopsies were taken you will be contacted by phone or by letter within the next 1-3 weeks.  Please call us at [phone] if you have not heard about the biopsies in 3 weeks.    SIGNATURES/CONFIDENTIALITY: You and/or your care partner have signed paperwork which will be entered into your electronic medical record.  These signatures attest to the fact that that the information above on your After Visit Summary has been reviewed and is understood.  Full responsibility of the confidentiality of this discharge information lies with you and/or your care-partner.   Handouts were given to your care partner on polyps, diverticulosis, and hemorrhoids. You may resume your current medications today. Await biopsy results. Please call if any questions or concerns.   

## 2017-10-03 NOTE — Progress Notes (Signed)
No problems noted in the recovery room. maw 

## 2017-10-03 NOTE — Op Note (Signed)
[place] Patient Name: Mark [name] Procedure Date: 10/03/2017 2:04 PM MRN: [id number] Endoscopist: Mauri Pole , MD Age: 67 Referring MD:  Date of Birth: Jul 03, 1950 Gender: Male Account #: [id number] Procedure:                Colonoscopy Indications:              Surveillance: Personal history of adenomatous                            polyps, inadequate prep on last colonoscopy (less                            than 1 year ago) Medicines:                Monitored Anesthesia Care Procedure:                Pre-Anesthesia Assessment:                           - Prior to the procedure, a History and Physical                            was performed, and patient medications and                            allergies were reviewed. The patient's tolerance of                            previous anesthesia was also reviewed. The risks                            and benefits of the procedure and the sedation                            options and risks were discussed with the patient.                            All questions were answered, and informed consent                            was obtained. Prior Anticoagulants: The patient has                            taken no previous anticoagulant or antiplatelet                            agents. ASA Grade Assessment: II - A patient with                            mild systemic disease. After reviewing the risks                            and benefits, the patient was deemed in  satisfactory condition to undergo the procedure.                           After obtaining informed consent, the colonoscope                            was passed under direct vision. Throughout the                            procedure, the patient's blood pressure, pulse, and                            oxygen saturations were monitored continuously. The                            Colonoscope was introduced through the  anus and                            advanced to the the cecum, identified by                            appendiceal orifice and ileocecal valve. The                            colonoscopy was performed without difficulty. The                            patient tolerated the procedure well. The quality                            of the bowel preparation was excellent. The                            ileocecal valve, appendiceal orifice, and rectum                            were photographed. Scope In: 2:09:47 PM Scope Out: 2:29:43 PM Scope Withdrawal Time: 0 hours 15 minutes 17 seconds  Total Procedure Duration: 0 hours 19 minutes 56 seconds  Findings:                 The perianal and digital rectal examinations were                            normal.                           Two sessile polyps were found in the sigmoid colon                            and transverse colon. The polyps were 5 to 7 mm in                            size. These polyps were removed with a cold snare.  Resection and retrieval were complete.                           Four sessile polyps were found in the rectum,                            descending colon and cecum. The polyps were 1 to 2                            mm in size. These polyps were removed with a cold                            biopsy forceps. Resection and retrieval were                            complete.                           Scattered small-mouthed diverticula were found in                            the sigmoid colon.                           Non-bleeding internal hemorrhoids were found during                            retroflexion. The hemorrhoids were small. Complications:            No immediate complications. Estimated Blood Loss:     Estimated blood loss was minimal. Impression:               - Two 5 to 7 mm polyps in the sigmoid colon and in                            the transverse colon, removed with a  cold snare.                            Resected and retrieved.                           - Four 1 to 2 mm polyps in the rectum, in the                            descending colon and in the cecum, removed with a                            cold biopsy forceps. Resected and retrieved.                           - Mild diverticulosis in the sigmoid colon.                           - Non-bleeding internal hemorrhoids. Recommendation:           -  Patient has a contact number available for                            emergencies. The signs and symptoms of potential                            delayed complications were discussed with the                            patient. Return to normal activities tomorrow.                            Written discharge instructions were provided to the                            patient.                           - Resume previous diet.                           - Continue present medications.                           - Await pathology results.                           - Repeat colonoscopy in 3 years for surveillance                            based on pathology results. Mauri Pole, MD 10/03/2017 2:35:34 PM This report has been signed electronically.

## 2017-10-03 NOTE — Progress Notes (Signed)
Called to room to assist during endoscopic procedure.  Patient ID and intended procedure confirmed with present staff. Received instructions for my participation in the procedure from the performing physician.  

## 2017-10-03 NOTE — Progress Notes (Signed)
Report to PACU, RN, vss, BBS= Clear.  

## 2017-10-04 ENCOUNTER — Telehealth: Payer: Self-pay | Admitting: *Deleted

## 2017-10-04 NOTE — Telephone Encounter (Signed)
  Follow up Call-  Call back number 10/03/2017 08/21/2017  Post procedure Call Back phone  # [phone]  Permission to leave phone message Yes Yes  Some recent data might be hidden     Patient questions:  Do you have a fever, pain , or abdominal swelling? No. Pain Score  0 *  Have you tolerated food without any problems? Yes.    Have you been able to return to your normal activities? Yes.    Do you have any questions about your discharge instructions: Diet   No. Medications  No. Follow up visit  No.  Do you have questions or concerns about your Care? No.  Actions: * If pain score is 4 or above: No action needed, pain <4.

## 2017-10-12 ENCOUNTER — Encounter: Payer: Self-pay | Admitting: Gastroenterology

## 2017-12-04 ENCOUNTER — Other Ambulatory Visit: Payer: Self-pay | Admitting: Family Medicine

## 2017-12-20 ENCOUNTER — Encounter: Payer: Self-pay | Admitting: Family Medicine

## 2017-12-20 ENCOUNTER — Ambulatory Visit (INDEPENDENT_AMBULATORY_CARE_PROVIDER_SITE_OTHER): Payer: Medicare Other | Admitting: Family Medicine

## 2017-12-20 VITALS — BP 126/74 | HR 57 | Temp 98.3°F | Ht 70.0 in | Wt 203.6 lb

## 2017-12-20 DIAGNOSIS — J4520 Mild intermittent asthma, uncomplicated: Secondary | ICD-10-CM

## 2017-12-20 DIAGNOSIS — G8929 Other chronic pain: Secondary | ICD-10-CM

## 2017-12-20 DIAGNOSIS — E785 Hyperlipidemia, unspecified: Secondary | ICD-10-CM | POA: Diagnosis not present

## 2017-12-20 DIAGNOSIS — M5442 Lumbago with sciatica, left side: Secondary | ICD-10-CM | POA: Diagnosis not present

## 2017-12-20 DIAGNOSIS — J61 Pneumoconiosis due to asbestos and other mineral fibers: Secondary | ICD-10-CM | POA: Diagnosis not present

## 2017-12-20 DIAGNOSIS — Z23 Encounter for immunization: Secondary | ICD-10-CM | POA: Diagnosis not present

## 2017-12-20 DIAGNOSIS — K219 Gastro-esophageal reflux disease without esophagitis: Secondary | ICD-10-CM

## 2017-12-20 DIAGNOSIS — I10 Essential (primary) hypertension: Secondary | ICD-10-CM | POA: Diagnosis not present

## 2017-12-20 MED ORDER — ATORVASTATIN CALCIUM 20 MG PO TABS: 20.0000 mg | ORAL_TABLET | Freq: Every day | ORAL | 3 refills | Status: DC

## 2017-12-20 MED ORDER — OMEPRAZOLE 40 MG PO CPDR: 40.0000 mg | DELAYED_RELEASE_CAPSULE | Freq: Two times a day (BID) | ORAL | 2 refills | Status: DC

## 2017-12-20 NOTE — Progress Notes (Signed)
Subjective:  Mark [name] is a 67 y.o. year old very pleasant male patient who presents for/with See problem oriented charting ROS- continued low back pain. No chest pain or shortness of breath. No headache or blurry vision.   Past Medical History-  Patient Active Problem List   Diagnosis Date Noted  . Asbestosis ([place]) 02/05/2016    Priority: High  . Back pain 10/09/2013    Priority: High  . Anxiety state 09/01/2009    Priority: High  . Migraine headache 03/07/2007    Priority: High  . Gastroparesis 05/01/2009    Priority: Medium  . GERD 04/02/2009    Priority: Medium  . Hyperlipidemia 06/11/2007    Priority: Medium  . Asthma 03/12/2007    Priority: Medium  . Essential hypertension 03/07/2007    Priority: Medium  . PROSTATE CANCER, HX OF 03/07/2007    Priority: Medium  . Allergic rhinitis 11/22/2013    Priority: Low  . Overweight(278.02) 01/10/2012    Priority: Low  . HIATAL HERNIA 04/28/2009    Priority: Low  . History of adenomatous polyp of colon 09/08/2017    Medications- reviewed and updated Current Outpatient Medications  Medication Sig Dispense Refill  . amLODipine (NORVASC) 2.5 MG tablet TAKE 1 TABLET BY MOUTH EVERY DAY 90 tablet 1  . azelastine (ASTELIN) 0.1 % nasal spray Place 2 sprays into both nostrils daily. Reported on 05/07/2015 30 mL 3  . butalbital-acetaminophen-caffeine (FIORICET, ESGIC) 50-325-40 MG tablet Take 1 tablet by mouth daily as needed. 30 tablet 3  . cyclobenzaprine (FLEXERIL) 10 MG tablet Take 0.5-1 tablets (5-10 mg total) by mouth 3 (three) times daily as needed for muscle spasms. 60 tablet 5  . fexofenadine (ALLEGRA) 180 MG tablet Take 1 tablet (180 mg total) by mouth daily. (Patient taking differently: Take 180 mg by mouth 2 (two) times daily. ) 90 tablet 3  . fluticasone (FLONASE) 50 MCG/ACT nasal spray USE ONE SPRAY AS DIRECTED ONCE DAILY 48 g 3  . gabapentin (NEURONTIN) 300 MG capsule Take 900 mg by mouth at bedtime.  1  .  montelukast (SINGULAIR) 10 MG tablet Take 1 tablet (10 mg total) by mouth at bedtime. 90 tablet 3  . omeprazole (PRILOSEC) 40 MG capsule Take 1 capsule (40 mg total) by mouth 2 (two) times daily before a meal. 60 capsule 3  . ondansetron (ZOFRAN) 4 MG tablet Take 1 tablet (4 mg total) by mouth every 8 (eight) hours as needed for nausea or vomiting. 30 tablet 3  . PARoxetine (PAXIL) 20 MG tablet TAKE 1 TABLET BY MOUTH EVERYDAY AT BEDTIME 90 tablet 0  . Polyethylene Glycol 3350 (MIRALAX PO) Take by mouth. Miralax 238 gm bowel prep-Take as directed    . PROAIR HFA 108 (90 Base) MCG/ACT inhaler INHALE 2 PUFFS EVERY 6 HOURS AS NEEDED FOR WHEEZING OR SHORTNESS OF BREATH 25.5 Inhaler 2  . promethazine (PHENERGAN) 12.5 MG tablet Take 1 tablet (12.5 mg total) by mouth every 6 (six) hours as needed for nausea or vomiting. 30 tablet 3  . propranolol-hydrochlorothiazide (INDERIDE) 40-25 MG tablet Take 1 tablet by mouth 2 (two) times daily. 180 tablet 3  . simvastatin (ZOCOR) 20 MG tablet Take 1 tablet (20 mg total) by mouth at bedtime. 90 tablet 3  . traMADol (ULTRAM) 50 MG tablet Take 1 tablet (50 mg total) by mouth daily as needed. 30 tablet 2  . VESICARE 5 MG tablet Take 1 tablet by mouth daily.      Objective: BP 126/74 (  BP Location: Left Arm, Patient Position: Sitting, Cuff Size: Normal)   Pulse (!) 57   Temp 98.3 F (36.8 C) (Oral)   Ht 5\' 10"  (1.778 m)   Wt 203 lb 9.6 oz (92.4 kg)   SpO2 99%   BMI 29.21 kg/m  Gen: NAD, resting comfortably CV: RRR no murmurs rubs or gallops Lungs: CTAB no crackles, wheeze, rhonchi Ext: no edema Skin: warm, dry Neuro: speech normal, moves all extremities  Assessment/Plan:  Other notes: 1.Follows with Dr. Alinda Money for PSA and rectal exams. On vesicare 2. I think phq9 was input wrong last visit- input full thing and 0 today. paxil mainly for anxiety  Asbestosis [facility]) S: Asbestosis diagnosed by Dr. Charlett Blake in [facility].  They have  reviewed with patient no effective medical treatment for this.  CT scan at visit in August was at least stable.  They have suggested yearly x-rays to make sure there is not progression of scarring.  Likely patient is not a smoker.  He is noted to have class II moderate impairment.  Patient denies chest pain or shortness of breath today.  Cough he reported in July is no longer reported today after course of prednisone and doxycycline A/P: Continue to monitor with next x-ray likely August 2020 or later.  Hyperlipidemia S: Reasonably controlled on simvastatin 20 mg with last LDL at 72 in April A/P: Stable- but with coronary atherosclerosis on CT for asbestosis- will advance to atorvastatin 20mg  to push LDL under 70  Essential hypertension S: controlled on amlodipine 2.5 mg, propranolol HCTZ 40-25 mg BP Readings from Last 3 Encounters:  12/20/17 126/74  10/03/17 124/77  09/19/17 138/72  A/P:  blood pressure goal of <140/90. Continue current meds  Asthma S: Patient remains on Singulair.  Also on Allegra and Astelin.  Has Flonase if symptoms worsens- has had to use recently.  As long as allergies are controlled uses albuterol only 1-2 times a week. A/P: stable- continue current meds   Back pain S: Patient continues to suffer from chronic back pain.  Pain in the low back.  He takes tramadol once a day as well as Flexeril.  On top days-uses 900 mg gabapentin at bedtime.  Firm table or bed does help the pain still. Tramadol has recently been prescribed by Dr. Rolena Infante. They have also prescribed gabapentin lately.   Before Flexeril-he had required Mobic and Valium.  Valium actually have been listed under GIs care for anxiety/stomach issues- he should follow-up with Dr. Silverio Decamp now as needed for this concern.  A/P: stable chronic back pain- continue current meds - would be willing to refill tramadol but would need UDS, controlled substance contract and review [place]  Future Appointments  Date Time  Provider [place]  06/21/2018  8:00 AM Marin Olp, MD [facility]  09/26/2018  1:00 PM [facility] HEALTH COACH [facility]   Return in about 6 months (around 06/21/2018) for follow up- or sooner if needed.  Lab/Order associations: Need for prophylactic vaccination against Streptococcus pneumoniae (pneumococcus) - Plan: Pneumococcal polysaccharide vaccine 23-valent greater than or equal to 2yo subcutaneous/IM  Encounter for immunization - Plan: Flu vaccine HIGH DOSE PF  Asbestosis ([place])  Hyperlipidemia, unspecified hyperlipidemia type  Essential hypertension  Mild intermittent asthma without complication  Chronic bilateral low back pain with left-sided sciatica  Meds ordered this encounter  Medications  . omeprazole (PRILOSEC) 40 MG capsule    Sig: Take 1 capsule (40 mg total) by mouth 2 (two) times daily  before a meal.    Dispense:  60 capsule    Refill:  2  . atorvastatin (LIPITOR) 20 MG tablet    Sig: Take 1 tablet (20 mg total) by mouth daily.    Dispense:  90 tablet    Refill:  3    Return precautions advised.  Garret Reddish, MD

## 2017-12-20 NOTE — Patient Instructions (Addendum)
Health Maintenance Due  Topic Date Due  . INFLUENZA VACCINE - today 09/28/2017  . PNA vac Low Risk Adult (2 of 2 - PPSV23)- pneumovax 23 today 12/07/2017   Gave you 3 months of omeprazole- try to get in with Dr. Silverio Decamp of GI before this runs out- on high dose so want to see if she wants to continue at high dose or lower the dose  Labs looked so good last time- we can hold off until next visit in 6 months  Stop simvastatin. Start atorvastatin 20mg - I sent this in for you today.

## 2017-12-20 NOTE — Assessment & Plan Note (Signed)
S: Patient continues to suffer from chronic back pain.  Pain in the low back.  He takes tramadol once a day as well as Flexeril.  On top days-uses 900 mg gabapentin at bedtime.  Firm table or bed does help the pain still. Tramadol has recently been prescribed by Dr. Rolena Infante. They have also prescribed gabapentin lately.   Before Flexeril-he had required Mobic and Valium.  Valium actually have been listed under GIs care for anxiety/stomach issues- he should follow-up with Dr. Silverio Decamp now as needed for this concern.  A/P: stable chronic back pain- continue current meds - would be willing to refill tramadol but would need [place]DS, controlled substance contract and [place]

## 2017-12-20 NOTE — Assessment & Plan Note (Signed)
Gave you 3 months of omeprazole- try to get in with Dr. Silverio Decamp of GI before this runs out- on high dose so want to see if she wants to continue at high dose or lower the dose - I would want her guidelines for cutting medicine back down if she would prefer for me to rx long term

## 2017-12-20 NOTE — Assessment & Plan Note (Signed)
S: Patient remains on Singulair.  Also on Allegra and Astelin.  Has Flonase if symptoms worsens- has had to use recently.  As long as allergies are controlled uses albuterol only 1-2 times a week. A/P: stable- continue current meds

## 2017-12-20 NOTE — Assessment & Plan Note (Signed)
S: controlled on amlodipine 2.5 mg, propranolol HCTZ 40-25 mg BP Readings from Last 3 Encounters:  12/20/17 126/74  10/03/17 124/77  09/19/17 138/72  A/P:  blood pressure goal of <140/90. Continue current meds

## 2017-12-20 NOTE — Assessment & Plan Note (Signed)
S: Reasonably controlled on simvastatin 20 mg with last LDL at 72 in April A/P: Stable- but with coronary atherosclerosis on CT for asbestosis- will advance to atorvastatin 20mg  to push LDL under 70

## 2017-12-20 NOTE — Assessment & Plan Note (Signed)
S: Asbestosis diagnosed by Dr. Charlett Blake in [facility].  They have reviewed with patient no effective medical treatment for this.  CT scan at visit in August was at least stable.  They have suggested yearly x-rays to make sure there is not progression of scarring.  Likely patient is not a smoker.  He is noted to have class II moderate impairment.  Patient denies chest pain or shortness of breath today.  Cough he reported in July is no longer reported today after course of prednisone and doxycycline A/P: Continue to monitor with next x-ray likely August 2020 or later.

## 2018-03-15 ENCOUNTER — Other Ambulatory Visit: Payer: Self-pay | Admitting: Family Medicine

## 2018-05-21 ENCOUNTER — Other Ambulatory Visit: Payer: Self-pay | Admitting: Family Medicine

## 2018-05-21 NOTE — Telephone Encounter (Signed)
Last OV 12/20/2017 Last refill 03/30/2017 #30/3 Next OV 06/21/2018  Forwarding to Dr. Yong Channel

## 2018-06-14 ENCOUNTER — Other Ambulatory Visit: Payer: Self-pay | Admitting: Family Medicine

## 2018-06-20 ENCOUNTER — Encounter: Payer: Self-pay | Admitting: Family Medicine

## 2018-06-20 NOTE — Patient Instructions (Addendum)
Health Maintenance Due  Topic Date Due  . TETANUS/TDAP Pt was told to have this done at his local pharmacy  06/18/2018   Video visit

## 2018-06-20 NOTE — Progress Notes (Signed)
Phone [phone]   Subjective:  Virtual visit via Video note. Chief complaint: Chief Complaint  Patient presents with  . Follow-up- hypertension    6 month f/u   This visit type was conducted due to national recommendations for restrictions regarding the COVID-19 Pandemic (e.g. social distancing).  This format is felt to be most appropriate for this patient at this time balancing risks to patient and risks to population by having him in for in person visit.  No physical exam was performed (except for noted visual exam or audio findings with Telehealth visits).    Our team/I connected with Mark [name] on 06/21/18 at  8:00 AM EDT by a video enabled telemedicine application ([email or web address]) and verified that I am speaking with the correct person using two identifiers.  Location patient: [place] Location provider: [facility], office Persons participating in the virtual visit:  patient  Our team/I discussed the limitations of evaluation and management by telemedicine and the availability of in person appointments. In light of current covid-19 pandemic, patient also understands that we are trying to protect them by minimizing in office contact if at all possible.  The patient expressed consent for telemedicine visit and agreed to proceed. Patient understands insurance will be billed.   ROS-  some allergies, No chest pain or shortness of breath. No headache or blurry vision. Some wheezing but controlled with inhaler   Past Medical History-  Patient Active Problem List   Diagnosis Date Noted  . Asbestosis ([place]) 02/05/2016    Priority: High  . Back pain 10/09/2013    Priority: High  . Anxiety state 09/01/2009    Priority: High  . Migraine headache 03/07/2007    Priority: High  . Gastroparesis 05/01/2009    Priority: Medium  . GERD 04/02/2009    Priority: Medium  . Hyperlipidemia 06/11/2007    Priority: Medium  . Asthma 03/12/2007    Priority: Medium  . Essential hypertension  03/07/2007    Priority: Medium  . PROSTATE CANCER, HX OF 03/07/2007    Priority: Medium  . Allergic rhinitis 11/22/2013    Priority: Low  . Overweight 01/10/2012    Priority: Low  . HIATAL HERNIA 04/28/2009    Priority: Low  . History of adenomatous polyp of colon 09/08/2017    Medications- reviewed and updated Current Outpatient Medications  Medication Sig Dispense Refill  . amLODipine (NORVASC) 2.5 MG tablet TAKE 1 TABLET BY MOUTH EVERY DAY 90 tablet 1  . atorvastatin (LIPITOR) 20 MG tablet Take 1 tablet (20 mg total) by mouth daily. (Patient taking differently: Take 20 mg by mouth daily. Pt is taking at bedtime) 90 tablet 3  . azelastine (ASTELIN) 0.1 % nasal spray PLACE 2 SPRAYS INTO BOTH NOSTRILS DAILY. REPORTED ON 05/07/2015 30 mL 3  . butalbital-acetaminophen-caffeine (FIORICET, ESGIC) 50-325-40 MG tablet TAKE 1 TABLET BY MOUTH DAILY AS NEEDED (PRIOR AUTH) 10 tablet 5  . cyclobenzaprine (FLEXERIL) 10 MG tablet Take 0.5-1 tablets (5-10 mg total) by mouth 3 (three) times daily as needed for muscle spasms. 60 tablet 5  . fexofenadine (ALLEGRA) 180 MG tablet TAKE 1 TABLET BY MOUTH EVERY DAY (Patient taking differently: Patient is taking 2 times daily) 90 tablet 1  . fluticasone (FLONASE) 50 MCG/ACT nasal spray USE ONE SPRAY AS DIRECTED ONCE DAILY 48 g 3  . gabapentin (NEURONTIN) 300 MG capsule Take 900 mg by mouth at bedtime.  1  . montelukast (SINGULAIR) 10 MG tablet TAKE 1 TABLET BY MOUTH EVERYDAY AT BEDTIME 90 tablet  3  . omeprazole (PRILOSEC) 40 MG capsule Take 1 capsule (40 mg total) by mouth 2 (two) times daily before a meal. 60 capsule 2  . ondansetron (ZOFRAN) 4 MG tablet Take 1 tablet (4 mg total) by mouth every 8 (eight) hours as needed for nausea or vomiting. 30 tablet 3  . PARoxetine (PAXIL) 20 MG tablet TAKE 1 TABLET BY MOUTH EVERYDAY AT BEDTIME 90 tablet 0  . Polyethylene Glycol 3350 (MIRALAX PO) Take by mouth. Miralax 238 gm bowel prep-Take as directed    . PROAIR HFA 108  (90 Base) MCG/ACT inhaler INHALE 2 PUFFS EVERY 6 HOURS AS NEEDED FOR WHEEZING OR SHORTNESS OF BREATH 25.5 Inhaler 2  . promethazine (PHENERGAN) 12.5 MG tablet Take 1 tablet (12.5 mg total) by mouth every 6 (six) hours as needed for nausea or vomiting. 30 tablet 3  . propranolol-hydrochlorothiazide (INDERIDE) 40-25 MG tablet TAKE 1 TABLET BY MOUTH TWICE A DAY 180 tablet 1  . traMADol (ULTRAM) 50 MG tablet Take 1 tablet (50 mg total) by mouth daily as needed. 30 tablet 2  . VESICARE 5 MG tablet Take 1 tablet by mouth daily.       Objective:  BP 120/70 Comment: self reported vitals  Temp 97.6 F (36.4 C)   Ht 5\' 10"  (1.778 m)   Wt 201 lb (91.2 kg)   BMI 28.84 kg/m  Gen: NAD, resting comfortably Lungs: nonlabored, normal respiratory rate  Skin: appears dry, no obvious rash Nondistended abdomen Normal speech and moves upper extremities normally, no facial droop     Assessment and Plan   #hypertension S: controlled on amlodipine 2.5mg , propanolol-hctz 25 mg BP Readings from Last 3 Encounters:  06/21/18 120/70  12/20/17 126/74  10/03/17 124/77  A/P: Stable. Continue current medications.   #hyperlipidemia/overweight S: previously controlled on atorvastatin 20mg  Lab Results  Component Value Date   CHOL 144 06/26/2017   HDL 38.00 (L) 06/26/2017   LDLCALC 72 06/26/2017   LDLDIRECT 75.0 08/07/2015   TRIG 169.0 (H) 06/26/2017   CHOLHDL 4 06/26/2017   A/P:  LDL reasonably controlled on last check- we opted instead of updating labs today to push this out 4 months-he is going to call in for follow-up.  This is in light of COVID-19 pandemic. -We did discuss trying to work on weight-he wants to get down to 190. Encouraged need for healthy eating, regular exercise, weight loss.  Hopefully we can even get LDL under 70 with these changes  # GERD S:taking prilosec 40mg  twice a day- controlling reflux. He has tried coming off but gets bad reflux  A/P: well controlled but we are going to try  omeprazole just once a day for now and see if that controls symptoms  -if gastroparesis worsens-may have to long-term continue high-dose No results found for: VITAMINB12 -Consider B12 at follow-up given long-term PPI use  # Back pain S:tramadol is helpful for his back- gets radiation down into leg as well. somedays doesn't need it at all- if really active needs it more  A/P: Stable. Continue current medications.  Refilled tramadol   #asbestosis- patient is continuing to need prn albuterol particularly if outside a lot. He also has known asthma though which could be primary cause (also on singulair). Also on astelin, flonase, antihistamine for allergies. Issues stable- continue current medicines  4 months in person- possibly convert wellness visit to visit with me that is scheduled on 09/26/2018 (has to be this date or later) Future Appointments  Date Time Provider  [place]  10/22/2018  9:40 AM Yong Channel, [name], MD [facility]   Lab/Order associations: Chronic bilateral low back pain with left-sided sciatica  Asbestosis ([place])  Hyperlipidemia, unspecified hyperlipidemia type  Gastroesophageal reflux disease, esophagitis presence not specified  Essential hypertension  Gastroparesis  Overweight  BMI 28.0-28.9,adult  Meds ordered this encounter  Medications  . omeprazole (PRILOSEC) 40 MG capsule    Sig: Take 1 capsule (40 mg total) by mouth 2 (two) times daily before a meal.    Dispense:  180 capsule    Refill:  1  . traMADol (ULTRAM) 50 MG tablet    Sig: Take 1 tablet (50 mg total) by mouth daily as needed.    Dispense:  30 tablet    Refill:  2  . promethazine (PHENERGAN) 12.5 MG tablet    Sig: Take 1 tablet (12.5 mg total) by mouth every 6 (six) hours as needed for nausea or vomiting.    Dispense:  30 tablet    Refill:  3  . ondansetron (ZOFRAN) 4 MG tablet    Sig: Take 1 tablet (4 mg total) by mouth every 8 (eight) hours as needed for nausea or vomiting.     Dispense:  30 tablet    Refill:  3   Return precautions advised.  Garret Reddish, MD

## 2018-06-21 ENCOUNTER — Ambulatory Visit (INDEPENDENT_AMBULATORY_CARE_PROVIDER_SITE_OTHER): Payer: Medicare Other | Admitting: Family Medicine

## 2018-06-21 ENCOUNTER — Encounter: Payer: Self-pay | Admitting: Family Medicine

## 2018-06-21 VITALS — BP 120/70 | Temp 97.6°F | Ht 70.0 in | Wt 201.0 lb

## 2018-06-21 DIAGNOSIS — Z6828 Body mass index (BMI) 28.0-28.9, adult: Secondary | ICD-10-CM | POA: Diagnosis not present

## 2018-06-21 DIAGNOSIS — J61 Pneumoconiosis due to asbestos and other mineral fibers: Secondary | ICD-10-CM

## 2018-06-21 DIAGNOSIS — G8929 Other chronic pain: Secondary | ICD-10-CM | POA: Diagnosis not present

## 2018-06-21 DIAGNOSIS — E663 Overweight: Secondary | ICD-10-CM

## 2018-06-21 DIAGNOSIS — K3184 Gastroparesis: Secondary | ICD-10-CM

## 2018-06-21 DIAGNOSIS — K219 Gastro-esophageal reflux disease without esophagitis: Secondary | ICD-10-CM

## 2018-06-21 DIAGNOSIS — M5442 Lumbago with sciatica, left side: Secondary | ICD-10-CM

## 2018-06-21 DIAGNOSIS — I10 Essential (primary) hypertension: Secondary | ICD-10-CM | POA: Diagnosis not present

## 2018-06-21 DIAGNOSIS — E785 Hyperlipidemia, unspecified: Secondary | ICD-10-CM

## 2018-06-21 MED ORDER — ONDANSETRON HCL 4 MG PO TABS: 4.0000 mg | ORAL_TABLET | Freq: Three times a day (TID) | ORAL | 3 refills | Status: DC | PRN

## 2018-06-21 MED ORDER — TRAMADOL HCL 50 MG PO TABS: 50.0000 mg | ORAL_TABLET | Freq: Every day | ORAL | 2 refills | Status: DC | PRN

## 2018-06-21 MED ORDER — OMEPRAZOLE 40 MG PO CPDR: 40.0000 mg | DELAYED_RELEASE_CAPSULE | Freq: Two times a day (BID) | ORAL | 1 refills | Status: DC

## 2018-06-21 MED ORDER — PROMETHAZINE HCL 12.5 MG PO TABS: 12.5000 mg | ORAL_TABLET | Freq: Four times a day (QID) | ORAL | 3 refills | Status: DC | PRN

## 2018-06-22 ENCOUNTER — Other Ambulatory Visit: Payer: Self-pay | Admitting: Family Medicine

## 2018-08-27 ENCOUNTER — Other Ambulatory Visit: Payer: Self-pay | Admitting: Family Medicine

## 2018-08-28 NOTE — Telephone Encounter (Signed)
Pt called and stated that the pharmacy advised they have sent several request for his medication with no response. Pt wanted to check if the request were received for his refill

## 2018-08-28 NOTE — Telephone Encounter (Signed)
See note

## 2018-08-28 NOTE — Telephone Encounter (Signed)
Last OV 06/21/18 Last refill 12/07/16 #60/5 Next OV 10/22/18

## 2018-08-28 NOTE — Telephone Encounter (Signed)
Called pt and advised rx has been refilled.

## 2018-09-05 ENCOUNTER — Other Ambulatory Visit: Payer: Self-pay | Admitting: Family Medicine

## 2018-09-12 DIAGNOSIS — Z8546 Personal history of malignant neoplasm of prostate: Secondary | ICD-10-CM | POA: Diagnosis not present

## 2018-09-17 ENCOUNTER — Other Ambulatory Visit: Payer: Self-pay

## 2018-09-17 DIAGNOSIS — Z20822 Contact with and (suspected) exposure to covid-19: Secondary | ICD-10-CM

## 2018-09-19 DIAGNOSIS — Z8546 Personal history of malignant neoplasm of prostate: Secondary | ICD-10-CM | POA: Diagnosis not present

## 2018-09-19 DIAGNOSIS — R350 Frequency of micturition: Secondary | ICD-10-CM | POA: Diagnosis not present

## 2018-09-19 DIAGNOSIS — N5201 Erectile dysfunction due to arterial insufficiency: Secondary | ICD-10-CM | POA: Diagnosis not present

## 2018-09-20 LAB — NOVEL CORONAVIRUS, NAA: SARS-CoV-2, NAA: NOT DETECTED

## 2018-09-26 ENCOUNTER — Ambulatory Visit: Payer: Medicare Other

## 2018-10-17 NOTE — Progress Notes (Signed)
Phone [phone]   Subjective:  Mark [name] is a 68 y.o. year old very pleasant male patient who presents for/with See problem oriented charting Chief Complaint  Patient presents with  . Follow-up  . Hypertension  . Hyperlipidemia  . Gastroesophageal Reflux  . Asthma  . Migraine  . Anxiety   ROS- still having migraines. Occasional shortness of breath and wheeze resolves with albuterol. No chest pain. No edema.    Past Medical History-  Patient Active Problem List   Diagnosis Date Noted  . Asbestosis ([place]) 02/05/2016    Priority: High  . Back pain 10/09/2013    Priority: High  . Anxiety state 09/01/2009    Priority: High  . Migraine headache 03/07/2007    Priority: High  . Gastroparesis 05/01/2009    Priority: Medium  . GERD 04/02/2009    Priority: Medium  . Hyperlipidemia 06/11/2007    Priority: Medium  . Asthma 03/12/2007    Priority: Medium  . Essential hypertension 03/07/2007    Priority: Medium  . PROSTATE CANCER, HX OF 03/07/2007    Priority: Medium  . Allergic rhinitis 11/22/2013    Priority: Low  . Overweight 01/10/2012    Priority: Low  . HIATAL HERNIA 04/28/2009    Priority: Low  . History of adenomatous polyp of colon 09/08/2017    Medications- reviewed and updated Current Outpatient Medications  Medication Sig Dispense Refill  . amLODipine (NORVASC) 2.5 MG tablet TAKE 1 TABLET BY MOUTH EVERY DAY 90 tablet 1  . atorvastatin (LIPITOR) 20 MG tablet Take 1 tablet (20 mg total) by mouth daily. (Patient taking differently: Take 20 mg by mouth daily. Pt is taking at bedtime) 90 tablet 3  . azelastine (ASTELIN) 0.1 % nasal spray PLACE 2 SPRAYS INTO BOTH NOSTRILS DAILY 30 mL 1  . butalbital-acetaminophen-caffeine (FIORICET) 50-325-40 MG tablet TAKE 1 TABLET BY MOUTH DAILY AS NEEDED 10 tablet 5  . cyclobenzaprine (FLEXERIL) 10 MG tablet TAKE 0.5-1 TABLETS BY MOUTH 3 (THREE) TIMES DAILY AS NEEDED FOR MUSCLE SPASMS. 30 tablet 5  . fexofenadine  (ALLEGRA) 180 MG tablet TAKE 1 TABLET BY MOUTH EVERY DAY (Patient taking differently: Patient is taking 2 times daily) 90 tablet 1  . fluticasone (FLONASE) 50 MCG/ACT nasal spray USE ONE SPRAY AS DIRECTED ONCE DAILY 16 g 2  . gabapentin (NEURONTIN) 300 MG capsule Take 900 mg by mouth at bedtime.  1  . montelukast (SINGULAIR) 10 MG tablet TAKE 1 TABLET BY MOUTH EVERYDAY AT BEDTIME 90 tablet 3  . omeprazole (PRILOSEC) 40 MG capsule Take 1 capsule (40 mg total) by mouth daily. 90 capsule 3  . ondansetron (ZOFRAN) 4 MG tablet Take 1 tablet (4 mg total) by mouth every 8 (eight) hours as needed for nausea or vomiting. 30 tablet 3  . PARoxetine (PAXIL) 20 MG tablet TAKE 1 TABLET BY MOUTH EVERYDAY AT BEDTIME 90 tablet 0  . Polyethylene Glycol 3350 (MIRALAX PO) Take by mouth. Miralax 238 gm bowel prep-Take as directed    . PROAIR HFA 108 (90 Base) MCG/ACT inhaler INHALE 2 PUFFS EVERY 6 HOURS AS NEEDED FOR WHEEZING OR SHORTNESS OF BREATH 25.5 Inhaler 2  . promethazine (PHENERGAN) 12.5 MG tablet Take 1 tablet (12.5 mg total) by mouth every 6 (six) hours as needed for nausea or vomiting. 30 tablet 3  . propranolol-hydrochlorothiazide (INDERIDE) 40-25 MG tablet TAKE 1 TABLET BY MOUTH TWICE A DAY 180 tablet 1  . traMADol (ULTRAM) 50 MG tablet Take 1 tablet (50 mg total) by  mouth daily as needed. 30 tablet 2  . VESICARE 5 MG tablet Take 1 tablet by mouth daily.     No current facility-administered medications for this visit.      Objective:  BP 122/80 (BP Location: Left Arm, Patient Position: Sitting, Cuff Size: Normal)   Pulse 65   Temp 98.4 F (36.9 C) (Oral)   Ht 5\' 10"  (1.778 m)   Wt 204 lb (92.5 kg)   SpO2 95%   BMI 29.27 kg/m  Gen: NAD, resting comfortably CV: RRR no murmurs rubs or gallops Lungs: CTAB no crackles, wheeze, rhonchi Abdomen: soft/nontender/nondistended/normal bowel sounds.  Ext: no edema Skin: warm, dry    Assessment and Plan   #hypertension S: controlled on Amlodipine 2.5  mg daily and Propranolol-HCTZ 40-25 mg BID. He has tolerated 25 BID without adverse side effects so we will continue BP Readings from Last 3 Encounters:  10/22/18 122/80  06/21/18 120/70  12/20/17 126/74  A/P: Stable. Continue current medications.   #hyperlipidemia S:  controlled on Atorvastatin 20 mg daily on last check Lab Results  Component Value Date   CHOL 144 06/26/2017   HDL 38.00 (L) 06/26/2017   LDLCALC 72 06/26/2017   LDLDIRECT 75.0 08/07/2015   TRIG 169.0 (H) 06/26/2017   CHOLHDL 4 06/26/2017   A/P: hopefully remains controlled- update lipid panel today  # GERD/gastroparesis S:Omeprazole 40 mg BID has been rxed. Dr. Silverio Decamp 05/2016 recommended once daily. 12/20/17 I refilled the BID with advisement to follow up with Dr. Silverio Decamp- last saw GI 05/2017. He fortunately is only taking once a day.   Gastroparesis- remains on paxil and phenergan through GI A/P: good control for most part- occasionally takes twice a day- advised famotidine/pepcid as back up if not controlled on once a day omeprazole.    # Asthma S:ProAir inhaler prn.  Uses about once a week- twice a week if flared up A/P: reasonable control- continue to monitor    # Migraines S:Taking Fioricet prn.  Least in a month typically 3 and most up to 10- he states could use more but we discussed risk of rebound headaches with higher dose. Sometimes doesn't use at all and other times needs more than twice a week A/P: doing reasonabley well- continue current medicine. Propranolol may be making it less frequent.    #History Prostate cancer Dr. Alinda Money- PSA remains undetectable as per 09/19/2018 report  #back pain doing ok on tramadol. No history of seizures  # asbestosis- has follow up with pulmology but got pushed back due to covid he reports  Recommended follow up: 6 months  Lab/Order associations: [place] and [place]   ICD-10-CM   1. Hyperlipidemia, unspecified hyperlipidemia type  E78.5   2. Essential hypertension  I10    3. Gastroesophageal reflux disease, esophagitis presence not specified  K21.9   4. Mild intermittent asthma without complication  P10.25   5. Migraine with aura and without status migrainosus, not intractable  G43.109   6. Asbestosis ([place])  J61    Meds ordered this encounter  Medications  . omeprazole (PRILOSEC) 40 MG capsule    Sig: Take 1 capsule (40 mg total) by mouth daily.    Dispense:  90 capsule    Refill:  3  . butalbital-acetaminophen-caffeine (FIORICET) 50-325-40 MG tablet    Sig: TAKE 1 TABLET BY MOUTH DAILY AS NEEDED    Dispense:  10 tablet    Refill:  5  . traMADol (ULTRAM) 50 MG tablet    Sig: Take 1  tablet (50 mg total) by mouth daily as needed.    Dispense:  30 tablet    Refill:  2   Return precautions advised.  Garret Reddish, MD

## 2018-10-17 NOTE — Patient Instructions (Addendum)
Health Maintenance Due  Topic Date Due  . INFLUENZA VACCINE - today high dose 09/29/2018   advised pepcid/famotidine as back up if not controlled on once a day omeprazole.    Please stop by lab before you go If you do not have mychart- we will call you about results within 5 business days of [place] receiving them.  If you have mychart- we will send your results within 3 business days of [place] receiving them.  If abnormal or we want to clarify a result, we will call or mychart you to make sure you receive the message.  If you have questions or concerns or don't hear within 5-7 days, please send [place] a message or call us.

## 2018-10-22 ENCOUNTER — Encounter: Payer: Self-pay | Admitting: Family Medicine

## 2018-10-22 ENCOUNTER — Other Ambulatory Visit: Payer: Self-pay

## 2018-10-22 ENCOUNTER — Ambulatory Visit (INDEPENDENT_AMBULATORY_CARE_PROVIDER_SITE_OTHER): Payer: Medicare Other | Admitting: Family Medicine

## 2018-10-22 VITALS — BP 122/80 | HR 65 | Temp 98.4°F | Ht 70.0 in | Wt 204.0 lb

## 2018-10-22 DIAGNOSIS — K219 Gastro-esophageal reflux disease without esophagitis: Secondary | ICD-10-CM

## 2018-10-22 DIAGNOSIS — Z79899 Other long term (current) drug therapy: Secondary | ICD-10-CM | POA: Diagnosis not present

## 2018-10-22 DIAGNOSIS — G43109 Migraine with aura, not intractable, without status migrainosus: Secondary | ICD-10-CM

## 2018-10-22 DIAGNOSIS — J61 Pneumoconiosis due to asbestos and other mineral fibers: Secondary | ICD-10-CM | POA: Diagnosis not present

## 2018-10-22 DIAGNOSIS — J4520 Mild intermittent asthma, uncomplicated: Secondary | ICD-10-CM

## 2018-10-22 DIAGNOSIS — E785 Hyperlipidemia, unspecified: Secondary | ICD-10-CM | POA: Diagnosis not present

## 2018-10-22 DIAGNOSIS — I10 Essential (primary) hypertension: Secondary | ICD-10-CM | POA: Diagnosis not present

## 2018-10-22 DIAGNOSIS — Z23 Encounter for immunization: Secondary | ICD-10-CM | POA: Diagnosis not present

## 2018-10-22 LAB — COMPREHENSIVE METABOLIC PANEL
ALT: 25 U/L (ref 0–53)
AST: 19 U/L (ref 0–37)
Albumin: 4.5 g/dL (ref 3.5–5.2)
Alkaline Phosphatase: 88 U/L (ref 39–117)
BUN: 8 mg/dL (ref 6–23)
CO2: 34 mEq/L — ABNORMAL HIGH (ref 19–32)
Calcium: 9.7 mg/dL (ref 8.4–10.5)
Chloride: 99 mEq/L (ref 96–112)
Creatinine, Ser: 0.82 mg/dL (ref 0.40–1.50)
GFR: 113.36 mL/min (ref >60.00)
Glucose, Bld: 89 mg/dL (ref 70–99)
Potassium: 3.7 mEq/L (ref 3.5–5.1)
Sodium: 139 mEq/L (ref 135–145)
Total Bilirubin: 0.7 mg/dL (ref 0.2–1.2)
Total Protein: 6.7 g/dL (ref 6.0–8.3)

## 2018-10-22 LAB — CBC
HCT: 47.7 % (ref 39.0–52.0)
Hemoglobin: 16 g/dL (ref 13.0–17.0)
MCHC: 33.7 g/dL (ref 30.0–36.0)
MCV: 95 fl (ref 78.0–100.0)
Platelets: 286 10*3/uL (ref 150.0–400.0)
RBC: 5.02 Mil/uL (ref 4.22–5.81)
RDW: 12.9 % (ref 11.5–15.5)
WBC: 5.5 10*3/uL (ref 4.0–10.5)

## 2018-10-22 LAB — LIPID PANEL
Cholesterol: 135 mg/dL (ref 0–200)
HDL: 40.8 mg/dL (ref >39.00)
LDL Cholesterol: 60 mg/dL (ref 0–99)
NonHDL: 94.38
Total CHOL/HDL Ratio: 3
Triglycerides: 173 mg/dL — ABNORMAL HIGH (ref 0.0–149.0)
VLDL: 34.6 mg/dL (ref 0.0–40.0)

## 2018-10-22 MED ORDER — ALBUTEROL SULFATE HFA 108 (90 BASE) MCG/ACT IN AERS: INHALATION_SPRAY | RESPIRATORY_TRACT | 3 refills | Status: DC

## 2018-10-22 MED ORDER — TRAMADOL HCL 50 MG PO TABS: 50.0000 mg | ORAL_TABLET | Freq: Every day | ORAL | 2 refills | Status: DC | PRN

## 2018-10-22 MED ORDER — BUTALBITAL-APAP-CAFFEINE 50-325-40 MG PO TABS: ORAL_TABLET | ORAL | 5 refills | Status: DC

## 2018-10-22 MED ORDER — PROPRANOLOL-HCTZ 40-25 MG PO TABS: 1.0000 | ORAL_TABLET | Freq: Two times a day (BID) | ORAL | 1 refills | Status: DC

## 2018-10-22 MED ORDER — OMEPRAZOLE 40 MG PO CPDR: 40.0000 mg | DELAYED_RELEASE_CAPSULE | Freq: Every day | ORAL | 3 refills | Status: DC

## 2018-10-22 NOTE — Addendum Note (Signed)
Addended by: Darral Dash on: 10/22/2018 11:06 AM   Modules accepted: Orders

## 2018-10-22 NOTE — Addendum Note (Signed)
Addended by: Marin Olp on: 10/22/2018 10:29 AM   Modules accepted: Orders

## 2018-10-23 LAB — VITAMIN B12: Vitamin B-12: 238 pg/mL (ref 211–911)

## 2018-11-21 ENCOUNTER — Other Ambulatory Visit: Payer: Self-pay | Admitting: Family Medicine

## 2018-11-29 ENCOUNTER — Ambulatory Visit: Payer: Medicare Other | Admitting: Gastroenterology

## 2018-12-17 ENCOUNTER — Encounter: Payer: Self-pay | Admitting: Gastroenterology

## 2018-12-17 ENCOUNTER — Ambulatory Visit (INDEPENDENT_AMBULATORY_CARE_PROVIDER_SITE_OTHER): Payer: Medicare Other | Admitting: Gastroenterology

## 2018-12-17 ENCOUNTER — Other Ambulatory Visit: Payer: Self-pay

## 2018-12-17 VITALS — BP 130/80 | HR 76 | Temp 98.5°F | Ht 69.25 in | Wt 204.4 lb

## 2018-12-17 DIAGNOSIS — K5909 Other constipation: Secondary | ICD-10-CM

## 2018-12-17 DIAGNOSIS — Z86010 Personal history of colonic polyps: Secondary | ICD-10-CM | POA: Diagnosis not present

## 2018-12-17 DIAGNOSIS — R110 Nausea: Secondary | ICD-10-CM | POA: Diagnosis not present

## 2018-12-17 DIAGNOSIS — K219 Gastro-esophageal reflux disease without esophagitis: Secondary | ICD-10-CM | POA: Diagnosis not present

## 2018-12-17 MED ORDER — ONDANSETRON HCL 4 MG PO TABS: 4.0000 mg | ORAL_TABLET | Freq: Three times a day (TID) | ORAL | 3 refills | Status: DC | PRN

## 2018-12-17 MED ORDER — OMEPRAZOLE 40 MG PO CPDR: 40.0000 mg | DELAYED_RELEASE_CAPSULE | Freq: Every day | ORAL | 3 refills | Status: DC

## 2018-12-17 MED ORDER — PROMETHAZINE HCL 12.5 MG PO TABS: 12.5000 mg | ORAL_TABLET | Freq: Every day | ORAL | 3 refills | Status: DC

## 2018-12-17 NOTE — Progress Notes (Signed)
Mark [name]    [id number]    Jul 05, 1950  Primary Care Physician:[name], [name], MD  Referring Physician: Marin Olp, MD [place],  [place] [id number]   Chief complaint:  Nausea  HPI: 68 year old male with history of chronic GERD and nausea here for follow-up visit. Overall his symptoms are stable but he continues to have breakthrough heartburn on average once or twice a week especially worse with greasy or heavy meals.  On most days he is taking omeprazole once daily but for few days a week he has to take it twice a day.  He also needs to take Zofran or Phenergan intermittently for nausea. His weight is stable.  Bowel habits regular with MiraLAX daily. Denies any dysphagia, odynophagia, vomiting, abdominal pain, melena or bright red blood per rectum   4 hours gastric emptying scan 2018: Normal HIDA scan: Normal 65% gallbladder ejection fraction EGD February 2011: Mild gastritis and esophagitis otherwise normal exam Colonoscopy 2014: Removal of tubular adenoma Colonoscopy August 2019 with removal of 6 sessile polyps [tubular adenoma and sessile serrated adenomas], recall colonoscopy in 3 years.  Outpatient Encounter Medications as of 12/17/2018  Medication Sig  . albuterol (PROAIR HFA) 108 (90 Base) MCG/ACT inhaler INHALE 2 PUFFS EVERY 6 HOURS AS NEEDED FOR WHEEZING OR SHORTNESS OF BREATH  . amLODipine (NORVASC) 2.5 MG tablet TAKE 1 TABLET BY MOUTH EVERY DAY  . atorvastatin (LIPITOR) 20 MG tablet Take 1 tablet (20 mg total) by mouth daily. (Patient taking differently: Take 20 mg by mouth daily. Pt is taking at bedtime)  . azelastine (ASTELIN) 0.1 % nasal spray PLACE 2 SPRAYS INTO BOTH NOSTRILS DAILY  . butalbital-acetaminophen-caffeine (FIORICET) 50-325-40 MG tablet TAKE 1 TABLET BY MOUTH DAILY AS NEEDED  . cyclobenzaprine (FLEXERIL) 10 MG tablet TAKE 0.5-1 TABLETS BY MOUTH 3 (THREE) TIMES DAILY AS NEEDED FOR MUSCLE SPASMS.  .  fexofenadine (ALLEGRA) 180 MG tablet Patient is taking 2 times daily  . fluticasone (FLONASE) 50 MCG/ACT nasal spray USE ONE SPRAY AS DIRECTED ONCE DAILY  . gabapentin (NEURONTIN) 300 MG capsule Take 900 mg by mouth at bedtime.  . montelukast (SINGULAIR) 10 MG tablet TAKE 1 TABLET BY MOUTH EVERYDAY AT BEDTIME  . omeprazole (PRILOSEC) 40 MG capsule Take 1 capsule (40 mg total) by mouth daily.  . ondansetron (ZOFRAN) 4 MG tablet Take 1 tablet (4 mg total) by mouth every 8 (eight) hours as needed for nausea or vomiting.  [name] Kitchen PARoxetine (PAXIL) 20 MG tablet TAKE 1 TABLET BY MOUTH EVERYDAY AT BEDTIME  . Polyethylene Glycol 3350 (MIRALAX PO) Take by mouth. Miralax 238 gm bowel prep-Take as directed  . promethazine (PHENERGAN) 12.5 MG tablet Take 1 tablet (12.5 mg total) by mouth daily. As needed  . propranolol-hydrochlorothiazide (INDERIDE) 40-25 MG tablet Take 1 tablet by mouth 2 (two) times daily.  . simvastatin (ZOCOR) 20 MG tablet Take 1 tablet by mouth daily.  . traMADol (ULTRAM) 50 MG tablet Take 1 tablet (50 mg total) by mouth daily as needed.  . VESICARE 5 MG tablet Take 1 tablet by mouth daily.  . [DISCONTINUED] omeprazole (PRILOSEC) 40 MG capsule Take 1 capsule (40 mg total) by mouth daily.  . [DISCONTINUED] promethazine (PHENERGAN) 12.5 MG tablet Take 1 tablet (12.5 mg total) by mouth every 6 (six) hours as needed for nausea or vomiting.   No facility-administered encounter medications on file as of 12/17/2018.     Allergies as of  12/17/2018 - Review Complete 10/22/2018  Allergen Reaction Noted  . Propoxyphene hcl Nausea And Vomiting 03/27/2006    Past Medical History:  Diagnosis Date  . Allergy    seasonal  . Anxiety state, unspecified 09/01/2009  . ASTHMA 03/12/2007  . Asthma   . Cancer [facility]) 2008   prostate cancer  . Gastroparesis 05/01/2009  . GERD 04/02/2009  . Headache(784.0) 03/07/2007  . HIATAL HERNIA 04/28/2009  . Hiatal hernia   . HYPERLIPIDEMIA 06/11/2007  . HYPERTENSION  03/07/2007  . Pneumonia    hx of  . Polyp of colon, hyperplastic   . PROSTATE CANCER, HX OF 03/07/2007  . Suspected exposure to asbestos    have asbestosis testing every year  . TRANSAMINASES, SERUM, ELEVATED 04/28/2009  . Tubular adenoma of colon   . Ulcer    as teenager    Past Surgical History:  Procedure Laterality Date  . APPENDECTOMY    . BACK SURGERY  05/2011  . COLONOSCOPY    . LUMBAR LAMINECTOMY/DECOMPRESSION MICRODISCECTOMY N/A 10/09/2013   Procedure: L4-5 DECOMPRESSION/FARAMINOTOMY REVISION ;  Surgeon: Melina Schools, MD;  Location: [facility];  Service: Orthopedics;  Laterality: N/A;  . PROSTATECTOMY  07/10/06  . ROTATOR CUFF REPAIR     9/09 on right  . TRANSFORAMINAL LUMBAR INTERBODY FUSION (TLIF) WITH PEDICLE SCREW FIXATION 1 LEVEL N/A 10/09/2013   Procedure: TLIF L5-S1;  Surgeon: Melina Schools, MD;  Location: [facility];  Service: Orthopedics;  Laterality: N/A;    Family History  Problem Relation Age of Onset  . Pancreatic cancer Mother 65  . Hypertension Father   . Heart attack Father        48  . Hypertension Brother        x 2  . Hypertension Brother   . Kidney disease Brother   . Hypertension Brother   . Multiple sclerosis Sister   . Heart disease Sister   . Heart attack Sister   . Hypertension Son   . Heart attack Maternal Grandmother   . Heart attack Paternal Grandmother   . Heart attack Paternal Grandfather   . Colon cancer Neg Hx   . Rectal cancer Neg Hx   . Stomach cancer Neg Hx     Social History   Socioeconomic History  . Marital status: Married    Spouse name: Not on file  . Number of children: 4  . Years of education: Not on file  . Highest education level: Not on file  Occupational History  . Occupation: Disabled  Social Needs  . Financial resource strain: Not on file  . Food insecurity    Worry: Not on file    Inability: Not on file  . Transportation needs    Medical: Not on file    Non-medical: Not on file  Tobacco Use  . Smoking  status: Former Smoker    Packs/day: 0.50    Years: 2.00    Pack years: 1.00    Types: Cigarettes    Quit date: 04/09/1983    Years since quitting: 35.7  . Smokeless tobacco: Never Used  Substance and Sexual Activity  . Alcohol use: No    Alcohol/week: 0.0 standard drinks  . Drug use: No  . Sexual activity: Yes  Lifestyle  . Physical activity    Days per week: Not on file    Minutes per session: Not on file  . Stress: Not on file  Relationships  . Social connections    Talks on phone: Not on file  Gets together: Not on file    Attends religious service: Not on file    Active member of club or organization: Not on file    Attends meetings of clubs or organizations: Not on file    Relationship status: Not on file  . Intimate partner violence    Fear of current or ex partner: Not on file    Emotionally abused: Not on file    Physically abused: Not on file    Forced sexual activity: Not on file  Other Topics Concern  . Not on file  Social History Narrative   Married 1974. Wife-Helen. 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       Retired from [employer]: hunting, boat (off for 1 year in 2015)      Review of systems: Review of Systems  Constitutional: Negative for fever and chills.  HENT: Positive sinus problem  Eyes: Negative for blurred vision.  Respiratory: Negative for cough, shortness of breath and wheezing.   Cardiovascular: Negative for chest pain and palpitations.  Gastrointestinal: as per HPI Genitourinary: Negative for dysuria, urgency, frequency and hematuria.  Musculoskeletal: Negative for myalgias, positive for back pain and joint pain.  Skin: Negative for itching and rash.  Neurological: Negative for dizziness, tremors, focal weakness, seizures and loss of consciousness. Positive for frequent  headaches. Endo/Heme/Allergies: Positive for seasonal allergies.  Psychiatric/Behavioral: Negative for depression, suicidal ideas and hallucinations.  All other systems reviewed and are negative.   Physical Exam: Vitals:   12/17/18 1337  BP: 130/80  Pulse: 76  Temp: 98.5 F (36.9 C)   Body mass index is 29.96 kg/m. Gen:      No acute distress HEENT:  EOMI, sclera anicteric Neck:     No masses; no thyromegaly Lungs:    Clear to auscultation bilaterally; normal respiratory effort CV:         Regular rate and rhythm; no murmurs Abd:      + bowel sounds; soft, non-tender; no palpable masses, no distension Ext:    No edema; adequate peripheral perfusion Skin:      Warm and dry; no rash Neuro: alert and oriented x 3 Psych: normal mood and affect  Data Reviewed:  Reviewed labs, radiology imaging, old records and pertinent past GI work up   Assessment and Plan/Recommendations:  68 year old male with history of hypertension, hyperlipidemia, anxiety disorder, asbestosis, chronic GERD, hiatal hernia and history of multiple advanced adenomatous polyps  GERD: Continue omeprazole 40 mg daily with additional p.m. dose as needed Discussed antireflux measures and lifestyle modifications in detail  Lactose intolerance: Avoid milk and dairy product Okay to use Lactaid pills as needed  Nausea: Multifactorial, GERD is also likely playing a significant role Okay to use Phenergan or Zofran as needed  Constipation: Increase dietary fiber and fluid intake MiraLAX 1 capful daily as needed  History of advanced adenomatous colon polyps: Due for surveillance colonoscopy August 2022  Return in 1 year or sooner if needed  25 minutes was spent face-to-face with the patient. Greater than 50% of the time used for counseling as well as treatment plan and follow-up. He had multiple questions which were answered to his satisfaction  K. Denzil Magnuson , MD    CC: Marin Olp, MD  Takes  Zofran or Phenergan as  needed

## 2018-12-17 NOTE — Patient Instructions (Addendum)
Your recall colonoscopy is 09/28/2020, we will mail you out a reminder letter  We will refill your omeprazole today  We will send phenergan and Zofran to your pharmacy  Take Miralax 1 capful daily as needed   Gastroesophageal Reflux Disease, Adult Gastroesophageal reflux (GER) happens when acid from the stomach flows up into the tube that connects the mouth and the stomach (esophagus). Normally, food travels down the esophagus and stays in the stomach to be digested. However, when a person has GER, food and stomach acid sometimes move back up into the esophagus. If this becomes a more serious problem, the person may be diagnosed with a disease called gastroesophageal reflux disease (GERD). GERD occurs when the reflux:  Happens often.  Causes frequent or severe symptoms.  Causes problems such as damage to the esophagus. When stomach acid comes in contact with the esophagus, the acid may cause soreness (inflammation) in the esophagus. Over time, GERD may create small holes (ulcers) in the lining of the esophagus. What are the causes? This condition is caused by a problem with the muscle between the esophagus and the stomach (lower esophageal sphincter, or LES). Normally, the LES muscle closes after food passes through the esophagus to the stomach. When the LES is weakened or abnormal, it does not close properly, and that allows food and stomach acid to go back up into the esophagus. The LES can be weakened by certain dietary substances, medicines, and medical conditions, including:  Tobacco use.  Pregnancy.  Having a hiatal hernia.  Alcohol use.  Certain foods and beverages, such as coffee, chocolate, onions, and peppermint. What increases the risk? You are more likely to develop this condition if you:  Have an increased body weight.  Have a connective tissue disorder.  Use NSAID medicines. What are the signs or symptoms? Symptoms of this condition include:  Heartburn.   Difficult or painful swallowing.  The feeling of having a lump in the throat.  Abitter taste in the mouth.  Bad breath.  Having a large amount of saliva.  Having an upset or bloated stomach.  Belching.  Chest pain. Different conditions can cause chest pain. Make sure you see your health care provider if you experience chest pain.  Shortness of breath or wheezing.  Ongoing (chronic) cough or a night-time cough.  Wearing away of tooth enamel.  Weight loss. How is this diagnosed? Your health care provider will take a medical history and perform a physical exam. To determine if you have mild or severe GERD, your health care provider may also monitor how you respond to treatment. You may also have tests, including:  A test to examine your stomach and esophagus with a small camera (endoscopy).  A test thatmeasures the acidity level in your esophagus.  A test thatmeasures how much pressure is on your esophagus.  A barium swallow or modified barium swallow test to show the shape, size, and functioning of your esophagus. How is this treated? The goal of treatment is to help relieve your symptoms and to prevent complications. Treatment for this condition may vary depending on how severe your symptoms are. Your health care provider may recommend:  Changes to your diet.  Medicine.  Surgery. Follow these instructions at home: Eating and drinking   Follow a diet as recommended by your health care provider. This may involve avoiding foods and drinks such as: ? Coffee and tea (with or without caffeine). ? Drinks that containalcohol. ? Energy drinks and sports drinks. ? Carbonated drinks  or sodas. ? Chocolate and cocoa. ? Peppermint and mint flavorings. ? Garlic and onions. ? Horseradish. ? Spicy and acidic foods, including peppers, chili powder, curry powder, vinegar, hot sauces, and barbecue sauce. ? Citrus fruit juices and citrus fruits, such as oranges, lemons, and  limes. ? Tomato-based foods, such as red sauce, chili, salsa, and pizza with red sauce. ? Fried and fatty foods, such as donuts, french fries, potato chips, and high-fat dressings. ? High-fat meats, such as hot dogs and fatty cuts of red and white meats, such as rib eye steak, sausage, ham, and bacon. ? High-fat dairy items, such as whole milk, butter, and cream cheese.  Eat small, frequent meals instead of large meals.  Avoid drinking large amounts of liquid with your meals.  Avoid eating meals during the 2-3 hours before bedtime.  Avoid lying down right after you eat.  Do not exercise right after you eat. Lifestyle   Do not use any products that contain nicotine or tobacco, such as cigarettes, e-cigarettes, and chewing tobacco. If you need help quitting, ask your health care provider.  Try to reduce your stress by using methods such as yoga or meditation. If you need help reducing stress, ask your health care provider.  If you are overweight, reduce your weight to an amount that is healthy for you. Ask your health care provider for guidance about a safe weight loss goal. General instructions  Pay attention to any changes in your symptoms.  Take over-the-counter and prescription medicines only as told by your health care provider. Do not take aspirin, ibuprofen, or other NSAIDs unless your health care provider told you to do so.  Wear loose-fitting clothing. Do not wear anything tight around your waist that causes pressure on your abdomen.  Raise (elevate) the head of your bed about 6 inches (15 cm).  Avoid bending over if this makes your symptoms worse.  Keep all follow-up visits as told by your health care provider. This is important. Contact a health care provider if:  You have: ? New symptoms. ? Unexplained weight loss. ? Difficulty swallowing or it hurts to swallow. ? Wheezing or a persistent cough. ? A hoarse voice.  Your symptoms do not improve with treatment.  Get help right away if you:  Have pain in your arms, neck, jaw, teeth, or back.  Feel sweaty, dizzy, or light-headed.  Have chest pain or shortness of breath.  Vomit and your vomit looks like blood or coffee grounds.  Faint.  Have stool that is bloody or black.  Cannot swallow, drink, or eat. Summary  Gastroesophageal reflux happens when acid from the stomach flows up into the esophagus. GERD is a disease in which the reflux happens often, causes frequent or severe symptoms, or causes problems such as damage to the esophagus.  Treatment for this condition may vary depending on how severe your symptoms are. Your health care provider may recommend diet and lifestyle changes, medicine, or surgery.  Contact a health care provider if you have new or worsening symptoms.  Take over-the-counter and prescription medicines only as told by your health care provider. Do not take aspirin, ibuprofen, or other NSAIDs unless your health care provider told you to do so.  Keep all follow-up visits as told by your health care provider. This is important. This information is not intended to replace advice given to you by your health care provider. Make sure you discuss any questions you have with your health care provider. Document Released: 11/24/2004  Document Revised: 08/23/2017 Document Reviewed: 08/23/2017 Elsevier Patient Education  [place].   I appreciate the  opportunity to care for you  Thank You   Harl Bowie , MD

## 2018-12-20 ENCOUNTER — Other Ambulatory Visit: Payer: Self-pay | Admitting: Family Medicine

## 2018-12-22 ENCOUNTER — Other Ambulatory Visit: Payer: Self-pay | Admitting: Family Medicine

## 2019-01-01 DIAGNOSIS — H04123 Dry eye syndrome of bilateral lacrimal glands: Secondary | ICD-10-CM | POA: Diagnosis not present

## 2019-02-10 ENCOUNTER — Emergency Department (HOSPITAL_COMMUNITY)
Admission: EM | Admit: 2019-02-10 | Discharge: 2019-02-10 | Disposition: A | Discharge status: Home / Self Care | Payer: Medicare Other | Attending: Emergency Medicine | Admitting: Emergency Medicine

## 2019-02-10 ENCOUNTER — Other Ambulatory Visit: Payer: Self-pay

## 2019-02-10 ENCOUNTER — Encounter (HOSPITAL_COMMUNITY): Payer: Self-pay | Admitting: Emergency Medicine

## 2019-02-10 DIAGNOSIS — Z87891 Personal history of nicotine dependence: Secondary | ICD-10-CM | POA: Diagnosis not present

## 2019-02-10 DIAGNOSIS — Z8709 Personal history of other diseases of the respiratory system: Secondary | ICD-10-CM | POA: Insufficient documentation

## 2019-02-10 DIAGNOSIS — I10 Essential (primary) hypertension: Secondary | ICD-10-CM | POA: Insufficient documentation

## 2019-02-10 DIAGNOSIS — H1131 Conjunctival hemorrhage, right eye: Secondary | ICD-10-CM | POA: Insufficient documentation

## 2019-02-10 DIAGNOSIS — Z79899 Other long term (current) drug therapy: Secondary | ICD-10-CM | POA: Insufficient documentation

## 2019-02-10 DIAGNOSIS — Z8546 Personal history of malignant neoplasm of prostate: Secondary | ICD-10-CM | POA: Insufficient documentation

## 2019-02-10 DIAGNOSIS — H538 Other visual disturbances: Secondary | ICD-10-CM | POA: Insufficient documentation

## 2019-02-10 DIAGNOSIS — H53149 Visual discomfort, unspecified: Secondary | ICD-10-CM | POA: Diagnosis not present

## 2019-02-10 DIAGNOSIS — H5711 Ocular pain, right eye: Secondary | ICD-10-CM | POA: Diagnosis present

## 2019-02-10 MED ORDER — FLUORESCEIN SODIUM 1 MG OP STRP
1.0000 | ORAL_STRIP | Freq: Once | OPHTHALMIC | Status: AC
  Administered 2019-02-10: 1 via OPHTHALMIC
  Filled 2019-02-10: qty 1

## 2019-02-10 MED ORDER — TETRACAINE HCL 0.5 % OP SOLN
2.0000 [drp] | Freq: Once | OPHTHALMIC | Status: AC
  Administered 2019-02-10: 2 [drp] via OPHTHALMIC
  Filled 2019-02-10: qty 4

## 2019-02-10 NOTE — ED Notes (Signed)
Patient verbalizes understanding of discharge instructions. Opportunity for questioning and answers were provided. Armband removed by staff, pt discharged from ED ambulatory.   

## 2019-02-10 NOTE — Discharge Instructions (Addendum)
Please apply ice pack to eye for comfort. You may also use chilled Artificial Tears as indicated to relieve soreness of your eye.   See attached information on subconjunctival hemorrhage.   You may follow up with Dr. Prudencio Burly with [facility] any day this week besides Tuesday if you are concern. Call his office to schedule an appointment as needed.

## 2019-02-10 NOTE — Progress Notes (Signed)
Patient ID: Mark [name], male   DOB: 1951-01-11, 68 y.o.   MRN: [id number]  Spoke to Mark [name] re: [name] a 68 yo M with a red right eye.  We discusses his presenting history, symptoms, and her examination.  I've reviewed her note and the color pictures taken.  OD appears intact with a clear cornea with bright light reflex and formed AC.  There is a subconjunctival hemorrhage.    Recommend:   -- Cool compresses and artificial tears as needed OD. -- Recheck and document normal pH (no history of basic substance exposure).  -- Follow up with me in clinic if patient has any ongoing concerns or issues.   Katy Apo, MD.  [facility] [phone]

## 2019-02-10 NOTE — ED Notes (Signed)
ED Provider at bedside. 

## 2019-02-10 NOTE — ED Notes (Signed)
Pt irrigated eye at eyewash station

## 2019-02-10 NOTE — ED Triage Notes (Signed)
Pt woke up this morning with R eye redness, pain, and blurred vision.  He thinks he may have gotten battery acid in R eye yesterday.

## 2019-02-10 NOTE — ED Provider Notes (Signed)
[facility] EMERGENCY DEPARTMENT Provider Note   CSN: [id number] Arrival date & time: 02/10/19  [id number]     History Chief Complaint  Patient presents with  . eye pain    Mark [name] is a 68 y.o. male with PMHx HTN, HLD, asthma, GERD who presents to the ED today complaining of gradual onset, constant, "sore", right eye pain that began this morning around 2 AM.  Reports the pain woke him up out of his sleep.  He went to the bathroom to take a look at his eye and noticed diffuse redness throughout the conjunctiva.  Patient states he had some blurry vision but after he washed his eye out with some water he states this has resolved.  He does typically wear prescription lenses, does not wear contacts.  No trauma noted to the eye.  Patient does report that he was working underneath his car yesterday something dropped on his forehead which she quickly wiped away.  Patient states he did not think anything fell into his eye.  He states it might have been battery acid but could have been a number of other drips from anywhere under his hood.  he denies drainage to the eye.  No other symptoms at this time.       Past Medical History:  Diagnosis Date  . Allergy    seasonal  . Anxiety state, unspecified 09/01/2009  . ASTHMA 03/12/2007  . Asthma   . Cancer [facility]) 2008   prostate cancer  . Gastroparesis 05/01/2009  . GERD 04/02/2009  . Headache(784.0) 03/07/2007  . HIATAL HERNIA 04/28/2009  . Hiatal hernia   . HYPERLIPIDEMIA 06/11/2007  . HYPERTENSION 03/07/2007  . Pneumonia    hx of  . Polyp of colon, hyperplastic   . PROSTATE CANCER, HX OF 03/07/2007  . Suspected exposure to asbestos    have asbestosis testing every year  . TRANSAMINASES, SERUM, ELEVATED 04/28/2009  . Tubular adenoma of colon   . Ulcer    as teenager    Patient Active Problem List   Diagnosis Date Noted  . History of adenomatous polyp of colon 09/08/2017  . Asbestosis ([place]) 02/05/2016  . Allergic rhinitis  11/22/2013  . Back pain 10/09/2013  . Overweight 01/10/2012  . Anxiety state 09/01/2009  . Gastroparesis 05/01/2009  . HIATAL HERNIA 04/28/2009  . GERD 04/02/2009  . Hyperlipidemia 06/11/2007  . Asthma 03/12/2007  . Essential hypertension 03/07/2007  . Migraine headache 03/07/2007  . PROSTATE CANCER, HX OF 03/07/2007    Past Surgical History:  Procedure Laterality Date  . APPENDECTOMY    . BACK SURGERY  05/2011  . COLONOSCOPY    . LUMBAR LAMINECTOMY/DECOMPRESSION MICRODISCECTOMY N/A 10/09/2013   Procedure: L4-5 DECOMPRESSION/FARAMINOTOMY REVISION ;  Surgeon: Melina Schools, MD;  Location: [place];  Service: Orthopedics;  Laterality: N/A;  . PROSTATECTOMY  07/10/06  . ROTATOR CUFF REPAIR     9/09 on right  . TRANSFORAMINAL LUMBAR INTERBODY FUSION (TLIF) WITH PEDICLE SCREW FIXATION 1 LEVEL N/A 10/09/2013   Procedure: TLIF L5-S1;  Surgeon: Melina Schools, MD;  Location: [facility];  Service: Orthopedics;  Laterality: N/A;       Family History  Problem Relation Age of Onset  . Pancreatic cancer Mother 73  . Hypertension Father   . Heart attack Father        17  . Hypertension Brother        x 2  . Hypertension Brother   . Kidney disease Brother   .  Hypertension Brother   . Multiple sclerosis Sister   . Heart disease Sister   . Heart attack Sister   . Hypertension Son   . Heart attack Maternal Grandmother   . Heart attack Paternal Grandmother   . Heart attack Paternal Grandfather   . Colon cancer Neg Hx   . Rectal cancer Neg Hx   . Stomach cancer Neg Hx     Social History   Tobacco Use  . Smoking status: Former Smoker    Packs/day: 0.50    Years: 2.00    Pack years: 1.00    Types: Cigarettes    Quit date: 04/09/1983    Years since quitting: 35.8  . Smokeless tobacco: Never Used  Substance Use Topics  . Alcohol use: No    Alcohol/week: 0.0 standard drinks  . Drug use: No    Home Medications Prior to Admission medications   Medication Sig Start Date End Date Taking?  Authorizing Provider  albuterol (PROAIR HFA) 108 (90 Base) MCG/ACT inhaler INHALE 2 PUFFS EVERY 6 HOURS AS NEEDED FOR WHEEZING OR SHORTNESS OF BREATH 10/22/18   Marin Olp, MD  amLODipine (NORVASC) 2.5 MG tablet TAKE 1 TABLET BY MOUTH EVERY DAY 12/24/18   Marin Olp, MD  atorvastatin (LIPITOR) 20 MG tablet Take 1 tablet (20 mg total) by mouth daily. Pt is taking at bedtime 12/24/18   Marin Olp, MD  azelastine (ASTELIN) 0.1 % nasal spray PLACE 2 SPRAYS INTO BOTH NOSTRILS DAILY 09/05/18   Marin Olp, MD  butalbital-acetaminophen-caffeine (FIORICET) [phone] MG tablet TAKE 1 TABLET BY MOUTH DAILY AS NEEDED 10/22/18   Marin Olp, MD  cyclobenzaprine (FLEXERIL) 10 MG tablet TAKE 0.5-1 TABLETS BY MOUTH 3 (THREE) TIMES DAILY AS NEEDED FOR MUSCLE SPASMS. 08/28/18   Marin Olp, MD  fexofenadine [facility]) 180 MG tablet Patient is taking 2 times daily 11/21/18   Marin Olp, MD  fluticasone [facility]) 50 MCG/ACT nasal spray USE ONE SPRAY AS DIRECTED ONCE DAILY 06/22/18   Marin Olp, MD  gabapentin (NEURONTIN) 300 MG capsule Take 900 mg by mouth at bedtime. 06/10/16   [provider]  montelukast (SINGULAIR) 10 MG tablet TAKE 1 TABLET BY MOUTH EVERYDAY AT BEDTIME 03/15/18   Marin Olp, MD  omeprazole (PRILOSEC) 40 MG capsule TAKE 1 CAPSULE (40 MG TOTAL) BY MOUTH 2 (TWO) TIMES DAILY BEFORE A MEAL. 12/20/18   Marin Olp, MD  ondansetron (ZOFRAN) 4 MG tablet Take 1 tablet (4 mg total) by mouth every 8 (eight) hours as needed for nausea or vomiting. 12/17/18   Mauri Pole, MD  PARoxetine (PAXIL) 20 MG tablet TAKE 1 TABLET BY MOUTH EVERYDAY AT BEDTIME 09/08/17   Marin Olp, MD  Polyethylene Glycol 3350 (MIRALAX PO) Take by mouth. Miralax 238 gm bowel prep-Take as directed    [provider]  promethazine (PHENERGAN) 12.5 MG tablet Take 1 tablet (12.5 mg total) by mouth daily. As needed 12/17/18   Mauri Pole, MD    propranolol-hydrochlorothiazide (INDERIDE) 40-25 MG tablet Take 1 tablet by mouth 2 (two) times daily. 10/22/18   Marin Olp, MD  simvastatin (ZOCOR) 20 MG tablet Take 1 tablet by mouth daily.    [provider]  traMADol (ULTRAM) 50 MG tablet Take 1 tablet (50 mg total) by mouth daily as needed. 10/22/18   Marin Olp, MD  VESICARE 5 MG tablet Take 1 tablet by mouth daily. 06/01/16   [provider]  Allergies    Propoxyphene hcl  Review of Systems   Review of Systems  Constitutional: Negative for chills and fever.  HENT: Negative for ear pain.   Eyes: Positive for photophobia, pain, redness and visual disturbance (blurry; resolved). Negative for discharge.  All other systems reviewed and are negative.   Physical Exam Updated Vital Signs BP (!) 147/94 (BP Location: Left Arm)   Pulse 75   Temp 98.5 F (36.9 C) (Oral)   Resp 14   Ht 5\' 11"  (1.803 m)   Wt 88.5 kg   SpO2 100%   BMI 27.20 kg/m   Physical Exam Vitals and nursing note reviewed.  Constitutional:      Appearance: He is not ill-appearing.  HENT:     Head: Normocephalic and atraumatic.  Eyes:     Intraocular pressure: Right eye pressure is 24 mmHg.     Extraocular Movements: Extraocular movements intact.     Conjunctiva/sclera:     Right eye: Chemosis and hemorrhage present.     Slit lamp exam:    Right eye: No hyphema or anterior chamber flares.     Comments: See photos below. Conjunctival hemorrhage noted diffusely with mild chemosis to the medial aspect. ROM limited due to pain although EOMs. PERRL.   Visual Acuity Bilateral Near:20/30 R Near:10/25 L Near:10/32  PH 7.0 right eye. Unfortunately wood's lamp broken at this time; unable to assess for corneal abrasion or ulcer with wood's lamp.   Cardiovascular:     Rate and Rhythm: Normal rate and regular rhythm.     Pulses: Normal pulses.  Pulmonary:     Effort: Pulmonary effort is normal.     Breath sounds: Normal breath  sounds. No wheezing, rhonchi or rales.  Abdominal:     Palpations: Abdomen is soft.     Tenderness: There is no abdominal tenderness.  Musculoskeletal:     Cervical back: Neck supple.  Skin:    General: Skin is warm and dry.  Neurological:     Mental Status: He is alert.          ED Results / Procedures / Treatments   Labs (all labs ordered are listed, but only abnormal results are displayed) Labs Reviewed - No data to display  EKG None  Radiology No results found.  Procedures Procedures (including critical care time)  Medications Ordered in ED Medications  tetracaine (PONTOCAINE) 0.5 % ophthalmic solution 2 drop (has no administration in time range)  fluorescein ophthalmic strip 1 strip (has no administration in time range)    ED Course  I have reviewed the triage vital signs and the nursing notes.  Pertinent labs & imaging results that were available during my care of the patient were reviewed by me and considered in my medical decision making (see chart for details).  68 year old male presents to the ED today complaining of gradual onset of right thigh pain that started overnight.  Does endorse that he was working on his car underneath the hood yesterday and felt something dripping on his forehead although denies anything dripping in his eye and no immediate pain.   Reports he had some blurry vision when he looked at his eye initially but this has resolved.  Patient does wear corrective lenses.  No trauma to the eye. Pt is not anticoagulated. On Arrival patient is afebrile without tachycardia or tachypnea.   On exam patient does have diffuse subconjunctival hemorrhage throughout.  No hyphema.    Initial pH 8.0.  Patient's eyes were  irrigated and repeat pH of 7.0 immediately after testing the eye.  Doubt battery acid in eye as would expect ascitic and not alkalotic pH.  Visual acuity as above.  Fortunately able to properly assess for corneal abrasion or ulcer as Sherral Hammers  lamp is broken and it has not been replaced.  No blue penlight available either to assess.  Slit-lamp not working adequately and unable to use blue light on this as well.  Will consult ophthalmology for further recommendations at this time.   Discussed case with Dr. Prudencio Burly with [facility] ophthalmology who was able to look at the photos in his chart and reports it appears the patient has subconjunctival hemorrhage.  He recommends ice pack and chilled artificial tears for comfort.  He states that if patient is concerned he may follow-up with him any day this week besides Tuesday.  Updated patient on plan who is in agreement at this time.  Will discharge.  All questions have been answered in a timely manner.  This note was prepared using Dragon voice recognition software and may include unintentional dictation errors due to the inherent limitations of voice recognition software.   MDM Rules/Calculators/A&P   Final Clinical Impression(s) / ED Diagnoses Final diagnoses:  Subconjunctival hemorrhage of right eye    Rx / DC Orders ED Discharge Orders    None       Eustaquio Maize, PA-C 02/10/19 1053    Sherwood Gambler, MD 02/13/19 [phone]

## 2019-03-11 ENCOUNTER — Other Ambulatory Visit: Payer: Self-pay

## 2019-03-11 ENCOUNTER — Ambulatory Visit (INDEPENDENT_AMBULATORY_CARE_PROVIDER_SITE_OTHER): Payer: Medicare Other

## 2019-03-11 DIAGNOSIS — Z0000 Encounter for general adult medical examination without abnormal findings: Secondary | ICD-10-CM

## 2019-03-11 NOTE — Progress Notes (Addendum)
Subjective:   Mark [name] is a 69 y.o. male who presents for Medicare Annual/Subsequent preventive examination.  Review of Systems:   Cardiac Risk Factors include: advanced age (>50men, >74 women);male gender;hypertension;dyslipidemia    Objective:    Vitals: There were no vitals taken for this visit.  There is no height or weight on file to calculate BMI.  Advanced Directives 03/11/2019 09/19/2017 08/21/2017 10/10/2013 10/09/2013 10/03/2013 07/29/2011  Does Patient Have a Medical Advance Directive? Yes Yes No No Patient would not like information;Patient does not have advance directive Patient would not like information;Patient does not have advance directive -  Type of Advance Directive Living will;Healthcare Power of [place];Living will - - - - -  Does patient want to make changes to medical advance directive? No - Patient declined No - Patient declined - - - - -  Copy of [place] in Chart? No - copy requested No - copy requested - - - - -  Would patient like information on creating a medical advance directive? - - - No - patient declined information - - -  Pre-existing out of facility DNR order (yellow form or pink MOST form) - - - - - - No    Tobacco Social History   Tobacco Use  Smoking Status Former Smoker  . Packs/day: 0.50  . Years: 2.00  . Pack years: 1.00  . Types: Cigarettes  . Quit date: 04/09/1983  . Years since quitting: 35.9  Smokeless Tobacco Never Used     Counseling given: Not Answered   Clinical Intake:  Pre-visit preparation completed: Yes  Pain : No/denies pain  Diabetes: No  How often do you need to have someone help you when you read instructions, pamphlets, or other written materials from your doctor or pharmacy?: 1 - Never  Interpreter Needed?: No  Information entered by :: Denman George LPN  Past Medical History:  Diagnosis Date  . Allergy    seasonal  . Anxiety state, unspecified  09/01/2009  . ASTHMA 03/12/2007  . Asthma   . Cancer [facility]) 2008   prostate cancer  . Gastroparesis 05/01/2009  . GERD 04/02/2009  . Headache(784.0) 03/07/2007  . HIATAL HERNIA 04/28/2009  . Hiatal hernia   . HYPERLIPIDEMIA 06/11/2007  . HYPERTENSION 03/07/2007  . Pneumonia    hx of  . Polyp of colon, hyperplastic   . PROSTATE CANCER, HX OF 03/07/2007  . Suspected exposure to asbestos    have asbestosis testing every year  . TRANSAMINASES, SERUM, ELEVATED 04/28/2009  . Tubular adenoma of colon   . Ulcer    as teenager   Past Surgical History:  Procedure Laterality Date  . APPENDECTOMY    . BACK SURGERY  05/2011  . COLONOSCOPY    . LUMBAR LAMINECTOMY/DECOMPRESSION MICRODISCECTOMY N/A 10/09/2013   Procedure: L4-5 DECOMPRESSION/FARAMINOTOMY REVISION ;  Surgeon: Melina Schools, MD;  Location: [place];  Service: Orthopedics;  Laterality: N/A;  . PROSTATECTOMY  07/10/06  . ROTATOR CUFF REPAIR     9/09 on right  . TRANSFORAMINAL LUMBAR INTERBODY FUSION (TLIF) WITH PEDICLE SCREW FIXATION 1 LEVEL N/A 10/09/2013   Procedure: TLIF L5-S1;  Surgeon: Melina Schools, MD;  Location: [place];  Service: Orthopedics;  Laterality: N/A;   Family History  Problem Relation Age of Onset  . Pancreatic cancer Mother 49  . Hypertension Father   . Heart attack Father        31  . Hypertension Brother  x 2  . Hypertension Brother   . Kidney disease Brother   . Hypertension Brother   . Multiple sclerosis Sister   . Heart disease Sister   . Heart attack Sister   . Hypertension Son   . Heart attack Maternal Grandmother   . Heart attack Paternal Grandmother   . Heart attack Paternal Grandfather   . Colon cancer Neg Hx   . Rectal cancer Neg Hx   . Stomach cancer Neg Hx    Social History   Socioeconomic History  . Marital status: Married    Spouse name: Not on file  . Number of children: 4  . Years of education: Not on file  . Highest education level: Not on file  Occupational History  . Occupation:  Disabled  Tobacco Use  . Smoking status: Former Smoker    Packs/day: 0.50    Years: 2.00    Pack years: 1.00    Types: Cigarettes    Quit date: 04/09/1983    Years since quitting: 35.9  . Smokeless tobacco: Never Used  Substance and Sexual Activity  . Alcohol use: No    Alcohol/week: 0.0 standard drinks  . Drug use: No  . Sexual activity: Yes  Other Topics Concern  . Not on file  Social History Narrative   Married 1974. Wife-Helen. 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       Retired from [employer]: hunting, boat (off for 1 year in 2015)   Social Determinants of Health   Financial Resource Strain:   . Difficulty of Paying Living Expenses: Not on file  Food Insecurity:   . Worried About Charity fundraiser in the Last Year: Not on file  . Ran Out of Food in the Last Year: Not on file  Transportation Needs:   . Lack of Transportation (Medical): Not on file  . Lack of Transportation (Non-Medical): Not on file  Physical Activity:   . Days of Exercise per Week: Not on file  . Minutes of Exercise per Session: Not on file  Stress:   . Feeling of Stress : Not on file  Social Connections:   . Frequency of Communication with Friends and Family: Not on file  . Frequency of Social Gatherings with Friends and Family: Not on file  . Attends Religious Services: Not on file  . Active Member of Clubs or Organizations: Not on file  . Attends Archivist Meetings: Not on file  . Marital Status: Not on file    Outpatient Encounter Medications as of 03/11/2019  Medication Sig  . albuterol (PROAIR HFA) 108 (90 Base) MCG/ACT inhaler INHALE 2 PUFFS EVERY 6 HOURS AS NEEDED FOR WHEEZING OR SHORTNESS OF BREATH  . amLODipine (NORVASC) 2.5 MG tablet TAKE 1 TABLET BY MOUTH EVERY DAY  . atorvastatin (LIPITOR) 20 MG tablet Take  1 tablet (20 mg total) by mouth daily. Pt is taking at bedtime  . azelastine (ASTELIN) 0.1 % nasal spray PLACE 2 SPRAYS INTO BOTH NOSTRILS DAILY  . butalbital-acetaminophen-caffeine (FIORICET) 50-325-40 MG tablet TAKE 1 TABLET BY MOUTH DAILY AS NEEDED  . cyclobenzaprine (FLEXERIL) 10 MG tablet TAKE 0.5-1 TABLETS BY MOUTH 3 (THREE) TIMES DAILY AS NEEDED FOR MUSCLE SPASMS.  . fexofenadine (ALLEGRA) 180 MG tablet Patient is taking 2 times daily  .  fluticasone (FLONASE) 50 MCG/ACT nasal spray USE ONE SPRAY AS DIRECTED ONCE DAILY  . gabapentin (NEURONTIN) 300 MG capsule Take 900 mg by mouth at bedtime.  . montelukast (SINGULAIR) 10 MG tablet TAKE 1 TABLET BY MOUTH EVERYDAY AT BEDTIME  . omeprazole (PRILOSEC) 40 MG capsule TAKE 1 CAPSULE (40 MG TOTAL) BY MOUTH 2 (TWO) TIMES DAILY BEFORE A MEAL.  [name] Kitchen ondansetron (ZOFRAN) 4 MG tablet Take 1 tablet (4 mg total) by mouth every 8 (eight) hours as needed for nausea or vomiting.  [name] Kitchen PARoxetine (PAXIL) 20 MG tablet TAKE 1 TABLET BY MOUTH EVERYDAY AT BEDTIME  . Polyethylene Glycol 3350 (MIRALAX PO) Take by mouth. Miralax 238 gm bowel prep-Take as directed  . promethazine (PHENERGAN) 12.5 MG tablet Take 1 tablet (12.5 mg total) by mouth daily. As needed  . propranolol-hydrochlorothiazide (INDERIDE) 40-25 MG tablet Take 1 tablet by mouth 2 (two) times daily.  . simvastatin (ZOCOR) 20 MG tablet Take 1 tablet by mouth daily.  . traMADol (ULTRAM) 50 MG tablet Take 1 tablet (50 mg total) by mouth daily as needed.  . VESICARE 5 MG tablet Take 1 tablet by mouth daily.   No facility-administered encounter medications on file as of 03/11/2019.    Activities of Daily Living In your present state of health, do you have any difficulty performing the following activities: 03/11/2019  Hearing? N  Vision? Y  Difficulty concentrating or making decisions? N  Walking or climbing stairs? N  Dressing or bathing? N  Doing errands, shopping? N  Preparing Food and eating ? N    Using the Toilet? N  In the past six months, have you accidently leaked urine? N  Do you have problems with loss of bowel control? N  Managing your Medications? N  Managing your Finances? N  Housekeeping or managing your Housekeeping? N  Some recent data might be hidden    Patient Care Team: Marin Olp, MD as PCP - General (Family Medicine) Mauri Pole, MD as Consulting Physician (Gastroenterology) Raynelle Bring, MD as Consulting Physician (Urology)   Assessment:   This is a routine wellness examination for [place].  Exercise Activities and Dietary recommendations Current Exercise Habits: Home exercise routine, Type of exercise: treadmill;Other - see comments(stationary bike), Time (Minutes): 30, Frequency (Times/Week): 4, Weekly Exercise (Minutes/Week): 120, Intensity: Moderate  Goals    . Increase physical activity     Patient currently swimming 2 days a week. Wants to improve free style swimming    . Weight (lb) < 200 lb (90.7 kg)     Patient working to lose weight. Wants to lose 5-10 more pounds       Fall Risk Fall Risk  03/11/2019 09/19/2017 09/08/2016  Falls in the past year? 0 No No  Injury with Fall? 0 - -  Follow up Falls evaluation completed;Education provided;Falls prevention discussed - -   Is the patient's home free of loose throw rugs in walkways, pet beds, electrical cords, etc?   yes      Grab bars in the bathroom? yes      Handrails on the stairs?   yes      Adequate lighting?   yes  Timed Get Up and Go Performed: completed and within normal timeframe; no gait abnormalities noted   Depression Screen PHQ 2/9 Scores 03/11/2019 12/20/2017 09/19/2017 09/19/2017  PHQ - 2 Score 0 0 6 0  PHQ- 9 Score - 0 19 -    Cognitive Function- no cognitive concerns at this time  6CIT Screen 09/19/2017  What Year? 0 points  What month? 0 points  What time? 0 points  Count back from 20 0 points  Months in reverse 0 points    Immunization History   Administered Date(s) Administered  . Fluad Quad(high Dose 65+) 10/22/2018  . H1N1 03/21/2008  . Influenza Split 12/31/2010, 11/22/2011, 11/28/2012  . Influenza Whole 12/12/2007, 12/11/2008  . Influenza, High Dose Seasonal PF 12/07/2016, 12/20/2017  . Influenza,inj,Quad PF,6+ Mos 12/12/2014, 12/31/2015  . Pneumococcal Conjugate-13 12/07/2016  . Pneumococcal Polysaccharide-23 12/20/2017  . Td 03/01/1995, 06/17/2008  . Tdap 06/11/2007, 08/27/2018  . Zoster 01/10/2012    Qualifies for Shingles Vaccine? Discussed and patient will check with pharmacy for coverage.  Patient education handout provided   Screening Tests Health Maintenance  Topic Date Due  . COLONOSCOPY  10/03/2020  . TETANUS/TDAP  08/26/2028  . INFLUENZA VACCINE  Completed  . Hepatitis C Screening  Completed  . PNA vac Low Risk Adult  Completed   Cancer Screenings: Lung: Low Dose CT Chest recommended if Age 50-80 years, 30 pack-year currently smoking OR have quit w/in 15years. Patient does not qualify. Colorectal: colonoscopy 10/03/17 with Dr. Silverio Decamp      Plan:  I have personally reviewed and addressed the Medicare Annual Wellness questionnaire and have noted the following in the patient's chart:  A. Medical and social history B. Use of alcohol, tobacco or illicit drugs  C. Current medications and supplements D. Functional ability and status E.  Nutritional status F.  Physical activity G. Advance directives H. List of other physicians I.  Hospitalizations, surgeries, and ER visits in previous 12 months J.  [place] such as hearing and vision if needed, cognitive and depression L. Referrals, records requested, and appointments- none   In addition, I have reviewed and discussed with patient certain preventive protocols, quality metrics, and best practice recommendations. A written personalized care plan for preventive services as well as general preventive health recommendations were provided to  patient.   Signed,  Denman George, LPN  Nurse Health Advisor   Nurse Notes: no additional   I have reviewed and agree with note, evaluation, plan.   Garret Reddish, MD

## 2019-03-11 NOTE — Patient Instructions (Addendum)
[name] , Thank you for taking time to come for your Medicare Wellness Visit. I appreciate your ongoing commitment to your health goals. Please review the following plan we discussed and let me know if I can assist you in the future.   Screening recommendations/referrals: Colorectal Screening: up to date; last colonoscopy 10/03/17  Vision and Dental Exams: Recommended annual ophthalmology exams for early detection of glaucoma and other disorders of the eye Recommended annual dental exams for proper oral hygiene  Vaccinations: Influenza vaccine: completed 10/22/18 Pneumococcal vaccine: up to date; last 12/20/17 Tdap vaccine: up to date; last 08/27/18 Shingles vaccine: Please call your insurance company to determine your out of pocket expense for the Shingrix vaccine. You may receive this vaccine at your local pharmacy.  Advanced directives: Please bring a copy of your POA (Power of Attorney) and/or Living Will to your next appointment.  Goals: Recommend to drink at least 6-8 8oz glasses of water per day and consume a balanced diet rich in fresh fruits and vegetables.   Next appointment: Please schedule your Annual Wellness Visit with your Nurse Health Advisor in one year.  Preventive Care 16 Years and Older, Male Preventive care refers to lifestyle choices and visits with your health care provider that can promote health and wellness. What does preventive care include?  A yearly physical exam. This is also called an annual well check.  Dental exams once or twice a year.  Routine eye exams. Ask your health care provider how often you should have your eyes checked.  Personal lifestyle choices, including:  Daily care of your teeth and gums.  Regular physical activity.  Eating a healthy diet.  Avoiding tobacco and drug use.  Limiting alcohol use.  Practicing safe sex.  Taking low doses of aspirin every day if recommended by your health care provider..  Taking vitamin and  mineral supplements as recommended by your health care provider. What happens during an annual well check? The services and screenings done by your health care provider during your annual well check will depend on your age, overall health, lifestyle risk factors, and family history of disease. Counseling  Your health care provider may ask you questions about your:  Alcohol use.  Tobacco use.  Drug use.  Emotional well-being.  Home and relationship well-being.  Sexual activity.  Eating habits.  History of falls.  Memory and ability to understand (cognition).  Work and work Statistician. Screening  You may have the following tests or measurements:  Height, weight, and BMI.  Blood pressure.  Lipid and cholesterol levels. These may be checked every 5 years, or more frequently if you are over 43 years old.  Skin check.  Lung cancer screening. You may have this screening every year starting at age 70 if you have a 30-pack-year history of smoking and currently smoke or have quit within the past 15 years.  Fecal occult blood test (FOBT) of the stool. You may have this test every year starting at age 49.  Flexible sigmoidoscopy or colonoscopy. You may have a sigmoidoscopy every 5 years or a colonoscopy every 10 years starting at age 33.  Prostate cancer screening. Recommendations will vary depending on your family history and other risks.  Hepatitis C blood test.  Hepatitis B blood test.  Sexually transmitted disease (STD) testing.  Diabetes screening. This is done by checking your blood sugar (glucose) after you have not eaten for a while (fasting). You may have this done every 1-3 years.  Abdominal aortic aneurysm (  AAA) screening. You may need this if you are a current or former smoker.  Osteoporosis. You may be screened starting at age 55 if you are at high risk. Talk with your health care provider about your test results, treatment options, and if necessary, the need  for more tests. Vaccines  Your health care provider may recommend certain vaccines, such as:  Influenza vaccine. This is recommended every year.  Tetanus, diphtheria, and acellular pertussis (Tdap, Td) vaccine. You may need a Td booster every 10 years.  Zoster vaccine. You may need this after age 92.  Pneumococcal 13-valent conjugate (PCV13) vaccine. One dose is recommended after age 31.  Pneumococcal polysaccharide (PPSV23) vaccine. One dose is recommended after age 68. Talk to your health care provider about which screenings and vaccines you need and how often you need them. This information is not intended to replace advice given to you by your health care provider. Make sure you discuss any questions you have with your health care provider. Document Released: 03/13/2015 Document Revised: 11/04/2015 Document Reviewed: 12/16/2014 Elsevier Interactive Patient Education  2017 [place] Prevention in the Home Falls can cause injuries. They can happen to people of all ages. There are many things you can do to make your home safe and to help prevent falls. What can I do on the outside of my home?  Regularly fix the edges of walkways and driveways and fix any cracks.  Remove anything that might make you trip as you walk through a door, such as a raised step or threshold.  Trim any bushes or trees on the path to your home.  Use bright outdoor lighting.  Clear any walking paths of anything that might make someone trip, such as rocks or tools.  Regularly check to see if handrails are loose or broken. Make sure that both sides of any steps have handrails.  Any raised decks and porches should have guardrails on the edges.  Have any leaves, snow, or ice cleared regularly.  Use sand or salt on walking paths during winter.  Clean up any spills in your garage right away. This includes oil or grease spills. What can I do in the bathroom?  Use night lights.  Install grab bars  by the toilet and in the tub and shower. Do not use towel bars as grab bars.  Use non-skid mats or decals in the tub or shower.  If you need to sit down in the shower, use a plastic, non-slip stool.  Keep the floor dry. Clean up any water that spills on the floor as soon as it happens.  Remove soap buildup in the tub or shower regularly.  Attach bath mats securely with double-sided non-slip rug tape.  Do not have throw rugs and other things on the floor that can make you trip. What can I do in the bedroom?  Use night lights.  Make sure that you have a light by your bed that is easy to reach.  Do not use any sheets or blankets that are too big for your bed. They should not hang down onto the floor.  Have a firm chair that has side arms. You can use this for support while you get dressed.  Do not have throw rugs and other things on the floor that can make you trip. What can I do in the kitchen?  Clean up any spills right away.  Avoid walking on wet floors.  Keep items that you use a lot in  easy-to-reach places.  If you need to reach something above you, use a strong step stool that has a grab bar.  Keep electrical cords out of the way.  Do not use floor polish or wax that makes floors slippery. If you must use wax, use non-skid floor wax.  Do not have throw rugs and other things on the floor that can make you trip. What can I do with my stairs?  Do not leave any items on the stairs.  Make sure that there are handrails on both sides of the stairs and use them. Fix handrails that are broken or loose. Make sure that handrails are as long as the stairways.  Check any carpeting to make sure that it is firmly attached to the stairs. Fix any carpet that is loose or worn.  Avoid having throw rugs at the top or bottom of the stairs. If you do have throw rugs, attach them to the floor with carpet tape.  Make sure that you have a light switch at the top of the stairs and the  bottom of the stairs. If you do not have them, ask someone to add them for you. What else can I do to help prevent falls?  Wear shoes that:  Do not have high heels.  Have rubber bottoms.  Are comfortable and fit you well.  Are closed at the toe. Do not wear sandals.  If you use a stepladder:  Make sure that it is fully opened. Do not climb a closed stepladder.  Make sure that both sides of the stepladder are locked into place.  Ask someone to hold it for you, if possible.  Clearly mark and make sure that you can see:  Any grab bars or handrails.  First and last steps.  Where the edge of each step is.  Use tools that help you move around (mobility aids) if they are needed. These include:  Canes.  Walkers.  Scooters.  Crutches.  Turn on the lights when you go into a dark area. Replace any light bulbs as soon as they burn out.  Set up your furniture so you have a clear path. Avoid moving your furniture around.  If any of your floors are uneven, fix them.  If there are any pets around you, be aware of where they are.  Review your medicines with your doctor. Some medicines can make you feel dizzy. This can increase your chance of falling. Ask your doctor what other things that you can do to help prevent falls. This information is not intended to replace advice given to you by your health care provider. Make sure you discuss any questions you have with your health care provider. Document Released: 12/11/2008 Document Revised: 07/23/2015 Document Reviewed: 03/21/2014 Elsevier Interactive Patient Education  2017 [employer].

## 2019-03-23 ENCOUNTER — Ambulatory Visit: Payer: Medicare Other | Attending: Internal Medicine

## 2019-03-23 DIAGNOSIS — Z23 Encounter for immunization: Secondary | ICD-10-CM | POA: Insufficient documentation

## 2019-03-23 NOTE — Progress Notes (Signed)
   Covid-19 Vaccination Clinic  Name:  Mark [name]    MRN: [id number] DOB: [birth year]  03/23/2019  [name] was observed post Covid-19 immunization for 15 minutes without incidence. He was provided with Vaccine Information Sheet and instruction to access the V-Safe system.   [name] was instructed to call 911 with any severe reactions post vaccine: [name] Kitchen Difficulty breathing  . Swelling of your face and throat  . A fast heartbeat  . A bad rash all over your body  . Dizziness and weakness    Immunizations Administered    Name Date Dose VIS Date Route   Pfizer COVID-19 Vaccine 03/23/2019  2:59 PM 0.3 mL 02/08/2019 Intramuscular   Manufacturer: [place]   Lot: [id number]   [place]: [id number]

## 2019-03-27 ENCOUNTER — Other Ambulatory Visit: Payer: Self-pay | Admitting: Family Medicine

## 2019-03-27 ENCOUNTER — Telehealth: Payer: Self-pay

## 2019-03-27 MED ORDER — TRAMADOL HCL 50 MG PO TABS: 50.0000 mg | ORAL_TABLET | Freq: Every day | ORAL | 2 refills | Status: DC | PRN

## 2019-03-27 MED ORDER — SIMVASTATIN 20 MG PO TABS: 20.0000 mg | ORAL_TABLET | Freq: Every day | ORAL | 3 refills | Status: DC

## 2019-03-27 NOTE — Addendum Note (Signed)
Addended by: Francella Solian on: 03/27/2019 01:28 PM   Modules accepted: Orders

## 2019-03-27 NOTE — Telephone Encounter (Signed)
Patient called in because traMADol (ULTRAM) 50 MG tablet needs an authorization in order for the patient to get medication

## 2019-03-27 NOTE — Telephone Encounter (Signed)
  LAST APPOINTMENT DATE: 03/11/2019   NEXT APPOINTMENT DATE:@2 /24/2021  MEDICATION: simvastatin (ZOCOR) 20 MG tablet traMADol (ULTRAM) 50 MG tablet   PHARMACY:  [id number] - [place], [place] - [address] AT [facility] Phone:  [phone]  Fax:  [phone]     Patient needs a 30 day supply for both.   **Let patient know to contact pharmacy at the end of the day to make sure medication is ready. **  ** Please notify patient to allow 48-72 hours to process**  **Encourage patient to contact the pharmacy for refills or they can request refills through [facility]**  CLINICAL FILLS OUT ALL BELOW:   LAST REFILL:  QTY:  REFILL DATE:    OTHER COMMENTS:    Okay for refill?  Please advise

## 2019-03-28 NOTE — Telephone Encounter (Signed)
Refilled 03/27/19

## 2019-04-01 ENCOUNTER — Other Ambulatory Visit: Payer: Self-pay | Admitting: Family Medicine

## 2019-04-07 ENCOUNTER — Other Ambulatory Visit: Payer: Self-pay | Admitting: Family Medicine

## 2019-04-14 ENCOUNTER — Ambulatory Visit: Payer: Medicare Other | Attending: Internal Medicine

## 2019-04-14 DIAGNOSIS — Z23 Encounter for immunization: Secondary | ICD-10-CM

## 2019-04-14 NOTE — Progress Notes (Signed)
   Covid-19 Vaccination Clinic  Name:  Mark [name]    MRN: [id number] DOB: 1950/03/17  04/14/2019  [name] was observed post Covid-19 immunization for 15 minutes without incidence. He was provided with Vaccine Information Sheet and instruction to access the V-Safe system.   [name] was instructed to call 911 with any severe reactions post vaccine: [name] Kitchen Difficulty breathing  . Swelling of your face and throat  . A fast heartbeat  . A bad rash all over your body  . Dizziness and weakness    Immunizations Administered    Name Date Dose VIS Date Route   Pfizer COVID-19 Vaccine 04/14/2019 12:46 PM 0.3 mL 02/08/2019 Intramuscular   Manufacturer: [place]   Lot: [id number]   [place]: [id number]

## 2019-04-22 ENCOUNTER — Other Ambulatory Visit: Payer: Self-pay | Admitting: Family Medicine

## 2019-04-23 ENCOUNTER — Other Ambulatory Visit: Payer: Self-pay

## 2019-04-23 ENCOUNTER — Encounter: Payer: Self-pay | Admitting: Family Medicine

## 2019-04-23 ENCOUNTER — Ambulatory Visit (INDEPENDENT_AMBULATORY_CARE_PROVIDER_SITE_OTHER): Payer: Medicare Other | Admitting: Family Medicine

## 2019-04-23 VITALS — BP 130/70 | HR 72 | Temp 97.5°F | Ht 71.0 in | Wt 209.4 lb

## 2019-04-23 DIAGNOSIS — G43109 Migraine with aura, not intractable, without status migrainosus: Secondary | ICD-10-CM | POA: Diagnosis not present

## 2019-04-23 DIAGNOSIS — E785 Hyperlipidemia, unspecified: Secondary | ICD-10-CM

## 2019-04-23 DIAGNOSIS — J61 Pneumoconiosis due to asbestos and other mineral fibers: Secondary | ICD-10-CM | POA: Diagnosis not present

## 2019-04-23 DIAGNOSIS — I10 Essential (primary) hypertension: Secondary | ICD-10-CM | POA: Diagnosis not present

## 2019-04-23 MED ORDER — GABAPENTIN 300 MG PO CAPS: 900.0000 mg | ORAL_CAPSULE | Freq: Every day | ORAL | 1 refills | Status: DC

## 2019-04-23 MED ORDER — BUTALBITAL-APAP-CAFFEINE 50-325-40 MG PO TABS: ORAL_TABLET | ORAL | 5 refills | Status: DC

## 2019-04-23 NOTE — Patient Instructions (Addendum)
Make sure you are taking atorvastatin 20 mg. You should stop taking simvastatin- looks like simvastatin got mistakenly refilled- you only need to be on the stronger atorvastatin 20mg .   Mark [name]- make a copy of his HCPOA and have put in chart.

## 2019-04-23 NOTE — Progress Notes (Signed)
Phone [phone] In person visit   Subjective:   Mark [name] is a 69 y.o. year old very pleasant male patient who presents for/with See problem oriented charting Chief Complaint  Patient presents with  . Follow-up  . Hyperlipidemia   This visit occurred during the SARS-CoV-2 public health emergency.  Safety protocols were in place, including screening questions prior to the visit, additional usage of staff PPE, and extensive cleaning of exam room while observing appropriate contact time as indicated for disinfecting solutions.   Past Medical History-  Patient Active Problem List   Diagnosis Date Noted  . Asbestosis ([place]) 02/05/2016    Priority: High  . Back pain 10/09/2013    Priority: High  . Anxiety state 09/01/2009    Priority: High  . Migraine headache 03/07/2007    Priority: High  . Gastroparesis 05/01/2009    Priority: Medium  . GERD 04/02/2009    Priority: Medium  . Hyperlipidemia 06/11/2007    Priority: Medium  . Asthma 03/12/2007    Priority: Medium  . Essential hypertension 03/07/2007    Priority: Medium  . PROSTATE CANCER, HX OF 03/07/2007    Priority: Medium  . Allergic rhinitis 11/22/2013    Priority: Low  . Overweight 01/10/2012    Priority: Low  . HIATAL HERNIA 04/28/2009    Priority: Low  . History of adenomatous polyp of colon 09/08/2017    Medications- reviewed and updated Current Outpatient Medications  Medication Sig Dispense Refill  . albuterol (PROAIR HFA) 108 (90 Base) MCG/ACT inhaler INHALE 2 PUFFS EVERY 6 HOURS AS NEEDED FOR WHEEZING OR SHORTNESS OF BREATH 76.5 g 3  . amLODipine (NORVASC) 2.5 MG tablet TAKE 1 TABLET BY MOUTH EVERY DAY 90 tablet 1  . atorvastatin (LIPITOR) 20 MG tablet Take 1 tablet (20 mg total) by mouth daily. Pt is taking at bedtime 90 tablet 2  . azelastine (ASTELIN) 0.1 % nasal spray PLACE 2 SPRAYS INTO BOTH NOSTRILS DAILY 30 mL 1  . butalbital-acetaminophen-caffeine (FIORICET) 50-325-40 MG tablet TAKE 1  TABLET BY MOUTH DAILY AS NEEDED. 1 refill per month. 10 tablet 5  . cyclobenzaprine (FLEXERIL) 10 MG tablet TAKE 0.5-1 TABLETS BY MOUTH 3 (THREE) TIMES DAILY AS NEEDED FOR MUSCLE SPASMS. 30 tablet 5  . fexofenadine (ALLEGRA) 180 MG tablet Patient is taking 2 times daily 90 tablet 1  . fluticasone (FLONASE) 50 MCG/ACT nasal spray USE ONE SPRAY AS DIRECTED ONCE DAILY 16 g 2  . gabapentin (NEURONTIN) 300 MG capsule Take 3 capsules (900 mg total) by mouth at bedtime. 30 capsule 1  . montelukast (SINGULAIR) 10 MG tablet TAKE 1 TABLET BY MOUTH EVERYDAY AT BEDTIME 90 tablet 3  . omeprazole (PRILOSEC) 40 MG capsule TAKE 1 CAPSULE (40 MG TOTAL) BY MOUTH 2 (TWO) TIMES DAILY BEFORE A MEAL. 180 capsule 1  . ondansetron (ZOFRAN) 4 MG tablet Take 1 tablet (4 mg total) by mouth every 8 (eight) hours as needed for nausea or vomiting. 30 tablet 3  . PARoxetine (PAXIL) 20 MG tablet TAKE 1 TABLET BY MOUTH EVERYDAY AT BEDTIME 90 tablet 0  . Polyethylene Glycol 3350 (MIRALAX PO) Take by mouth. Miralax 238 gm bowel prep-Take as directed    . promethazine (PHENERGAN) 12.5 MG tablet Take 1 tablet (12.5 mg total) by mouth daily. As needed 30 tablet 3  . propranolol-hydrochlorothiazide (INDERIDE) 40-25 MG tablet Take 1 tablet by mouth 2 (two) times daily. 180 tablet 1  . traMADol (ULTRAM) 50 MG tablet Take 1 tablet (50 mg  total) by mouth daily as needed. 30 tablet 2  . VESICARE 5 MG tablet Take 1 tablet by mouth daily.     No current facility-administered medications for this visit.     Objective:  BP 130/70   Pulse 72   Temp (!) 97.5 F (36.4 C)   Ht 5\' 11"  (1.803 m)   Wt 209 lb 6.4 oz (95 kg)   SpO2 95%   BMI 29.21 kg/m  Gen: NAD, resting comfortably CV: RRR no murmurs rubs or gallops Lungs: CTAB no crackles, wheeze, rhonchi Ext: no edema Skin: warm, dry    Assessment and Plan   #hyperlipidemia S: compliant with Lipitor 20Mg , Simvastatin 20mg . Plan was to only be on atorvastatin 20 mg/lipitor 20mg - but  appears there was an erroneous refill and he is on both Lab Results  Component Value Date   CHOL 135 10/22/2018   HDL 40.80 10/22/2018   LDLCALC 60 10/22/2018   LDLDIRECT 75.0 08/07/2015   TRIG 173.0 (H) 10/22/2018   CHOLHDL 3 10/22/2018   A/P: likely good control but only want him on 1 statin. from avs "Make sure you are taking atorvastatin 20 mg. You should stop taking simvastatin- looks like simvastatin got mistakenly refilled- you only need to be on the stronger atorvastatin 20mg . "  #Migraine headaches S:Patient has been taking Fioricet for some time for migraine headaches-no substantial relief.  Also on propranolol as part of blood pressure medication which may reduce frequency of migraine.  He reports he is taking less than 10 a month- but he had question about why # was reduced to #10 instead of #30  A/P: good control- explained to patient reasoning for reducing to #10 a month to reflect how he was taking medicine.    #hypertension S: compliant with amlodipine 2.5 mg, propranolol hctz 40-25mg  BID (had been on since before he saw me). Pharmacy is having to split dose so propranolol 40mg  and hctz 25mg - Takes both twice a day BP Readings from Last 3 Encounters:  04/23/19 130/70  02/10/19 (!) 142/85  12/17/18 130/80  A/P: Stable. Continue current medications.  -could consider reducing hctz to once a day and see if blood pressure remains stable. He declines this as reports if he misses 2nd dose feels slightly off  # Low back pain S:takes tramadol for pain depending on how active he is and how much he lifts. Also uses gabapentin at bedtime if back really bothering him- usually worse at bedtime and takes 900mg  sparingly A/P: ongoing issues- no recent worsening. im ok with refills of these meds for up to 6 months from today   # asbestosis- class II- patient uses sparing albuterol but otherwise is stable. Follows yearly with Dr. Charlett Blake  # subconjunctival hermorrhage in December-  just woke up with this- had no injury to the eye prior- treated by eye doctor- has slowly improved. Discussed does have very mild lingering redness- can follow up with eye doctor   #HM- arm swelling on first covid shot- was much more mild 2nd time but had more fatigue. Thrilled he is fully vaccinated  Recommended follow up: Return in about 6 months (around 10/21/2019) for follow up- or sooner if needed. Future Appointments  Date Time Provider [place]  10/22/2019  8:40 AM Marin Olp, MD [facility]    Lab/Order associations:   ICD-10-CM   1. Hyperlipidemia, unspecified hyperlipidemia type  E78.5   2. Migraine with aura and without status migrainosus, not intractable  G43.109  3. Essential hypertension  I10   4. Asbestosis [facility]) Chronic J61     Meds ordered this encounter  Medications  . butalbital-acetaminophen-caffeine (FIORICET) 50-325-40 MG tablet    Sig: TAKE 1 TABLET BY MOUTH DAILY AS NEEDED. 1 refill per month.    Dispense:  10 tablet    Refill:  5  . gabapentin (NEURONTIN) 300 MG capsule    Sig: Take 3 capsules (900 mg total) by mouth at bedtime.    Dispense:  30 capsule    Refill:  1   Return precautions advised.  Garret Reddish, MD

## 2019-04-24 ENCOUNTER — Ambulatory Visit: Payer: Medicare Other | Admitting: Family Medicine

## 2019-07-14 ENCOUNTER — Other Ambulatory Visit: Payer: Self-pay | Admitting: Family Medicine

## 2019-09-12 DIAGNOSIS — Z8546 Personal history of malignant neoplasm of prostate: Secondary | ICD-10-CM | POA: Diagnosis not present

## 2019-09-17 ENCOUNTER — Other Ambulatory Visit: Payer: Self-pay | Admitting: Family Medicine

## 2019-09-17 LAB — PSA: PSA: 0.015

## 2019-09-20 DIAGNOSIS — Z8546 Personal history of malignant neoplasm of prostate: Secondary | ICD-10-CM | POA: Diagnosis not present

## 2019-09-20 DIAGNOSIS — N5201 Erectile dysfunction due to arterial insufficiency: Secondary | ICD-10-CM | POA: Diagnosis not present

## 2019-09-20 DIAGNOSIS — R350 Frequency of micturition: Secondary | ICD-10-CM | POA: Diagnosis not present

## 2019-10-13 ENCOUNTER — Other Ambulatory Visit: Payer: Self-pay | Admitting: Family Medicine

## 2019-10-14 NOTE — Patient Instructions (Addendum)
Health Maintenance Due  Topic Date Due  . INFLUENZA VACCINE - - will complete later in flu season (please let us know if you get this at another location so we can update your chart) . We should have vaccination here in 1-2 months - can call back for an appointment.   09/29/2019   Reflux is reasonably well controlled on prilosec 40mg -would use pepcid as add on if needed- lower long term risk  Start new inhaler symbicort everyday as long as its affordable- twice a day. We are going to try to reduce your need for albuterol.   For avs:  Asthma control goals:   Full participation in all desired activities (may need albuterol before activity)  Albuterol use two time or less a week on average (not counting use with activity)  Cough interfering with sleep two time or less a month  Oral steroids no more than once a year  No hospitalizations  You had 3 very itchy skin tags removed today. leave bandage in place for 24 hours. May then remove and wash with soapy water but not scrub. After bathing, reapply triple antibiotic ointment and bandaid.  If it bleeds- apply pressure up to 30 minutes to an hour- seek care if continues to bleed past that point. If you have expanding redness or worsening pain then let us reevaluate the spot ASAP

## 2019-10-14 NOTE — Progress Notes (Signed)
Phone [phone] In person visit   Subjective:   Mark [name] is a 69 y.o. year old very pleasant male patient who presents for/with See problem oriented charting Chief Complaint  Patient presents with  . Asthma  . Anxiety    This visit occurred during the SARS-CoV-2 public health emergency.  Safety protocols were in place, including screening questions prior to the visit, additional usage of staff PPE, and extensive cleaning of exam room while observing appropriate contact time as indicated for disinfecting solutions.   Past Medical History-  Patient Active Problem List   Diagnosis Date Noted  . Asbestosis ([place]) 02/05/2016    Priority: High  . Back pain 10/09/2013    Priority: High  . Anxiety state 09/01/2009    Priority: High  . Migraine headache 03/07/2007    Priority: High  . Gastroparesis 05/01/2009    Priority: Medium  . GERD 04/02/2009    Priority: Medium  . Hyperlipidemia 06/11/2007    Priority: Medium  . Asthma 03/12/2007    Priority: Medium  . Essential hypertension 03/07/2007    Priority: Medium  . PROSTATE CANCER, HX OF 03/07/2007    Priority: Medium  . Allergic rhinitis 11/22/2013    Priority: Low  . Overweight 01/10/2012    Priority: Low  . HIATAL HERNIA 04/28/2009    Priority: Low  . History of adenomatous polyp of colon 09/08/2017    Medications- reviewed and updated Current Outpatient Medications  Medication Sig Dispense Refill  . albuterol (PROAIR HFA) 108 (90 Base) MCG/ACT inhaler INHALE 2 PUFFS EVERY 6 HOURS AS NEEDED FOR WHEEZING OR SHORTNESS OF BREATH 76.5 g 3  . amLODipine (NORVASC) 2.5 MG tablet TAKE 1 TABLET BY MOUTH EVERY DAY 90 tablet 1  . atorvastatin (LIPITOR) 20 MG tablet Take 1 tablet (20 mg total) by mouth daily. Pt is taking at bedtime 90 tablet 2  . Azelastine HCl 137 MCG/SPRAY SOLN SPRAY 2 SPRAYS INTO EACH NOSTRIL EVERY DAY 30 mL 1  . butalbital-acetaminophen-caffeine (FIORICET) 50-325-40 MG tablet TAKE 1 TABLET BY  MOUTH DAILY AS NEEDED. 1 refill per month. 10 tablet 5  . cyclobenzaprine (FLEXERIL) 10 MG tablet TAKE 0.5-1 TABLETS BY MOUTH 3 (THREE) TIMES DAILY AS NEEDED FOR MUSCLE SPASMS. 30 tablet 5  . fexofenadine (ALLEGRA) 180 MG tablet Patient is taking 2 times daily 90 tablet 1  . fluticasone (FLONASE) 50 MCG/ACT nasal spray USE ONE SPRAY IN EACH NOSTRIL AS DIRECTED ONCE DAILY 16 mL 2  . gabapentin (NEURONTIN) 300 MG capsule Take 3 capsules (900 mg total) by mouth at bedtime. 30 capsule 1  . montelukast (SINGULAIR) 10 MG tablet TAKE 1 TABLET BY MOUTH EVERYDAY AT BEDTIME 90 tablet 3  . omeprazole (PRILOSEC) 40 MG capsule TAKE 1 CAPSULE (40 MG TOTAL) BY MOUTH 2 (TWO) TIMES DAILY BEFORE A MEAL. 180 capsule 1  . ondansetron (ZOFRAN) 4 MG tablet Take 1 tablet (4 mg total) by mouth every 8 (eight) hours as needed for nausea or vomiting. 30 tablet 3  . PARoxetine (PAXIL) 20 MG tablet TAKE 1 TABLET BY MOUTH EVERYDAY AT BEDTIME 90 tablet 0  . Polyethylene Glycol 3350 (MIRALAX PO) Take by mouth. Miralax 238 gm bowel prep-Take as directed    . promethazine (PHENERGAN) 12.5 MG tablet Take 1 tablet (12.5 mg total) by mouth daily. As needed 30 tablet 3  . propranolol-hydrochlorothiazide (INDERIDE) 40-25 MG tablet Take 1 tablet by mouth 2 (two) times daily. 180 tablet 1  . traMADol (ULTRAM) 50 MG tablet Take  1 tablet (50 mg total) by mouth daily as needed. 30 tablet 2  . VESICARE 5 MG tablet Take 1 tablet by mouth daily.     No current facility-administered medications for this visit.     Objective:  BP 120/70   Pulse (!) 56   Temp 98.6 F (37 C) (Temporal)   Ht 5\' 11"  (1.803 m)   Wt 200 lb (90.7 kg)   SpO2 96%   BMI 27.89 kg/m  Gen: NAD, resting comfortably CV: RRR  Lungs: nonlabored, normal respiratory rate Abdomen: soft/nondistended  Skin Tag removal Procedure Note   PRE-OP DIAGNOSIS: Pruritic skin tag removal x3 POST-OP DIAGNOSIS: Same  Size of lesion: each tag at least 3-46mm in length. Largest  had a small stalk but then 5 mm width.  Location of lesion right upper back x1, left upper back x 1, left lower back x 1  PROCEDURE:  Removal Completed today x 3. 0.5 cc of lidocaine with epinephrine used to anesthetize. Then betadine used to further cleanse before using pick ups to pick up lesion and dermablade to cut the base. Hemostasis achieved with drysol. Patient tolerated procedure well. Verbal consent before visit and post procedure aftercare was discussed.    Followup: The patient tolerated the procedure well without complications.  Standard post-procedure care is explained and return precautions are given.      Assessment and Plan   # Asthma S: Maintenance Medication: Albuterol Inhaler, Singulair 10Mg . He is followed by Pulmonology.  As needed medication: Albuterol . Patient is using this 10x per week.  A/P: I am going to add in symbicort given poor control. Will continue albuterol as needed- goal twice a week or less.    #Asbestosis class II-patient using sparing albuterol but otherwise stable.  Follows yearly with Dr. Charlett Blake. Has appointment next month  # Anxiety S:Medication: Paxil . No SI A/P: reasonable control- continue current medicines   # GERD S:Prilosec 40 mg down to once a day- occasional reflux with this. Resolves with twice aday B12 levels related to PPI use:  Lab Results  Component Value Date   VITAMINB12 238 10/22/2018  A/P: Reflux is reasonably well controlled on prilosec 40mg -would use pepcid as add on if needed- lower long term risk -Last visit recommended at least 2050 mcg of B12 daily-repeat B12 needed today. He is on b12 but not sure of dose  #hypertension S: medication: Inderide 40-25Mg  BID (have considered changing to once a day but he has declined in past), amlodipine 2.5Mg  Home readings #s:  Home readings have been good at home per his report BP Readings from Last 3 Encounters:  10/22/19 120/70  04/23/19 130/70  02/10/19 (!) 142/85   A/P: Excellent control-continue current medications  #hyperlipidemia S: Medication:atorvastatin 20mg   Lab Results  Component Value Date   CHOL 135 10/22/2018   HDL 40.80 10/22/2018   LDLCALC 60 10/22/2018   LDLDIRECT 75.0 08/07/2015   TRIG 173.0 (H) 10/22/2018   CHOLHDL 3 10/22/2018   A/P: Hopefully controlled-update lipid panel today.  Please note last visit simvastatin was erroneously refilled and patient was on simvastatin and atorvastatin-he has only on atorvastatin at this time  #Migraines S: Propranolol for blood pressure control also likely helps reduce frequency of migraines.  He is currently using Fioricet less than 10 times a month- usually twice a week  A/P: reasonable control- continue current medicine    #Low back pain S: Patient uses gabapentin at bedtime if his back is really bothering him.  Patient also uses tramadol sparingly if he is really active. A/P: Reasonable control-okay to refill medicines for up to 6 months from today  #Prostate cancer history-robotic prostatectomy with Dr. Alinda Money in the past. States just saw him last month and PSA was good- they will send [place] results Lab Results  Component Value Date   PSA 0.015 09/05/2017   PSA 0.00 (L) 06/07/2016   PSA 0.01 (L) 04/24/2014   # Skin tags S:skin tags x3 on back for several years. They have begun to be progressively irritating- regularly itchy/intensely pruritic.  A/P: due to irritation (intense pruritis) removal was performed today x3    Recommended follow up: Return in about 6 months (around 04/23/2020) for follow up- or sooner if needed.  Lab/Order associations: fasting   ICD-10-CM   1. Essential hypertension  I10   2. Mild intermittent asthma without complication  O67.12   3. Gastroesophageal reflux disease with esophagitis without hemorrhage  K21.00   4. Anxiety state  F41.1   5. Hyperlipidemia, unspecified hyperlipidemia type  E78.5     No orders of the defined types were placed in this  encounter.   Return precautions advised.  Garret Reddish, MD

## 2019-10-16 ENCOUNTER — Other Ambulatory Visit: Payer: Self-pay | Admitting: Family Medicine

## 2019-10-22 ENCOUNTER — Other Ambulatory Visit: Payer: Self-pay

## 2019-10-22 ENCOUNTER — Ambulatory Visit (INDEPENDENT_AMBULATORY_CARE_PROVIDER_SITE_OTHER): Payer: Medicare Other | Admitting: Family Medicine

## 2019-10-22 ENCOUNTER — Encounter: Payer: Self-pay | Admitting: Family Medicine

## 2019-10-22 VITALS — BP 120/70 | HR 56 | Temp 98.6°F | Ht 71.0 in | Wt 200.0 lb

## 2019-10-22 DIAGNOSIS — J4520 Mild intermittent asthma, uncomplicated: Secondary | ICD-10-CM | POA: Diagnosis not present

## 2019-10-22 DIAGNOSIS — Z79899 Other long term (current) drug therapy: Secondary | ICD-10-CM | POA: Diagnosis not present

## 2019-10-22 DIAGNOSIS — I10 Essential (primary) hypertension: Secondary | ICD-10-CM

## 2019-10-22 DIAGNOSIS — K2100 Gastro-esophageal reflux disease with esophagitis, without bleeding: Secondary | ICD-10-CM

## 2019-10-22 DIAGNOSIS — F411 Generalized anxiety disorder: Secondary | ICD-10-CM | POA: Diagnosis not present

## 2019-10-22 DIAGNOSIS — E785 Hyperlipidemia, unspecified: Secondary | ICD-10-CM

## 2019-10-22 DIAGNOSIS — L918 Other hypertrophic disorders of the skin: Secondary | ICD-10-CM

## 2019-10-22 LAB — LIPID PANEL (REFL)
Cholesterol: 129 mg/dL (ref <200)
HDL: 46 mg/dL (ref >=40)
LDL Cholesterol (Calc): 64 mg/dL (calc)
Non-HDL Cholesterol (Calc): 83 mg/dL (calc) (ref <130)
Total CHOL/HDL Ratio: 2.8 (calc) (ref <5.0)
Triglycerides: 105 mg/dL (ref <150)

## 2019-10-22 LAB — COMPLETE METABOLIC PANEL WITH GFR
AG Ratio: 1.6 (calc) (ref 1.0–2.5)
ALT: 22 U/L (ref 9–46)
AST: 18 U/L (ref 10–35)
Albumin: 4.1 g/dL (ref 3.6–5.1)
Alkaline phosphatase (APISO): 81 U/L (ref 35–144)
BUN: 8 mg/dL (ref 7–25)
CO2: 30 mmol/L (ref 20–32)
Calcium: 9.5 mg/dL (ref 8.6–10.3)
Chloride: 101 mmol/L (ref 98–110)
Creat: 0.84 mg/dL (ref 0.70–1.25)
GFR, Est African American: 104 mL/min/{1.73_m2} (ref >=60)
GFR, Est Non African American: 90 mL/min/{1.73_m2} (ref >=60)
Globulin: 2.6 g/dL (calc) (ref 1.9–3.7)
Glucose, Bld: 86 mg/dL (ref 65–99)
Potassium: 4.4 mmol/L (ref 3.5–5.3)
Sodium: 138 mmol/L (ref 135–146)
Total Bilirubin: 0.8 mg/dL (ref 0.2–1.2)
Total Protein: 6.7 g/dL (ref 6.1–8.1)

## 2019-10-22 LAB — VITAMIN B12: Vitamin B-12: 384 pg/mL (ref 200–1100)

## 2019-10-22 LAB — CBC WITH DIFFERENTIAL/PLATELET
Absolute Monocytes: 761 cells/uL (ref 200–950)
Basophils Absolute: 61 cells/uL (ref 0–200)
Basophils Relative: 1.3 %
Eosinophils Absolute: 150 cells/uL (ref 15–500)
Eosinophils Relative: 3.2 %
HCT: 46.3 % (ref 38.5–50.0)
Hemoglobin: 15.7 g/dL (ref 13.2–17.1)
Lymphs Abs: 1481 cells/uL (ref 850–3900)
MCH: 31.9 pg (ref 27.0–33.0)
MCHC: 33.9 g/dL (ref 32.0–36.0)
MCV: 94.1 fL (ref 80.0–100.0)
MPV: 9.4 fL (ref 7.5–12.5)
Monocytes Relative: 16.2 %
Neutro Abs: 2247 cells/uL (ref 1500–7800)
Neutrophils Relative %: 47.8 %
Platelets: 294 10*3/uL (ref 140–400)
RBC: 4.92 10*6/uL (ref 4.20–5.80)
RDW: 11.9 % (ref 11.0–15.0)
Total Lymphocyte: 31.5 %
WBC: 4.7 10*3/uL (ref 3.8–10.8)

## 2019-10-22 MED ORDER — PROPRANOLOL-HCTZ 40-25 MG PO TABS: 1.0000 | ORAL_TABLET | Freq: Two times a day (BID) | ORAL | 3 refills | Status: DC

## 2019-10-22 MED ORDER — OMEPRAZOLE 40 MG PO CPDR: DELAYED_RELEASE_CAPSULE | ORAL | 3 refills | Status: DC

## 2019-10-22 MED ORDER — BUDESONIDE-FORMOTEROL FUMARATE 160-4.5 MCG/ACT IN AERO: 2.0000 | INHALATION_SPRAY | Freq: Two times a day (BID) | RESPIRATORY_TRACT | 11 refills | Status: DC

## 2019-10-22 MED ORDER — ALBUTEROL SULFATE HFA 108 (90 BASE) MCG/ACT IN AERS: INHALATION_SPRAY | RESPIRATORY_TRACT | 3 refills | Status: DC

## 2019-10-22 MED ORDER — FLUTICASONE PROPIONATE 50 MCG/ACT NA SUSP: NASAL | 3 refills | Status: DC

## 2019-10-22 MED ORDER — ATORVASTATIN CALCIUM 20 MG PO TABS: 20.0000 mg | ORAL_TABLET | Freq: Every day | ORAL | 3 refills | Status: DC

## 2019-10-22 NOTE — Addendum Note (Signed)
Addended by: Liliane Channel on: 10/22/2019 09:38 AM   Modules accepted: Orders

## 2019-10-23 ENCOUNTER — Encounter: Payer: Self-pay | Admitting: Family Medicine

## 2019-11-28 ENCOUNTER — Ambulatory Visit: Payer: Medicare Other | Attending: Internal Medicine

## 2019-11-28 DIAGNOSIS — Z23 Encounter for immunization: Secondary | ICD-10-CM

## 2019-11-28 NOTE — Progress Notes (Signed)
   Covid-19 Vaccination Clinic  Name:  Mark [name]    MRN: [id number] DOB: 1950-07-06  11/28/2019  Mr. [name] was observed post Covid-19 immunization for 15 minutes without incident. He was provided with Vaccine Information Sheet and instruction to access the V-Safe system.   Mr. [name] was instructed to call 911 with any severe reactions post vaccine: [name] Kitchen Difficulty breathing  . Swelling of face and throat  . A fast heartbeat  . A bad rash all over body  . Dizziness and weakness

## 2019-12-08 ENCOUNTER — Other Ambulatory Visit: Payer: Self-pay | Admitting: Family Medicine

## 2020-01-14 DIAGNOSIS — Z23 Encounter for immunization: Secondary | ICD-10-CM | POA: Diagnosis not present

## 2020-01-19 ENCOUNTER — Other Ambulatory Visit: Payer: Self-pay | Admitting: Family Medicine

## 2020-03-01 ENCOUNTER — Other Ambulatory Visit: Payer: Self-pay | Admitting: Gastroenterology

## 2020-03-09 ENCOUNTER — Encounter: Payer: Self-pay | Admitting: Gastroenterology

## 2020-03-09 ENCOUNTER — Ambulatory Visit (INDEPENDENT_AMBULATORY_CARE_PROVIDER_SITE_OTHER): Payer: Medicare Other | Admitting: Gastroenterology

## 2020-03-09 VITALS — BP 122/80 | HR 61 | Ht 70.0 in | Wt 204.0 lb

## 2020-03-09 DIAGNOSIS — K5904 Chronic idiopathic constipation: Secondary | ICD-10-CM

## 2020-03-09 DIAGNOSIS — R110 Nausea: Secondary | ICD-10-CM

## 2020-03-09 DIAGNOSIS — K449 Diaphragmatic hernia without obstruction or gangrene: Secondary | ICD-10-CM | POA: Diagnosis not present

## 2020-03-09 DIAGNOSIS — K219 Gastro-esophageal reflux disease without esophagitis: Secondary | ICD-10-CM

## 2020-03-09 DIAGNOSIS — Z86010 Personal history of colonic polyps: Secondary | ICD-10-CM | POA: Diagnosis not present

## 2020-03-09 MED ORDER — PROMETHAZINE HCL 12.5 MG PO TABS: 12.5000 mg | ORAL_TABLET | Freq: Every day | ORAL | 3 refills | Status: DC

## 2020-03-09 MED ORDER — OMEPRAZOLE 40 MG PO CPDR: DELAYED_RELEASE_CAPSULE | ORAL | 3 refills | Status: DC

## 2020-03-09 MED ORDER — ONDANSETRON HCL 4 MG PO TABS: ORAL_TABLET | ORAL | 3 refills | Status: DC

## 2020-03-09 NOTE — Progress Notes (Signed)
Mark [name]    [id number]    [birth year]  Primary Care Physician:Mark [name], Mark Mars, MD  Referring Physician: Marin Olp, MD [place],  [place] [id number]   Chief complaint:  Nausea  HPI:  70 year old very pleasant gentleman here for follow-up visit for chronic GERD and nausea. Continues to have intermittent nausea, but overall his symptoms have significantly improved compared to prior.  He gets nausea if he skips breakfast but when he eats regularly he does not have as many episodes. He continues to have intermittent breakthrough heartburn, worse with greasy or heavy meals. He uses Zofran or Phenergan intermittently for nausea. His weight is stable.  Bowel habits regular with MiraLAX daily. Denies any dysphagia, odynophagia, vomiting, abdominal pain, melena or bright red blood per rectum   4 hours gastric emptying scan 2018: Normal HIDA scan: Normal 65% gallbladder ejection fraction EGD February 2011: Mild gastritis and esophagitis otherwise normal exam Colonoscopy 2014: Removal of tubular adenoma Colonoscopy August 2019 with removal of 6 sessile polyps [tubular adenoma and sessile serrated adenomas], recall colonoscopy in 3 years.   Outpatient Encounter Medications as of 03/09/2020  Medication Sig  . albuterol (PROAIR HFA) 108 (90 Base) MCG/ACT inhaler INHALE 2 PUFFS EVERY 6 HOURS AS NEEDED FOR WHEEZING OR SHORTNESS OF BREATH  . amLODipine (NORVASC) 2.5 MG tablet TAKE 1 TABLET BY MOUTH EVERY DAY  . atorvastatin (LIPITOR) 20 MG tablet Take 1 tablet (20 mg total) by mouth daily. Pt is taking at bedtime  . azelastine (ASTELIN) 0.1 % nasal spray PLACE 2 SPRAYS INTO EACH NOSTRIL DAILY  . budesonide-formoterol (SYMBICORT) 160-4.5 MCG/ACT inhaler Inhale 2 puffs into the lungs 2 (two) times daily.  . butalbital-acetaminophen-caffeine (FIORICET) 50-325-40 MG tablet TAKE 1 TABLET BY MOUTH DAILY AS NEEDED. 1 refill per month.  . cyclobenzaprine  (FLEXERIL) 10 MG tablet TAKE 0.5-1 TABLETS BY MOUTH 3 (THREE) TIMES DAILY AS NEEDED FOR MUSCLE SPASMS.  . fexofenadine (ALLEGRA) 180 MG tablet Patient is taking 2 times daily  . fluticasone (FLONASE) 50 MCG/ACT nasal spray USE ONE SPRAY IN EACH NOSTRIL AS DIRECTED ONCE DAILY  . gabapentin (NEURONTIN) 300 MG capsule Take 3 capsules (900 mg total) by mouth at bedtime.  . hydrochlorothiazide (HYDRODIURIL) 25 MG tablet TAKE 1 TABLET BY MOUTH TWICE A DAY  . montelukast (SINGULAIR) 10 MG tablet TAKE 1 TABLET BY MOUTH EVERYDAY AT BEDTIME  . omeprazole (PRILOSEC) 40 MG capsule TAKE 1 CAPSULE (40 MG TOTAL) BY MOUTH DAILY BEFORE A MEAL.  [name] Kitchen ondansetron (ZOFRAN) 4 MG tablet TAKE 1 TABLET BY MOUTH EVERY 8 HOURS AS NEEDED FOR NAUSEA AND VOMITING  . PARoxetine (PAXIL) 20 MG tablet TAKE 1 TABLET BY MOUTH EVERYDAY AT BEDTIME  . Polyethylene Glycol 3350 (MIRALAX PO) Take by mouth. Miralax 238 gm bowel prep-Take as directed  . promethazine (PHENERGAN) 12.5 MG tablet Take 1 tablet (12.5 mg total) by mouth daily. As needed  . propranolol (INDERAL) 40 MG tablet TAKE 1 TABLET BY MOUTH TWICE A DAY  . traMADol (ULTRAM) 50 MG tablet Take 1 tablet (50 mg total) by mouth daily as needed.  . VESICARE 5 MG tablet Take 1 tablet by mouth daily.  . [DISCONTINUED] propranolol-hydrochlorothiazide (INDERIDE) 40-25 MG tablet Take 1 tablet by mouth 2 (two) times daily.   No facility-administered encounter medications on file as of 03/09/2020.    Allergies as of 03/09/2020 - Review Complete 10/22/2019  Allergen Reaction Noted  . Propoxyphene hcl  Nausea And Vomiting 03/27/2006    Past Medical History:  Diagnosis Date  . Allergy    seasonal  . Anxiety state, unspecified 09/01/2009  . ASTHMA 03/12/2007  . Asthma   . Cancer [name]) 2008   prostate cancer  . Gastroparesis 05/01/2009  . GERD 04/02/2009  . Headache(784.0) 03/07/2007  . HIATAL HERNIA 04/28/2009  . Hiatal hernia   . HYPERLIPIDEMIA 06/11/2007  . HYPERTENSION 03/07/2007  .  Pneumonia    hx of  . Polyp of colon, hyperplastic   . PROSTATE CANCER, HX OF 03/07/2007  . Suspected exposure to asbestos    have asbestosis testing every year  . TRANSAMINASES, SERUM, ELEVATED 04/28/2009  . Tubular adenoma of colon   . Ulcer    as teenager    Past Surgical History:  Procedure Laterality Date  . APPENDECTOMY    . BACK SURGERY  05/2011  . COLONOSCOPY    . LUMBAR LAMINECTOMY/DECOMPRESSION MICRODISCECTOMY N/A 10/09/2013   Procedure: L4-5 DECOMPRESSION/FARAMINOTOMY REVISION ;  Surgeon: Mark Schools, MD;  Location: [name];  Service: Orthopedics;  Laterality: N/A;  . PROSTATECTOMY  07/10/06  . ROTATOR CUFF REPAIR     9/09 on right  . TRANSFORAMINAL LUMBAR INTERBODY FUSION (TLIF) WITH PEDICLE SCREW FIXATION 1 LEVEL N/A 10/09/2013   Procedure: TLIF L5-S1;  Surgeon: Mark Schools, MD;  Location: [name];  Service: Orthopedics;  Laterality: N/A;    Family History  Problem Relation Age of Onset  . Pancreatic cancer Mother 97  . Hypertension Father   . Heart attack Father        19  . Hypertension Brother        x 2  . Hypertension Brother   . Kidney disease Brother   . Hypertension Brother   . Multiple sclerosis Sister   . Heart disease Sister   . Heart attack Sister   . Hypertension Son   . Heart attack Maternal Grandmother   . Heart attack Paternal Grandmother   . Heart attack Paternal Grandfather   . Colon cancer Neg Hx   . Rectal cancer Neg Hx   . Stomach cancer Neg Hx     Social History   Socioeconomic History  . Marital status: Married    Spouse name: Not on file  . Number of children: 4  . Years of education: Not on file  . Highest education level: Not on file  Occupational History  . Occupation: Disabled  Tobacco Use  . Smoking status: Former Smoker    Packs/day: 0.50    Years: 2.00    Pack years: 1.00    Types: Cigarettes    Quit date: 04/09/1983    Years since quitting: 36.9  . Smokeless tobacco: Never Used  Vaping Use  . Vaping Use: Never  used  Substance and Sexual Activity  . Alcohol use: No    Alcohol/week: 0.0 standard drinks  . Drug use: No  . Sexual activity: Yes  Other Topics Concern  . Not on file  Social History Narrative   Married 1974. Wife-Mark [name]. 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       Retired from [employer]: hunting, boat (off for 1 year in 2015)   Social Determinants of Health   Financial Resource Strain: Not on [employer] Insecurity: Not on  file  Transportation Needs: Not on file  Physical Activity: Not on file  Stress: Not on file  Social Connections: Not on file  Intimate Partner Violence: Not on file      Review of systems: All other review of systems negative except as mentioned in the HPI.   Physical Exam: Vitals:   03/09/20 0955  BP: 122/80  Pulse: 61   Body mass index is 29.27 kg/m. Gen:      No acute distress HEENT:  sclera anicteric Abd:      soft, non-tender; no palpable masses, no distension Ext:    No edema Neuro: alert and oriented x 3 Psych: normal mood and affect  Data Reviewed:  Reviewed labs, radiology imaging, old records and pertinent past GI work up   Assessment and Plan/Recommendations:  70 year old very pleasant gentleman with history of hypertension, hyperlipidemia, anxiety disorder, hiatal hernia and chronic GERD  GERD and hiatal hernia: Continue omeprazole daily and antireflux measures  Chronic nausea: Multifactorial, uncontrolled GERD also playing a role Continue with lifestyle modifications and antireflux measures Use Phenergan or Zofran as needed for severe nausea  Chronic idiopathic constipation: Use MiraLAX 1 capful daily as needed.  Continue with high-fiber diet and increase water intake  History of advanced adenomatous colon polyps: Due for surveillance colonoscopy in August  2022  Return in 6 months or sooner if needed   The patient was provided an opportunity to ask questions and all were answered. The patient agreed with the plan and demonstrated an understanding of the instructions.  Damaris Hippo , MD    CC: Mark Olp, MD

## 2020-03-09 NOTE — Patient Instructions (Signed)
You have been scheduled for an endoscopy. Please follow written instructions given to you at your visit today. If you use inhalers (even only as needed), please bring them with you on the day of your procedure.  We will send Zofran,phenergan and omeprazole to your pharmacy    Due to recent changes in healthcare laws, you may see the results of your imaging and laboratory studies on MyChart before your provider has had a chance to review them.  We understand that in some cases there may be results that are confusing or concerning to you. Not all laboratory results come back in the same time frame and the provider may be waiting for multiple results in order to interpret others.  Please give [place] 48 hours in order for your provider to thoroughly review all the results before contacting the office for clarification of your results.   If you are age 48 or older, your body mass index should be between 23-30. Your Body mass index is 29.27 kg/m. If this is out of the aforementioned range listed, please consider follow up with your Primary Care Provider.  If you are age 36 or younger, your body mass index should be between 19-25. Your Body mass index is 29.27 kg/m. If this is out of the aformentioned range listed, please consider follow up with your Primary Care Provider.     [employer]'s Current Therapy 2021 (pp. 213-216). [place], [place]: Elsevier.">  Gastroesophageal Reflux Disease, Adult Gastroesophageal reflux (GER) happens when acid from the stomach flows up into the tube that connects the mouth and the stomach (esophagus). Normally, food travels down the esophagus and stays in the stomach to be digested. However, when a person has GER, food and stomach acid sometimes move back up into the esophagus. If this becomes a more serious problem, the person may be diagnosed with a disease called gastroesophageal reflux disease (GERD). GERD occurs when the reflux:  Happens often.  Causes frequent or severe  symptoms.  Causes problems such as damage to the esophagus. When stomach acid comes in contact with the esophagus, the acid may cause inflammation in the esophagus. Over time, GERD may create small holes (ulcers) in the lining of the esophagus. What are the causes? This condition is caused by a problem with the muscle between the esophagus and the stomach (lower esophageal sphincter, or LES). Normally, the LES muscle closes after food passes through the esophagus to the stomach. When the LES is weakened or abnormal, it does not close properly, and that allows food and stomach acid to go back up into the esophagus. The LES can be weakened by certain dietary substances, medicines, and medical conditions, including:  Tobacco use.  Pregnancy.  Having a hiatal hernia.  Alcohol use.  Certain foods and beverages, such as coffee, chocolate, onions, and peppermint. What increases the risk? You are more likely to develop this condition if you:  Have an increased body weight.  Have a connective tissue disorder.  Take NSAIDs, such as ibuprofen. What are the signs or symptoms? Symptoms of this condition include:  Heartburn.  Difficult or painful swallowing and the feeling of having a lump in the throat.  A bitter taste in the mouth.  Bad breath and having a large amount of saliva.  Having an upset or bloated stomach and belching.  Chest pain. Different conditions can cause chest pain. Make sure you see your health care provider if you experience chest pain.  Shortness of breath or wheezing.  Ongoing (chronic) cough  or a nighttime cough.  Wearing away of tooth enamel.  Weight loss. How is this diagnosed? This condition may be diagnosed based on a medical history and a physical exam. To determine if you have mild or severe GERD, your health care provider may also monitor how you respond to treatment. You may also have tests, including:  A test to examine your stomach and esophagus  with a small camera (endoscopy).  A test that measures the acidity level in your esophagus.  A test that measures how much pressure is on your esophagus.  A barium swallow or modified barium swallow test to show the shape, size, and functioning of your esophagus. How is this treated? Treatment for this condition may vary depending on how severe your symptoms are. Your health care provider may recommend:  Changes to your diet.  Medicine.  Surgery. The goal of treatment is to help relieve your symptoms and to prevent complications. Follow these instructions at home: Eating and drinking  Follow a diet as recommended by your health care provider. This may involve avoiding foods and drinks such as: ? Coffee and tea, with or without caffeine. ? Drinks that contain alcohol. ? Energy drinks and sports drinks. ? Carbonated drinks or sodas. ? Chocolate and cocoa. ? Peppermint and mint flavorings. ? Garlic and onions. ? Horseradish. ? Spicy and acidic foods, including peppers, chili powder, curry powder, vinegar, hot sauces, and barbecue sauce. ? Citrus fruit juices and citrus fruits, such as oranges, lemons, and limes. ? Tomato-based foods, such as red sauce, chili, salsa, and pizza with red sauce. ? Fried and fatty foods, such as donuts, french fries, potato chips, and high-fat dressings. ? High-fat meats, such as hot dogs and fatty cuts of red and white meats, such as rib eye steak, sausage, ham, and bacon. ? High-fat dairy items, such as whole milk, butter, and cream cheese.  Eat small, frequent meals instead of large meals.  Avoid drinking large amounts of liquid with your meals.  Avoid eating meals during the 2-3 hours before bedtime.  Avoid lying down right after you eat.  Do not exercise right after you eat.   Lifestyle  Do not use any products that contain nicotine or tobacco. These products include cigarettes, chewing tobacco, and vaping devices, such as e-cigarettes. If  you need help quitting, ask your health care provider.  Try to reduce your stress by using methods such as yoga or meditation. If you need help reducing stress, ask your health care provider.  If you are overweight, reduce your weight to an amount that is healthy for you. Ask your health care provider for guidance about a safe weight loss goal.   General instructions  Pay attention to any changes in your symptoms.  Take over-the-counter and prescription medicines only as told by your health care provider. Do not take aspirin, ibuprofen, or other NSAIDs unless your health care provider told you to take these medicines.  Wear loose-fitting clothing. Do not wear anything tight around your waist that causes pressure on your abdomen.  Raise (elevate) the head of your bed about 6 inches (15 cm). You can use a wedge to do this.  Avoid bending over if this makes your symptoms worse.  Keep all follow-up visits. This is important. Contact a health care provider if:  You have: ? New symptoms. ? Unexplained weight loss. ? Difficulty swallowing or it hurts to swallow. ? Wheezing or a persistent cough. ? A hoarse voice.  Your symptoms do  not improve with treatment. Get help right away if:  You have sudden pain in your arms, neck, jaw, teeth, or back.  You suddenly feel sweaty, dizzy, or light-headed.  You have chest pain or shortness of breath.  You vomit and the vomit is green, yellow, or black, or it looks like blood or coffee grounds.  You faint.  You have stool that is red, bloody, or black.  You cannot swallow, drink, or eat. These symptoms may represent a serious problem that is an emergency. Do not wait to see if the symptoms will go away. Get medical help right away. Call your local emergency services (911 in the [place] Do not drive yourself to the hospital. Summary  Gastroesophageal reflux happens when acid from the stomach flows up into the esophagus. GERD is a disease in  which the reflux happens often, causes frequent or severe symptoms, or causes problems such as damage to the esophagus.  Treatment for this condition may vary depending on how severe your symptoms are. Your health care provider may recommend diet and lifestyle changes, medicine, or surgery.  Contact a health care provider if you have new or worsening symptoms.  Take over-the-counter and prescription medicines only as told by your health care provider. Do not take aspirin, ibuprofen, or other NSAIDs unless your health care provider told you to do so.  Keep all follow-up visits as told by your health care provider. This is important. This information is not intended to replace advice given to you by your health care provider. Make sure you discuss any questions you have with your health care provider. Document Revised: 08/26/2019 Document Reviewed: 08/26/2019 Elsevier Patient Education  [place].  I appreciate the  opportunity to care for you  Thank You   Harl Bowie , MD

## 2020-03-25 ENCOUNTER — Telehealth: Payer: Self-pay | Admitting: Family Medicine

## 2020-03-25 NOTE — Telephone Encounter (Signed)
Left message for patient to call back and schedule Medicare Annual Wellness Visit (AWV) either virtually OR in office.   Last AWV 03/11/19 please schedule at anytime with [facility] Advisor at [facility].  This should be a 45 minute visit.

## 2020-03-30 ENCOUNTER — Encounter: Payer: Self-pay | Admitting: Gastroenterology

## 2020-04-08 ENCOUNTER — Ambulatory Visit (AMBULATORY_SURGERY_CENTER): Payer: Medicare Other | Admitting: Gastroenterology

## 2020-04-08 ENCOUNTER — Encounter: Payer: Self-pay | Admitting: Gastroenterology

## 2020-04-08 ENCOUNTER — Other Ambulatory Visit: Payer: Self-pay

## 2020-04-08 VITALS — BP 133/73 | HR 50 | Temp 97.8°F | Resp 15 | Ht 70.0 in | Wt 204.0 lb

## 2020-04-08 DIAGNOSIS — K219 Gastro-esophageal reflux disease without esophagitis: Secondary | ICD-10-CM | POA: Diagnosis not present

## 2020-04-08 DIAGNOSIS — K449 Diaphragmatic hernia without obstruction or gangrene: Secondary | ICD-10-CM

## 2020-04-08 DIAGNOSIS — R110 Nausea: Secondary | ICD-10-CM | POA: Diagnosis not present

## 2020-04-08 DIAGNOSIS — K2970 Gastritis, unspecified, without bleeding: Secondary | ICD-10-CM

## 2020-04-08 DIAGNOSIS — K2950 Unspecified chronic gastritis without bleeding: Secondary | ICD-10-CM | POA: Diagnosis not present

## 2020-04-08 DIAGNOSIS — B49 Unspecified mycosis: Secondary | ICD-10-CM | POA: Diagnosis not present

## 2020-04-08 MED ORDER — SODIUM CHLORIDE 0.9 % IV SOLN: 500.0000 mL | Freq: Once | INTRAVENOUS | Status: DC

## 2020-04-08 NOTE — Patient Instructions (Signed)
Handout given: gastritis, hiatal hernia Resume previous diet Continue current medications Await pathology results Please call GI office for the next available appointment in 2-3 months  YOU HAD AN ENDOSCOPIC PROCEDURE TODAY AT [facility] [facility]:   Refer to the procedure report that was given to you for any specific questions about what was found during the examination.  If the procedure report does not answer your questions, please call your gastroenterologist to clarify.  If you requested that your care partner not be given the details of your procedure findings, then the procedure report has been included in a sealed envelope for you to review at your convenience later.  YOU SHOULD EXPECT: Some feelings of bloating in the abdomen. Passage of more gas than usual.  Walking can help get rid of the air that was put into your GI tract during the procedure and reduce the bloating. If you had a lower endoscopy (such as a colonoscopy or flexible sigmoidoscopy) you may notice spotting of blood in your stool or on the toilet paper. If you underwent a bowel prep for your procedure, you may not have a normal bowel movement for a few days.  Please Note:  You might notice some irritation and congestion in your nose or some drainage.  This is from the oxygen used during your procedure.  There is no need for concern and it should clear up in a day or so.  SYMPTOMS TO REPORT IMMEDIATELY:   Following upper endoscopy (EGD)  Vomiting of blood or coffee ground material  New chest pain or pain under the shoulder blades  Painful or persistently difficult swallowing  New shortness of breath  Fever of 100F or higher  Black, tarry-looking stools  For urgent or emergent issues, a gastroenterologist can be reached at any hour by calling [phone]. Do not use MyChart messaging for urgent concerns.   DIET:  We do recommend a small meal at first, but then you may proceed to your regular diet.  Drink  plenty of fluids but you should avoid alcoholic beverages for 24 hours.  ACTIVITY:  You should plan to take it easy for the rest of today and you should NOT DRIVE or use heavy machinery until tomorrow (because of the sedation medicines used during the test).    FOLLOW UP: Our staff will call the number listed on your records 48-72 hours following your procedure to check on you and address any questions or concerns that you may have regarding the information given to you following your procedure. If we do not reach you, we will leave a message.  We will attempt to reach you two times.  During this call, we will ask if you have developed any symptoms of COVID 19. If you develop any symptoms (ie: fever, flu-like symptoms, shortness of breath, cough etc.) before then, please call [phone].  If you test positive for Covid 19 in the 2 weeks post procedure, please call and report this information to [place].    If any biopsies were taken you will be contacted by phone or by letter within the next 1-3 weeks.  Please call us at [phone] if you have not heard about the biopsies in 3 weeks.   SIGNATURES/CONFIDENTIALITY: You and/or your care partner have signed paperwork which will be entered into your electronic medical record.  These signatures attest to the fact that that the information above on your After Visit Summary has been reviewed and is understood.  Full responsibility of the  confidentiality of this discharge information lies with you and/or your care-partner. 

## 2020-04-08 NOTE — Op Note (Addendum)
[facility] Patient Name: Mark [name] Procedure Date: 04/08/2020 9:01 AM MRN: [id number] Endoscopist: Mauri Pole , MD Age: 70 Referring MD:  Date of Birth: [birth year] Gender: Male Account #: [id number] Procedure:                Upper GI endoscopy Indications:              Dyspepsia, Indigestion, Esophageal reflux,                            Esophageal reflux symptoms that persist despite                            appropriate therapy, Nausea Medicines:                Monitored Anesthesia Care Procedure:                Pre-Anesthesia Assessment:                           - Prior to the procedure, a History and Physical                            was performed, and patient medications and                            allergies were reviewed. The patient's tolerance of                            previous anesthesia was also reviewed. The risks                            and benefits of the procedure and the sedation                            options and risks were discussed with the patient.                            All questions were answered, and informed consent                            was obtained. Prior Anticoagulants: The patient has                            taken no previous anticoagulant or antiplatelet                            agents. ASA Grade Assessment: II - A patient with                            mild systemic disease. After reviewing the risks                            and benefits, the patient was deemed in  satisfactory condition to undergo the procedure.                           After obtaining informed consent, the endoscope was                            passed under direct vision. Throughout the                            procedure, the patient's blood pressure, pulse, and                            oxygen saturations were monitored continuously. The                            Endoscope was introduced  through the mouth, and                            advanced to the second part of duodenum. The upper                            GI endoscopy was accomplished without difficulty.                            The patient tolerated the procedure well. Scope In: Scope Out: Findings:                 The lumen of the lower third of the esophagus was                            mildly dilated with stasis of secretions.                           Esophagitis was found 25 to 38 cm from the                            incisors. Biopsies were taken with a cold forceps                            for histology.                           A 5 cm hiatal hernia was present.                           Patchy mild inflammation characterized by                            congestion (edema), erosions and erythema was found                            in the gastric body, in the gastric antrum and in  the prepyloric region of the stomach. Biopsies were                            taken with a cold forceps for Helicobacter pylori                            testing.                           The examined duodenum was normal. Complications:            No immediate complications. Estimated Blood Loss:     Estimated blood loss was minimal. Impression:               - Reflux and candidiasis esophagitis. Biopsied.                           - 5 cm hiatal hernia.                           - Gastritis. Biopsied.                           - Normal examined duodenum. Recommendation:           - Patient has a contact number available for                            emergencies. The signs and symptoms of potential                            delayed complications were discussed with the                            patient. Return to normal activities tomorrow.                            Written discharge instructions were provided to the                            patient.                           -  Resume previous diet.                           - Continue present medications.                           - Await pathology results.                           - Return to GI office at the next available                            appointment in 2-3 months, please call to schedule  appointment                           - Will consider esophageal manometry to further                            evaluate esophageal dysmotility, will discuss                            further at follow up office visit. Mauri Pole, MD 04/08/2020 9:33:27 AM This report has been signed electronically.

## 2020-04-08 NOTE — Progress Notes (Signed)
PT taken to PACU. Monitors in place. VSS. Report given to RN. 

## 2020-04-08 NOTE — Progress Notes (Signed)
Called to room to assist during endoscopic procedure.  Patient ID and intended procedure confirmed with present staff. Received instructions for my participation in the procedure from the performing physician.  

## 2020-04-08 NOTE — Progress Notes (Signed)
VS taken by C.W. 

## 2020-04-09 ENCOUNTER — Other Ambulatory Visit: Payer: Self-pay | Admitting: Family Medicine

## 2020-04-10 ENCOUNTER — Ambulatory Visit (INDEPENDENT_AMBULATORY_CARE_PROVIDER_SITE_OTHER): Payer: Medicare Other

## 2020-04-10 ENCOUNTER — Other Ambulatory Visit: Payer: Self-pay

## 2020-04-10 ENCOUNTER — Telehealth: Payer: Self-pay | Admitting: Gastroenterology

## 2020-04-10 ENCOUNTER — Telehealth: Payer: Self-pay

## 2020-04-10 VITALS — BP 126/74 | HR 70 | Temp 98.5°F | Wt 201.8 lb

## 2020-04-10 DIAGNOSIS — Z0000 Encounter for general adult medical examination without abnormal findings: Secondary | ICD-10-CM

## 2020-04-10 NOTE — Telephone Encounter (Signed)
Inbound call from patient requesting a call back please.  States Dr. Silverio Decamp found an infection while doing his procedure and she informed him she would be prescribing medication for it but can't remember if she said after receiving his results.  Please advise.

## 2020-04-10 NOTE — Patient Instructions (Addendum)
[name] , Thank you for taking time to come for your Medicare Wellness Visit. I appreciate your ongoing commitment to your health goals. Please review the following plan we discussed and let me know if I can assist you in the future.   Screening recommendations/referrals: Colonoscopy: Done 10/03/17 Recommended yearly ophthalmology/optometry visit for glaucoma screening and checkup Recommended yearly dental visit for hygiene and checkup  Vaccinations: Influenza vaccine: Done 01/14/20 Up to date Pneumococcal vaccine: Up to date Tdap vaccine: Up to date Shingles vaccine: Shingrix discussed. Please contact your pharmacy for coverage information.    Covid-19: Completed 1/23, 2/14, & 11/28/19  Advanced directives: Copies in the chart  Conditions/risks identified: Lose weight   Next appointment: Follow up in one year for your annual wellness visit.   Preventive Care 14 Years and Older, Male Preventive care refers to lifestyle choices and visits with your health care provider that can promote health and wellness. What does preventive care include?  A yearly physical exam. This is also called an annual well check.  Dental exams once or twice a year.  Routine eye exams. Ask your health care provider how often you should have your eyes checked.  Personal lifestyle choices, including:  Daily care of your teeth and gums.  Regular physical activity.  Eating a healthy diet.  Avoiding tobacco and drug use.  Limiting alcohol use.  Practicing safe sex.  Taking low doses of aspirin every day.  Taking vitamin and mineral supplements as recommended by your health care provider. What happens during an annual well check? The services and screenings done by your health care provider during your annual well check will depend on your age, overall health, lifestyle risk factors, and family history of disease. Counseling  Your health care provider may ask you questions about your:  Alcohol  use.  Tobacco use.  Drug use.  Emotional well-being.  Home and relationship well-being.  Sexual activity.  Eating habits.  History of falls.  Memory and ability to understand (cognition).  Work and work Statistician. Screening  You may have the following tests or measurements:  Height, weight, and BMI.  Blood pressure.  Lipid and cholesterol levels. These may be checked every 5 years, or more frequently if you are over 67 years old.  Skin check.  Lung cancer screening. You may have this screening every year starting at age 6 if you have a 30-pack-year history of smoking and currently smoke or have quit within the past 15 years.  Fecal occult blood test (FOBT) of the stool. You may have this test every year starting at age 59.  Flexible sigmoidoscopy or colonoscopy. You may have a sigmoidoscopy every 5 years or a colonoscopy every 10 years starting at age 77.  Prostate cancer screening. Recommendations will vary depending on your family history and other risks.  Hepatitis C blood test.  Hepatitis B blood test.  Sexually transmitted disease (STD) testing.  Diabetes screening. This is done by checking your blood sugar (glucose) after you have not eaten for a while (fasting). You may have this done every 1-3 years.  Abdominal aortic aneurysm (AAA) screening. You may need this if you are a current or former smoker.  Osteoporosis. You may be screened starting at age 54 if you are at high risk. Talk with your health care provider about your test results, treatment options, and if necessary, the need for more tests. Vaccines  Your health care provider may recommend certain vaccines, such as:  Influenza vaccine. This is  recommended every year.  Tetanus, diphtheria, and acellular pertussis (Tdap, Td) vaccine. You may need a Td booster every 10 years.  Zoster vaccine. You may need this after age 42.  Pneumococcal 13-valent conjugate (PCV13) vaccine. One dose is  recommended after age 42.  Pneumococcal polysaccharide (PPSV23) vaccine. One dose is recommended after age 57. Talk to your health care provider about which screenings and vaccines you need and how often you need them. This information is not intended to replace advice given to you by your health care provider. Make sure you discuss any questions you have with your health care provider. Document Released: 03/13/2015 Document Revised: 11/04/2015 Document Reviewed: 12/16/2014 Elsevier Interactive Patient Education  2017 [place] Prevention in the Home Falls can cause injuries. They can happen to people of all ages. There are many things you can do to make your home safe and to help prevent falls. What can I do on the outside of my home?  Regularly fix the edges of walkways and driveways and fix any cracks.  Remove anything that might make you trip as you walk through a door, such as a raised step or threshold.  Trim any bushes or trees on the path to your home.  Use bright outdoor lighting.  Clear any walking paths of anything that might make someone trip, such as rocks or tools.  Regularly check to see if handrails are loose or broken. Make sure that both sides of any steps have handrails.  Any raised decks and porches should have guardrails on the edges.  Have any leaves, snow, or ice cleared regularly.  Use sand or salt on walking paths during winter.  Clean up any spills in your garage right away. This includes oil or grease spills. What can I do in the bathroom?  Use night lights.  Install grab bars by the toilet and in the tub and shower. Do not use towel bars as grab bars.  Use non-skid mats or decals in the tub or shower.  If you need to sit down in the shower, use a plastic, non-slip stool.  Keep the floor dry. Clean up any water that spills on the floor as soon as it happens.  Remove soap buildup in the tub or shower regularly.  Attach bath mats  securely with double-sided non-slip rug tape.  Do not have throw rugs and other things on the floor that can make you trip. What can I do in the bedroom?  Use night lights.  Make sure that you have a light by your bed that is easy to reach.  Do not use any sheets or blankets that are too big for your bed. They should not hang down onto the floor.  Have a firm chair that has side arms. You can use this for support while you get dressed.  Do not have throw rugs and other things on the floor that can make you trip. What can I do in the kitchen?  Clean up any spills right away.  Avoid walking on wet floors.  Keep items that you use a lot in easy-to-reach places.  If you need to reach something above you, use a strong step stool that has a grab bar.  Keep electrical cords out of the way.  Do not use floor polish or wax that makes floors slippery. If you must use wax, use non-skid floor wax.  Do not have throw rugs and other things on the floor that can make you trip.  What can I do with my stairs?  Do not leave any items on the stairs.  Make sure that there are handrails on both sides of the stairs and use them. Fix handrails that are broken or loose. Make sure that handrails are as long as the stairways.  Check any carpeting to make sure that it is firmly attached to the stairs. Fix any carpet that is loose or worn.  Avoid having throw rugs at the top or bottom of the stairs. If you do have throw rugs, attach them to the floor with carpet tape.  Make sure that you have a light switch at the top of the stairs and the bottom of the stairs. If you do not have them, ask someone to add them for you. What else can I do to help prevent falls?  Wear shoes that:  Do not have high heels.  Have rubber bottoms.  Are comfortable and fit you well.  Are closed at the toe. Do not wear sandals.  If you use a stepladder:  Make sure that it is fully opened. Do not climb a closed  stepladder.  Make sure that both sides of the stepladder are locked into place.  Ask someone to hold it for you, if possible.  Clearly mark and make sure that you can see:  Any grab bars or handrails.  First and last steps.  Where the edge of each step is.  Use tools that help you move around (mobility aids) if they are needed. These include:  Canes.  Walkers.  Scooters.  Crutches.  Turn on the lights when you go into a dark area. Replace any light bulbs as soon as they burn out.  Set up your furniture so you have a clear path. Avoid moving your furniture around.  If any of your floors are uneven, fix them.  If there are any pets around you, be aware of where they are.  Review your medicines with your doctor. Some medicines can make you feel dizzy. This can increase your chance of falling. Ask your doctor what other things that you can do to help prevent falls. This information is not intended to replace advice given to you by your health care provider. Make sure you discuss any questions you have with your health care provider. Document Released: 12/11/2008 Document Revised: 07/23/2015 Document Reviewed: 03/21/2014 Elsevier Interactive Patient Education  2017 [employer].

## 2020-04-10 NOTE — Telephone Encounter (Signed)
Reviewed records. Patient had an endoscopy. Biopsy was taken. Results are pending. Patient was given AVS with instruction to call for an appointment for in 2 to 3 months. No mention of antibiotics. Called patient's house and cell phone. No answer. No designated party release on file to speak with anyone else.

## 2020-04-10 NOTE — Telephone Encounter (Signed)
  Follow up Call-  Call back number 04/08/2020 10/03/2017 08/21/2017  Post procedure Call Back phone  # [phone]  Permission to leave phone message Yes Yes Yes  Some recent data might be hidden     Patient questions:  Do you have a fever, pain , or abdominal swelling? No. Pain Score  0 *  Have you tolerated food without any problems? Yes.    Have you been able to return to your normal activities? Yes.    Do you have any questions about your discharge instructions: Diet   No. Medications  No. Follow up visit  No.  Do you have questions or concerns about your Care? No.  Actions: * If pain score is 4 or above: No action needed, pain <4. 1. Have you developed a fever since your procedure? no  2.   Have you had an respiratory symptoms (SOB or cough) since your procedure? no  3.   Have you tested positive for COVID 19 since your procedure no  4.   Have you had any family members/close contacts diagnosed with the COVID 19 since your procedure?  no   If yes to any of these questions please route to Joylene John, RN and Joella Prince, RN

## 2020-04-10 NOTE — Progress Notes (Signed)
Subjective:   Mark [name] is a 70 y.o. male who presents for Medicare Annual/Subsequent preventive examination.  Review of Systems      Cardiac Risk Factors include: advanced age (>72men, >48 women);hypertension;dyslipidemia;male gender     Objective:    Today's Vitals   04/10/20 0845 04/10/20 0849  BP: 126/74   Pulse: 70   Temp: 98.5 F (36.9 C)   SpO2: 93%   Weight: 201 lb 12.8 oz (91.5 kg)   PainSc:  5    Body mass index is 28.96 kg/m.  Advanced Directives 04/10/2020 03/11/2019 09/19/2017 08/21/2017 10/10/2013 10/09/2013 10/03/2013  Does Patient Have a Medical Advance Directive? Yes Yes Yes No No Patient would not like information;Patient does not have advance directive Patient would not like information;Patient does not have advance directive  Type of Advance Directive Healthcare Power of Attorney Living will;Healthcare Power of [place];Living will - - - -  Does patient want to make changes to medical advance directive? - No - Patient declined No - Patient declined - - - -  Copy of [place] in Chart? Yes - validated most recent copy scanned in chart (See row information) No - copy requested No - copy requested - - - -  Would patient like information on creating a medical advance directive? - - - - No - patient declined information - -  Pre-existing out of facility DNR order (yellow form or pink MOST form) - - - - - - -    Current Medications (verified) Outpatient Encounter Medications as of 04/10/2020  Medication Sig  . albuterol (PROAIR HFA) 108 (90 Base) MCG/ACT inhaler INHALE 2 PUFFS EVERY 6 HOURS AS NEEDED FOR WHEEZING OR SHORTNESS OF BREATH  . amLODipine (NORVASC) 2.5 MG tablet TAKE 1 TABLET BY MOUTH EVERY DAY  . atorvastatin (LIPITOR) 20 MG tablet Take 1 tablet (20 mg total) by mouth daily. Pt is taking at bedtime  . azelastine (ASTELIN) 0.1 % nasal spray PLACE 2 SPRAYS INTO EACH NOSTRIL DAILY  .  budesonide-formoterol (SYMBICORT) 160-4.5 MCG/ACT inhaler Inhale 2 puffs into the lungs 2 (two) times daily.  . butalbital-acetaminophen-caffeine (FIORICET) 50-325-40 MG tablet TAKE 1 TABLET BY MOUTH DAILY AS NEEDED. 1 refill per month.  . cyclobenzaprine (FLEXERIL) 10 MG tablet TAKE 0.5-1 TABLETS BY MOUTH 3 (THREE) TIMES DAILY AS NEEDED FOR MUSCLE SPASMS.  . fexofenadine (ALLEGRA) 180 MG tablet Patient is taking 2 times daily  . fluticasone (FLONASE) 50 MCG/ACT nasal spray USE ONE SPRAY IN EACH NOSTRIL AS DIRECTED ONCE DAILY  . gabapentin (NEURONTIN) 300 MG capsule Take 3 capsules (900 mg total) by mouth at bedtime.  . hydrochlorothiazide (HYDRODIURIL) 25 MG tablet TAKE 1 TABLET BY MOUTH TWICE A DAY  . montelukast (SINGULAIR) 10 MG tablet TAKE 1 TABLET BY MOUTH EVERYDAY AT BEDTIME  . omeprazole (PRILOSEC) 40 MG capsule TAKE 1 CAPSULE (40 MG TOTAL) BY MOUTH DAILY BEFORE A MEAL.  [name] Kitchen ondansetron (ZOFRAN) 4 MG tablet TAKE 1 TABLET BY MOUTH EVERY 8 HOURS AS NEEDED FOR NAUSEA AND VOMITING  . PARoxetine (PAXIL) 20 MG tablet TAKE 1 TABLET BY MOUTH EVERYDAY AT BEDTIME  . Polyethylene Glycol 3350 (MIRALAX PO) Take by mouth. Miralax 238 gm bowel prep-Take as directed  . promethazine (PHENERGAN) 12.5 MG tablet Take 1 tablet (12.5 mg total) by mouth daily. As needed  . propranolol (INDERAL) 40 MG tablet TAKE 1 TABLET BY MOUTH TWICE A DAY  . traMADol (ULTRAM) 50 MG tablet Take 1 tablet (  50 mg total) by mouth daily as needed.  . VESICARE 5 MG tablet Take 1 tablet by mouth daily.   No facility-administered encounter medications on file as of 04/10/2020.    Allergies (verified) Propoxyphene hcl   History: Past Medical History:  Diagnosis Date  . Allergy    seasonal  . Anxiety state, unspecified 09/01/2009  . ASTHMA 03/12/2007  . Asthma   . Cancer [facility]) 2008   prostate cancer  . Gastroparesis 05/01/2009  . GERD 04/02/2009  . Headache(784.0) 03/07/2007  . HIATAL HERNIA 04/28/2009  . Hiatal hernia   .  HYPERLIPIDEMIA 06/11/2007  . HYPERTENSION 03/07/2007  . Pneumonia    hx of  . Polyp of colon, hyperplastic   . PROSTATE CANCER, HX OF 03/07/2007  . Suspected exposure to asbestos    have asbestosis testing every year  . TRANSAMINASES, SERUM, ELEVATED 04/28/2009  . Tubular adenoma of colon   . Ulcer    as teenager   Past Surgical History:  Procedure Laterality Date  . APPENDECTOMY    . BACK SURGERY  05/2011  . COLONOSCOPY    . LUMBAR LAMINECTOMY/DECOMPRESSION MICRODISCECTOMY N/A 10/09/2013   Procedure: L4-5 DECOMPRESSION/FARAMINOTOMY REVISION ;  Surgeon: Melina Schools, MD;  Location: [place];  Service: Orthopedics;  Laterality: N/A;  . PROSTATECTOMY  07/10/06  . ROTATOR CUFF REPAIR     9/09 on right  . TRANSFORAMINAL LUMBAR INTERBODY FUSION (TLIF) WITH PEDICLE SCREW FIXATION 1 LEVEL N/A 10/09/2013   Procedure: TLIF L5-S1;  Surgeon: Melina Schools, MD;  Location: [place];  Service: Orthopedics;  Laterality: N/A;   Family History  Problem Relation Age of Onset  . Pancreatic cancer Mother 32  . Hypertension Father   . Heart attack Father        16  . Hypertension Brother        x 2  . Hypertension Brother   . Kidney disease Brother   . Hypertension Brother   . Multiple sclerosis Sister   . Heart disease Sister   . Heart attack Sister   . Hypertension Son   . Heart attack Maternal Grandmother   . Heart attack Paternal Grandmother   . Heart attack Paternal Grandfather   . Colon cancer Neg Hx   . Rectal cancer Neg Hx   . Stomach cancer Neg Hx   . Esophageal cancer Neg Hx    Social History   Socioeconomic History  . Marital status: Married    Spouse name: Not on file  . Number of children: 4  . Years of education: Not on file  . Highest education level: Not on file  Occupational History  . Occupation: Disabled  Tobacco Use  . Smoking status: Former Smoker    Packs/day: 0.50    Years: 2.00    Pack years: 1.00    Types: Cigarettes    Quit date: 04/09/1983    Years since  quitting: 37.0  . Smokeless tobacco: Never Used  Vaping Use  . Vaping Use: Never used  Substance and Sexual Activity  . Alcohol use: No    Alcohol/week: 0.0 standard drinks  . Drug use: No  . Sexual activity: Yes  Other Topics Concern  . Not on file  Social History Narrative   Married 1974. Wife-Helen. 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       Retired from  [employer]      Hobbies: hunting, boat (off for 1 year in 2015)   Social Determinants of Health   Financial Resource Strain: Low Risk   . Difficulty of Paying Living Expenses: Not hard at all  Food Insecurity: No Food Insecurity  . Worried About Charity fundraiser in the Last Year: Never true  . Ran Out of Food in the Last Year: Never true  Transportation Needs: No Transportation Needs  . Lack of Transportation (Medical): No  . Lack of Transportation (Non-Medical): No  Physical Activity: Inactive  . Days of Exercise per Week: 0 days  . Minutes of Exercise per Session: 0 min  Stress: No Stress Concern Present  . Feeling of Stress : Not at all  Social Connections: Moderately Integrated  . Frequency of Communication with Friends and Family: More than three times a week  . Frequency of Social Gatherings with Friends and Family: More than three times a week  . Attends Religious Services: 1 to 4 times per year  . Active Member of Clubs or Organizations: No  . Attends Archivist Meetings: Never  . Marital Status: Married    Tobacco Counseling Counseling given: Not Answered   Clinical Intake:  Pre-visit preparation completed: Yes  Pain : 0-10 Pain Score: 5  Pain Type: Chronic pain Pain Location: Back Pain Orientation: Lower Pain Descriptors / Indicators: Sharp Pain Onset: More than a month ago Pain Frequency: Intermittent     BMI - recorded:  28.96 Nutritional Status: BMI 25 -29 Overweight Nutritional Risks: Nausea/ vomitting/ diarrhea (ongoing nausea) Diabetes: No  How often do you need to have someone help you when you read instructions, pamphlets, or other written materials from your doctor or pharmacy?: 1 - Never  Diabetic?No  Interpreter Needed?: No  Information entered by :: Charlott Rakes, LPN   Activities of Daily Living In your present state of health, do you have any difficulty performing the following activities: 04/10/2020  Hearing? N  Vision? N  Difficulty concentrating or making decisions? N  Walking or climbing stairs? N  Dressing or bathing? N  Doing errands, shopping? N  Preparing Food and eating ? N  Using the Toilet? N  In the past six months, have you accidently leaked urine? N  Do you have problems with loss of bowel control? N  Managing your Medications? N  Managing your Finances? N  Housekeeping or managing your Housekeeping? N  Some recent data might be hidden    Patient Care Team: Marin Olp, MD as PCP - General (Family Medicine) Mauri Pole, MD as Consulting Physician (Gastroenterology) Raynelle Bring, MD as Consulting Physician (Urology)  Indicate any recent Medical Services you may have received from other than [facility] providers in the past year (date may be approximate).     Assessment:   This is a routine wellness examination for [place].  Hearing/Vision screen  Hearing Screening   125Hz  250Hz  500Hz  1000Hz  2000Hz  3000Hz  4000Hz  6000Hz  8000Hz   Right ear:           Left ear:           Comments: Denies any hearing issues  Vision Screening Comments: Pt follows up annually with [facility] for exams  Dietary issues and exercise activities discussed: Current Exercise Habits: Home exercise routine, Type of exercise: Other - see comments;walking (sparatic and incumbent bike)  Goals    . Increase physical activity     Patient currently swimming 2  days a week. Wants  to improve free style swimming    . Patient Stated     Lose weight     . Weight (lb) < 200 lb (90.7 kg)     Patient working to lose weight. Wants to lose 5-10 more pounds      Depression Screen PHQ 2/9 Scores 04/10/2020 10/22/2019 04/23/2019 03/11/2019 12/20/2017 09/19/2017 09/19/2017  PHQ - 2 Score 0 0 0 0 0 6 0  PHQ- 9 Score - 0 0 - 0 19 -    Fall Risk Fall Risk  04/10/2020 10/22/2019 03/11/2019 09/19/2017 09/08/2016  Falls in the past year? 0 0 0 No No  Number falls in past yr: 0 0 - - -  Injury with Fall? 0 0 0 - -  Risk for fall due to : Impaired vision - - - -  Follow up Falls prevention discussed - Falls evaluation completed;Education provided;Falls prevention discussed - -    FALL RISK PREVENTION PERTAINING TO THE HOME:  Any stairs in or around the home? Yes  If so, are there any without handrails? No  Home free of loose throw rugs in walkways, pet beds, electrical cords, etc? Yes  Adequate lighting in your home to reduce risk of falls? Yes   ASSISTIVE DEVICES UTILIZED TO PREVENT FALLS:  Life alert? No  Use of a cane, walker or w/c? Yes  Grab bars in the bathroom? No  Shower chair or bench in shower? Yes  Elevated toilet seat or a handicapped toilet? Yes   TIMED UP AND GO:  Was the test performed? Yes .  Length of time to ambulate 10 feet: 10 sec.   Gait steady and fast without use of assistive device  Cognitive Function:     6CIT Screen 04/10/2020 09/19/2017  What Year? 0 points 0 points  What month? 0 points 0 points  What time? - 0 points  Count back from 20 0 points 0 points  Months in reverse 0 points 0 points  Repeat phrase 4 points -    Immunizations Immunization History  Administered Date(s) Administered  . Fluad Quad(high Dose 65+) 10/22/2018, 01/14/2020  . H1N1 03/21/2008  . Influenza Split 12/31/2010, 11/22/2011, 11/28/2012  . Influenza Whole 12/12/2007, 12/11/2008  . Influenza, High Dose Seasonal PF 12/07/2016, 12/20/2017  . Influenza,inj,Quad  PF,6+ Mos 12/12/2014, 12/31/2015  . PFIZER(Purple Top)SARS-COV-2 Vaccination 03/23/2019, 04/14/2019, 11/28/2019  . Pneumococcal Conjugate-13 12/07/2016  . Pneumococcal Polysaccharide-23 12/20/2017  . Td 03/01/1995, 06/17/2008  . Tdap 06/11/2007, 08/27/2018  . Zoster 01/10/2012    TDAP status: Up to date  Flu Vaccine status: Up to date  Done 01/14/20 Pneumococcal vaccine status: Up to date  Covid-19 vaccine status: Completed vaccines  Qualifies for Shingles Vaccine? Yes   Zostavax completed Yes   Shingrix Completed?: No.    Education has been provided regarding the importance of this vaccine. Patient has been advised to call insurance company to determine out of pocket expense if they have not yet received this vaccine. Advised may also receive vaccine at local pharmacy or Health Dept. Verbalized acceptance and understanding.  Screening Tests Health Maintenance  Topic Date Due  . COVID-19 Vaccine (4 - Booster for Pfizer series) 05/27/2020  . COLONOSCOPY (Pts 45-14yrs Insurance coverage will need to be confirmed)  10/03/2020  . TETANUS/TDAP  08/26/2028  . INFLUENZA VACCINE  Completed  . Hepatitis C Screening  Completed  . PNA vac Low Risk Adult  Completed    Health Maintenance  There are no preventive  care reminders to display for this patient.  Colorectal cancer screening: Type of screening: Colonoscopy. Completed 10/03/17. Repeat every 3 years   Additional Screening:  Hepatitis C Screening:  Completed 03/24/09  Vision Screening: Recommended annual ophthalmology exams for early detection of glaucoma and other disorders of the eye. Is the patient up to date with their annual eye exam?  Yes  Who is the provider or what is the name of the office in which the patient attends annual eye exams? [facility] If pt is not established with a provider, would they like to be referred to a provider to establish care? No .   Dental Screening: Recommended annual dental exams for proper  oral hygiene  Community Resource Referral / Chronic Care Management: CRR required this visit?  No   CCM required this visit?  No      Plan:     I have personally reviewed and noted the following in the patient's chart:   . Medical and social history . Use of alcohol, tobacco or illicit drugs  . Current medications and supplements . Functional ability and status . Nutritional status . Physical activity . Advanced directives . List of other physicians . Hospitalizations, surgeries, and ER visits in previous 12 months . Vitals . Screenings to include cognitive, depression, and falls . Referrals and appointments  In addition, I have reviewed and discussed with patient certain preventive protocols, quality metrics, and best practice recommendations. A written personalized care plan for preventive services as well as general preventive health recommendations were provided to patient.     Willette Brace, LPN   1/66/0630   Nurse Notes: None

## 2020-04-14 ENCOUNTER — Encounter: Payer: Self-pay | Admitting: Gastroenterology

## 2020-04-14 ENCOUNTER — Other Ambulatory Visit: Payer: Self-pay

## 2020-04-14 MED ORDER — FLUCONAZOLE 100 MG PO TABS: 100.0000 mg | ORAL_TABLET | Freq: Every day | ORAL | 0 refills | Status: AC

## 2020-04-14 NOTE — Telephone Encounter (Signed)
Pt is returning a missed call from the nurse. 

## 2020-04-17 ENCOUNTER — Other Ambulatory Visit: Payer: Self-pay

## 2020-04-17 MED ORDER — TRAMADOL HCL 50 MG PO TABS: 50.0000 mg | ORAL_TABLET | Freq: Every day | ORAL | 2 refills | Status: DC | PRN

## 2020-04-17 NOTE — Telephone Encounter (Signed)
  LAST APPOINTMENT DATE: 10/22/2019   NEXT APPOINTMENT DATE:@2 /22/2022  MEDICATION:traMADol (ULTRAM) 50 MG tablet  PHARMACY:[id number] - [place], [place] [address]   Please advise

## 2020-04-17 NOTE — Telephone Encounter (Signed)
Tramadol last rx 03/27/19 #30 2 RF

## 2020-04-20 NOTE — Patient Instructions (Addendum)
No changes today   We will fill out prior auth when we receive- may want to reach back out to pharmacy and have them resend

## 2020-04-20 NOTE — Progress Notes (Signed)
Phone [phone] In person visit   Subjective:   Mark [name] is a 70 y.o. year old very pleasant male patient who presents for/with See problem oriented charting Chief Complaint  Patient presents with   Hypertension   Hyperlipidemia   Anxiety   Gastroesophageal Reflux    This visit occurred during the SARS-CoV-2 public health emergency.  Safety protocols were in place, including screening questions prior to the visit, additional usage of staff PPE, and extensive cleaning of exam room while observing appropriate contact time as indicated for disinfecting solutions.   Past Medical History-  Patient Active Problem List   Diagnosis Date Noted   Asbestosis ([place]) 02/05/2016    Priority: High   Back pain 10/09/2013    Priority: High   Anxiety state 09/01/2009    Priority: High   Migraine headache 03/07/2007    Priority: High   Gastroparesis 05/01/2009    Priority: Medium   GERD 04/02/2009    Priority: Medium   Hyperlipidemia 06/11/2007    Priority: Medium   Asthma 03/12/2007    Priority: Medium   Essential hypertension 03/07/2007    Priority: Medium   PROSTATE CANCER, HX OF 03/07/2007    Priority: Medium   Allergic rhinitis 11/22/2013    Priority: Low   Overweight 01/10/2012    Priority: Low   HIATAL HERNIA 04/28/2009    Priority: Low   History of adenomatous polyp of colon 09/08/2017    Medications- reviewed and updated Current Outpatient Medications  Medication Sig Dispense Refill   albuterol (PROAIR HFA) 108 (90 Base) MCG/ACT inhaler INHALE 2 PUFFS EVERY 6 HOURS AS NEEDED FOR WHEEZING OR SHORTNESS OF BREATH 76.5 g 3   amLODipine (NORVASC) 2.5 MG tablet TAKE 1 TABLET BY MOUTH EVERY DAY 90 tablet 1   atorvastatin (LIPITOR) 20 MG tablet Take 1 tablet (20 mg total) by mouth daily. Pt is taking at bedtime 90 tablet 3   azelastine (ASTELIN) 0.1 % nasal spray PLACE 2 SPRAYS INTO EACH NOSTRIL DAILY 30 mL 3   budesonide-formoterol  (SYMBICORT) 160-4.5 MCG/ACT inhaler Inhale 2 puffs into the lungs 2 (two) times daily. 1 each 11   butalbital-acetaminophen-caffeine (FIORICET) 50-325-40 MG tablet TAKE 1 TABLET BY MOUTH DAILY AS NEEDED. 1 refill per month. 10 tablet 5   cyclobenzaprine (FLEXERIL) 10 MG tablet TAKE 0.5-1 TABLETS BY MOUTH 3 (THREE) TIMES DAILY AS NEEDED FOR MUSCLE SPASMS. 30 tablet 5   fexofenadine (ALLEGRA) 180 MG tablet Patient is taking 2 times daily 90 tablet 1   fluconazole (DIFLUCAN) 100 MG tablet Take 1 tablet (100 mg total) by mouth daily for 14 days. 14 tablet 0   fluticasone (FLONASE) 50 MCG/ACT nasal spray USE ONE SPRAY IN EACH NOSTRIL AS DIRECTED ONCE DAILY 48 mL 3   gabapentin (NEURONTIN) 300 MG capsule Take 3 capsules (900 mg total) by mouth at bedtime. 30 capsule 1   hydrochlorothiazide (HYDRODIURIL) 25 MG tablet TAKE 1 TABLET BY MOUTH TWICE A DAY 180 tablet 1   montelukast (SINGULAIR) 10 MG tablet TAKE 1 TABLET BY MOUTH EVERYDAY AT BEDTIME 90 tablet 3   omeprazole (PRILOSEC) 40 MG capsule TAKE 1 CAPSULE (40 MG TOTAL) BY MOUTH DAILY BEFORE A MEAL. 90 capsule 3   ondansetron (ZOFRAN) 4 MG tablet TAKE 1 TABLET BY MOUTH EVERY 8 HOURS AS NEEDED FOR NAUSEA AND VOMITING 30 tablet 3   PARoxetine (PAXIL) 20 MG tablet TAKE 1 TABLET BY MOUTH EVERYDAY AT BEDTIME 90 tablet 0   Polyethylene Glycol 3350 (MIRALAX PO)  Take by mouth. Miralax 238 gm bowel prep-Take as directed     promethazine (PHENERGAN) 12.5 MG tablet Take 1 tablet (12.5 mg total) by mouth daily. As needed 30 tablet 3   propranolol (INDERAL) 40 MG tablet TAKE 1 TABLET BY MOUTH TWICE A DAY 180 tablet 2   traMADol (ULTRAM) 50 MG tablet Take 1 tablet (50 mg total) by mouth daily as needed. 30 tablet 2   VESICARE 5 MG tablet Take 1 tablet by mouth daily.     No current facility-administered medications for this visit.     Objective:  BP 137/81    Pulse 61    Temp 98 F (36.7 C) (Temporal)    Ht 5\' 10"  (1.778 m)    Wt 201 lb 3.2 oz  (91.3 kg)    SpO2 98%    BMI 28.87 kg/m  Gen: NAD, resting comfortably CV: RRR no murmurs rubs or gallops Lungs: CTAB no crackles, wheeze, rhonchi Abdomen: soft/nontender/nondistended/normal bowel sounds.  Ext: no edema Skin: warm, dry    Assessment and Plan   #Asbestosis class II-follows with Dr. Charlett Blake #Asthma S: Medication: Symbicort for maintenance added August 2021 in addition to Singulair 10 mg, albuterol as needed 3-4x a week (states still taking out of habit- down from 10x a week) A/P: improved control- may be overtaking albuterol out of habit- asked him to listen to his body and take only if feels wheezy or short of breath   #History of prostate cancer-robotic prostatectomy with Dr. Alinda Money in the past.  Continues to follow with urology- July 2021 visit  -For overactive bladder patient is on Vesicare  #hypertension S: medication: Propanolol 40mg  twice daily, hydrochlorothiazide 25 mg amlodipine 2.5Mg  A/P: Stable. Continue current medications.   #hyperlipidemia-LDL has been below 70 S: Medication:Atorvastatin 20Mg   A/P: Stable. Continue current medications.  Last LDL under 70  # Anxiety S:Medication: Paxil 20 mg- feels reasonably controlled Suicidal ideation:none  A/P: Stable. Continue current medications.    #Chronic back pain-in the past patient was on Percocet but later weaned to Mobic and Valium and gabapentin and tramadol -Currently on Flexeril 10 mg up to 3 times a day as needed, gabapentin 300 mg at bedtime, tramadol daily as needed- unfortunately he has only been able to get 7 at a time and states needs prior authorization (states pharmacy has sent but we have not seen yet so he will check again). Pain has worsened since being off tramadol  #Migraines S: Prophylaxis: Propranolol for blood pressure control but also likely helps reduce frequency of migraines. -Fioricet uses typically twice a week-lately about the same  A/P: Stable. Continue current  medications.    # GERD S:Medication: Prilosec 40Mg  daily.  Rare poor control intake second dose- ended up with endoscopy as below  B12 levels related to PPI use: Low normal-he takes a B12 supplement -Endoscopy 04/08/2020 showing esophageal candidiasis A/P: I wonder if symbicort contributed to issues- encouraged thorough rinse out after using (hasnt always been doing). Stay on prilosec- improving control    #Allergies-Astelin helpful along with Singulair 10 mg  Recommended follow up: Return in about 6 months (around 10/19/2020) for follow up- or sooner if needed. Future Appointments  Date Time Provider [place]  05/21/2020 11:00 AM Mauri Pole, MD [facility]  04/16/2021  8:45 AM [facility]   Lab/Order associations:   ICD-10-CM   1. Essential hypertension  I10   2. Gastroesophageal reflux disease with esophagitis without hemorrhage  K21.00  3. Hyperlipidemia, unspecified hyperlipidemia type  E78.5   4. Anxiety state  F41.1   5. Asbestosis ([place]) Chronic J61    Return precautions advised.  Garret Reddish, MD

## 2020-04-21 ENCOUNTER — Encounter: Payer: Self-pay | Admitting: Family Medicine

## 2020-04-21 ENCOUNTER — Ambulatory Visit (INDEPENDENT_AMBULATORY_CARE_PROVIDER_SITE_OTHER): Payer: Medicare Other | Admitting: Family Medicine

## 2020-04-21 ENCOUNTER — Other Ambulatory Visit: Payer: Self-pay

## 2020-04-21 VITALS — BP 137/81 | HR 61 | Temp 98.0°F | Ht 70.0 in | Wt 201.2 lb

## 2020-04-21 DIAGNOSIS — E785 Hyperlipidemia, unspecified: Secondary | ICD-10-CM

## 2020-04-21 DIAGNOSIS — J61 Pneumoconiosis due to asbestos and other mineral fibers: Secondary | ICD-10-CM | POA: Diagnosis not present

## 2020-04-21 DIAGNOSIS — F411 Generalized anxiety disorder: Secondary | ICD-10-CM

## 2020-04-21 DIAGNOSIS — K2100 Gastro-esophageal reflux disease with esophagitis, without bleeding: Secondary | ICD-10-CM | POA: Diagnosis not present

## 2020-04-21 DIAGNOSIS — I10 Essential (primary) hypertension: Secondary | ICD-10-CM

## 2020-05-12 DIAGNOSIS — H2513 Age-related nuclear cataract, bilateral: Secondary | ICD-10-CM | POA: Diagnosis not present

## 2020-05-21 ENCOUNTER — Telehealth: Payer: Self-pay

## 2020-05-21 ENCOUNTER — Telehealth: Payer: Self-pay | Admitting: Gastroenterology

## 2020-05-21 ENCOUNTER — Ambulatory Visit: Payer: Medicare Other | Admitting: Gastroenterology

## 2020-05-21 NOTE — Telephone Encounter (Signed)
Patient called back, rescheduled his office visit for 05/27/20

## 2020-05-21 NOTE — Telephone Encounter (Signed)
Left message for patient to please call back. 

## 2020-05-27 ENCOUNTER — Ambulatory Visit (INDEPENDENT_AMBULATORY_CARE_PROVIDER_SITE_OTHER): Payer: Medicare Other | Admitting: Gastroenterology

## 2020-05-27 ENCOUNTER — Encounter: Payer: Self-pay | Admitting: Gastroenterology

## 2020-05-27 VITALS — BP 160/86 | HR 85 | Ht 70.0 in | Wt 206.0 lb

## 2020-05-27 DIAGNOSIS — B3781 Candidal esophagitis: Secondary | ICD-10-CM

## 2020-05-27 DIAGNOSIS — R110 Nausea: Secondary | ICD-10-CM | POA: Diagnosis not present

## 2020-05-27 DIAGNOSIS — R1310 Dysphagia, unspecified: Secondary | ICD-10-CM

## 2020-05-27 DIAGNOSIS — K219 Gastro-esophageal reflux disease without esophagitis: Secondary | ICD-10-CM | POA: Diagnosis not present

## 2020-05-27 MED ORDER — FLUCONAZOLE 100 MG PO TABS
100.0000 mg | ORAL_TABLET | Freq: Every day | ORAL | 0 refills | Status: DC
Start: 2020-05-27 — End: 2020-10-21

## 2020-05-27 NOTE — Patient Instructions (Signed)
You have been scheduled for an endoscopy. Please follow written instructions given to you at your visit today. If you use inhalers (even only as needed), please bring them with you on the day of your procedure.   If you are age 69 or older, your body mass index should be between 23-30. Your Body mass index is 29.56 kg/m. If this is out of the aforementioned range listed, please consider follow up with your Primary Care Provider.  If you are age 71 or younger, your body mass index should be between 19-25. Your Body mass index is 29.56 kg/m. If this is out of the aformentioned range listed, please consider follow up with your Primary Care Provider.    We have sent Diflucan to your pharmacy  Follow up after Endoscopy   Due to recent changes in healthcare laws, you may see the results of your imaging and laboratory studies on MyChart before your provider has had a chance to review them.  We understand that in some cases there may be results that are confusing or concerning to you. Not all laboratory results come back in the same time frame and the provider may be waiting for multiple results in order to interpret others.  Please give [place] 48 hours in order for your provider to thoroughly review all the results before contacting the office for clarification of your results.   I appreciate the  opportunity to care for you  Thank You   Harl Bowie , MD

## 2020-05-27 NOTE — Telephone Encounter (Signed)
Patient was seen today by Dr. Silverio Decamp

## 2020-05-27 NOTE — Progress Notes (Signed)
Mark [name]    [id number]    08/04/1950  Primary Care Physician:[name], [name], MD  Referring Physician: Marin Olp, MD [place],  [place] [address]   Chief complaint:  Nausea, dysphagia  HPI:  70 year old very pleasant gentleman here for follow-up visit for dysphagia, GERD and chronic nausea  He had Candida esophagitis and esophageal stasis on last EGD, was treated with fluconazole.  He noticed improvement of symptoms after taking fluconazole but he feels his symptoms have recurred since.  He has some discomfort while swallowing, has recurrent nausea and also he feels the food gets hung up in his throat intermittently. Denies unintentional weight loss or decreased appetite.  No melena or blood per rectum.  EGD 04/08/20 - The lumen of the lower third of the esophagus was mildly dilated with stasis of secretions. - Esophagitis was found 25 to 38 cm from the incisors. Biopsies were taken with a cold forceps for histology, showed evidence of Candida esophagitis - A 5 cm hiatal hernia was present. - Patchy mild inflammation characterized by congestion (edema), erosions and erythema was found in the gastric body, in the gastric antrum and in the prepyloric region of the stomach. Biopsies were taken with a cold forceps for Helicobacter pylori testing. - The examined duodenum was normal.  4 hours gastric emptying scan 2018: Normal HIDA scan: Normal 65% gallbladder ejection fraction EGD February 2011:Mild gastritis and esophagitis otherwise normal exam Colonoscopy 2014:Removal of tubular adenoma Colonoscopy August 2019 with removal of 6 sessile polyps [tubular adenoma and sessile serrated adenomas], recall colonoscopy in 3 years.  Outpatient Encounter Medications as of 05/27/2020  Medication Sig  . albuterol (PROAIR HFA) 108 (90 Base) MCG/ACT inhaler INHALE 2 PUFFS EVERY 6 HOURS AS NEEDED FOR WHEEZING OR SHORTNESS OF BREATH  . amLODipine  (NORVASC) 2.5 MG tablet TAKE 1 TABLET BY MOUTH EVERY DAY  . atorvastatin (LIPITOR) 20 MG tablet Take 1 tablet (20 mg total) by mouth daily. Pt is taking at bedtime  . azelastine (ASTELIN) 0.1 % nasal spray PLACE 2 SPRAYS INTO EACH NOSTRIL DAILY  . budesonide-formoterol (SYMBICORT) 160-4.5 MCG/ACT inhaler Inhale 2 puffs into the lungs 2 (two) times daily.  . butalbital-acetaminophen-caffeine (FIORICET) 50-325-40 MG tablet TAKE 1 TABLET BY MOUTH DAILY AS NEEDED. 1 refill per month.  . cyclobenzaprine (FLEXERIL) 10 MG tablet TAKE 0.5-1 TABLETS BY MOUTH 3 (THREE) TIMES DAILY AS NEEDED FOR MUSCLE SPASMS.  . fexofenadine (ALLEGRA) 180 MG tablet Patient is taking 2 times daily  . fluticasone (FLONASE) 50 MCG/ACT nasal spray USE ONE SPRAY IN EACH NOSTRIL AS DIRECTED ONCE DAILY  . gabapentin (NEURONTIN) 300 MG capsule Take 3 capsules (900 mg total) by mouth at bedtime.  . hydrochlorothiazide (HYDRODIURIL) 25 MG tablet TAKE 1 TABLET BY MOUTH TWICE A DAY  . montelukast (SINGULAIR) 10 MG tablet TAKE 1 TABLET BY MOUTH EVERYDAY AT BEDTIME  . omeprazole (PRILOSEC) 40 MG capsule TAKE 1 CAPSULE (40 MG TOTAL) BY MOUTH DAILY BEFORE A MEAL.  [name] Kitchen ondansetron (ZOFRAN) 4 MG tablet TAKE 1 TABLET BY MOUTH EVERY 8 HOURS AS NEEDED FOR NAUSEA AND VOMITING  . PARoxetine (PAXIL) 20 MG tablet TAKE 1 TABLET BY MOUTH EVERYDAY AT BEDTIME  . Polyethylene Glycol 3350 (MIRALAX PO) Take by mouth. Miralax 238 gm bowel prep-Take as directed  . promethazine (PHENERGAN) 12.5 MG tablet Take 1 tablet (12.5 mg total) by mouth daily. As needed  . propranolol (INDERAL) 40 MG tablet  TAKE 1 TABLET BY MOUTH TWICE A DAY  . traMADol (ULTRAM) 50 MG tablet Take 1 tablet (50 mg total) by mouth daily as needed.  . VESICARE 5 MG tablet Take 1 tablet by mouth daily.   No facility-administered encounter medications on file as of 05/27/2020.    Allergies as of 05/27/2020 - Review Complete 05/27/2020  Allergen Reaction Noted  . Propoxyphene hcl Nausea  And Vomiting 03/27/2006    Past Medical History:  Diagnosis Date  . Allergy    seasonal  . Anxiety state, unspecified 09/01/2009  . ASTHMA 03/12/2007  . Asthma   . [facility]) 2008   prostate cancer  . Gastroparesis 05/01/2009  . GERD 04/02/2009  . Headache(784.0) 03/07/2007  . HIATAL HERNIA 04/28/2009  . Hiatal hernia   . HYPERLIPIDEMIA 06/11/2007  . HYPERTENSION 03/07/2007  . Pneumonia    hx of  . Polyp of colon, hyperplastic   . PROSTATE CANCER, HX OF 03/07/2007  . Suspected exposure to asbestos    have asbestosis testing every year  . TRANSAMINASES, SERUM, ELEVATED 04/28/2009  . Tubular adenoma of colon   . Ulcer    as teenager    Past Surgical History:  Procedure Laterality Date  . APPENDECTOMY    . BACK SURGERY  05/2011  . COLONOSCOPY    . LUMBAR LAMINECTOMY/DECOMPRESSION MICRODISCECTOMY N/A 10/09/2013   Procedure: L4-5 DECOMPRESSION/FARAMINOTOMY REVISION ;  Surgeon: Melina Schools, MD;  Location: [facility];  Service: Orthopedics;  Laterality: N/A;  . PROSTATECTOMY  07/10/06  . ROTATOR CUFF REPAIR     9/09 on right  . TRANSFORAMINAL LUMBAR INTERBODY FUSION (TLIF) WITH PEDICLE SCREW FIXATION 1 LEVEL N/A 10/09/2013   Procedure: TLIF L5-S1;  Surgeon: Melina Schools, MD;  Location: [facility];  Service: Orthopedics;  Laterality: N/A;    Family History  Problem Relation Age of Onset  . Pancreatic cancer Mother 20  . Hypertension Father   . Heart attack Father        77  . Hypertension Brother        x 2  . Hypertension Brother   . Kidney disease Brother   . Hypertension Brother   . Multiple sclerosis Sister   . Heart disease Sister   . Heart attack Sister   . Hypertension Son   . Heart attack Maternal Grandmother   . Heart attack Paternal Grandmother   . Heart attack Paternal Grandfather   . Colon cancer Neg Hx   . Rectal cancer Neg Hx   . Stomach cancer Neg Hx   . Esophageal cancer Neg Hx     Social History   Socioeconomic History  . Marital status: Married    Spouse  name: Not on file  . Number of children: 4  . Years of education: Not on file  . Highest education level: Not on file  Occupational History  . Occupation: Disabled  Tobacco Use  . Smoking status: Former Smoker    Packs/day: 0.50    Years: 2.00    Pack years: 1.00    Types: Cigarettes    Quit date: 04/09/1983    Years since quitting: 37.1  . Smokeless tobacco: Never Used  Vaping Use  . Vaping Use: Never used  Substance and Sexual Activity  . Alcohol use: No    Alcohol/week: 0.0 standard drinks  . Drug use: No  . Sexual activity: Yes  Other Topics Concern  . Not on file  Social History Narrative   Married 1974. Wife-Helen. 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       Retired from [employer]: hunting, boat (off for 1 year in 2015)   Social Determinants of Health   Financial Resource Strain: Low Risk   . Difficulty of Paying Living Expenses: Not hard at all  Food Insecurity: No Food Insecurity  . Worried About Charity fundraiser in the Last Year: Never true  . Ran Out of Food in the Last Year: Never true  Transportation Needs: No Transportation Needs  . Lack of Transportation (Medical): No  . Lack of Transportation (Non-Medical): No  Physical Activity: Inactive  . Days of Exercise per Week: 0 days  . Minutes of Exercise per Session: 0 min  Stress: No Stress Concern Present  . Feeling of Stress : Not at all  Social Connections: Moderately Integrated  . Frequency of Communication with Friends and Family: More than three times a week  . Frequency of Social Gatherings with Friends and Family: More than three times a week  . Attends Religious Services: 1 to 4 times per year  . Active Member of Clubs or Organizations: No  . Attends Archivist Meetings: Never  . Marital Status: Married  Human resources officer  Violence: Not At Risk  . Fear of Current or Ex-Partner: No  . Emotionally Abused: No  . Physically Abused: No  . Sexually Abused: No      Review of systems: All other review of systems negative except as mentioned in the HPI.   Physical Exam: Vitals:   05/27/20 0856  BP: (!) 160/86  Pulse: 85   Body mass index is 29.56 kg/m. Gen:      No acute distress Neuro: alert and oriented x 3 Psych: normal mood and affect  Data Reviewed:  Reviewed labs, radiology imaging, old records and pertinent past GI work up   Assessment and Plan/Recommendations:  70 year old very pleasant gentleman with history of hypertension, hyperlipidemia, anxiety disorder, hiatal hernia and chronic GERD  GERD and hiatal hernia: Continue omeprazole daily and antireflux measures  Dysphagia and odynophagia: Could be secondary to recurrent esophageal candidiasis We will plan to treat with a longer course of oral fluconazole, Rx for 100 mg daily oral fluconazole for 21 days We will schedule for EGD to document healing of erosive esophagitis and esophageal dilation as needed The risks and benefits as well as alternatives of endoscopic procedure(s) have been discussed and reviewed. All questions answered. The patient agrees to proceed.  Based on EGD findings, may need to consider esophageal manometry to exclude any major esophageal dysmotility.  Will schedule it after EGD if needed.  History of advanced adenomatous colon polyps: Due for surveillance colonoscopy in August 2022   The patient was provided an opportunity to ask questions and all were answered. The patient agreed with the plan and demonstrated an understanding of the instructions.  Damaris Hippo , MD    CC: Marin Olp, MD

## 2020-06-02 ENCOUNTER — Ambulatory Visit: Payer: Medicare Other | Admitting: Gastroenterology

## 2020-06-29 ENCOUNTER — Encounter: Payer: Self-pay | Admitting: Gastroenterology

## 2020-06-29 ENCOUNTER — Ambulatory Visit: Payer: Medicare Other | Admitting: Gastroenterology

## 2020-06-29 ENCOUNTER — Other Ambulatory Visit: Payer: Self-pay

## 2020-06-29 VITALS — BP 100/65 | HR 60 | Temp 97.5°F | Resp 13 | Ht 70.0 in | Wt 206.0 lb

## 2020-06-29 DIAGNOSIS — R110 Nausea: Secondary | ICD-10-CM

## 2020-06-29 DIAGNOSIS — R1310 Dysphagia, unspecified: Secondary | ICD-10-CM | POA: Diagnosis not present

## 2020-06-29 DIAGNOSIS — R1319 Other dysphagia: Secondary | ICD-10-CM

## 2020-06-29 DIAGNOSIS — J45909 Unspecified asthma, uncomplicated: Secondary | ICD-10-CM | POA: Diagnosis not present

## 2020-06-29 DIAGNOSIS — K219 Gastro-esophageal reflux disease without esophagitis: Secondary | ICD-10-CM | POA: Diagnosis not present

## 2020-06-29 MED ORDER — SODIUM CHLORIDE 0.9 % IV SOLN: 500.0000 mL | Freq: Once | INTRAVENOUS | Status: DC

## 2020-06-29 MED ORDER — OMEPRAZOLE 40 MG PO CPDR: 40.0000 mg | DELAYED_RELEASE_CAPSULE | Freq: Two times a day (BID) | ORAL | 3 refills | Status: DC

## 2020-06-29 NOTE — Progress Notes (Signed)
VS by CW  Pt's states no medical or surgical changes since previsit or office visit.  

## 2020-06-29 NOTE — Op Note (Signed)
[facility] Patient Name: Mark [name] Procedure Date: 06/29/2020 3:31 PM MRN: [id number] Endoscopist: Mauri Pole , MD Age: 70 Referring MD:  Date of Birth: 04-14-1950 Gender: Male Account #: [id number] Procedure:                Upper GI endoscopy Indications:              Dysphagia, Odynophagia Medicines:                Monitored Anesthesia Care Procedure:                Pre-Anesthesia Assessment:                           - Prior to the procedure, a History and Physical                            was performed, and patient medications and                            allergies were reviewed. The patient's tolerance of                            previous anesthesia was also reviewed. The risks                            and benefits of the procedure and the sedation                            options and risks were discussed with the patient.                            All questions were answered, and informed consent                            was obtained. Prior Anticoagulants: The patient has                            taken no previous anticoagulant or antiplatelet                            agents. ASA Grade Assessment: II - A patient with                            mild systemic disease. After reviewing the risks                            and benefits, the patient was deemed in                            satisfactory condition to undergo the procedure.                           After obtaining informed consent, the endoscope was  passed under direct vision. Throughout the                            procedure, the patient's blood pressure, pulse, and                            oxygen saturations were monitored continuously. The                            Endoscope was introduced through the mouth, and                            advanced to the second part of duodenum. The upper                            GI endoscopy was  accomplished without difficulty.                            The patient tolerated the procedure well. Scope In: Scope Out: Findings:                 LA Grade B (one or more mucosal breaks greater than                            5 mm, not extending between the tops of two mucosal                            folds) esophagitis with no bleeding was found 36 to                            37 cm from the incisors.                           A 3 cm hiatal hernia was present.                           One benign-appearing, intrinsic mild stenosis was                            found 36 to 37 cm from the incisors. This stenosis                            measured 1.8 cm (inner diameter) x less than one cm                            (in length). The stenosis was traversed. A TTS                            dilator was passed through the scope. Dilation with                            an 18-19-20 mm balloon dilator was performed to 20  mm. The dilation site was examined following                            endoscope reinsertion and showed no change.                           The stomach was normal.                           The examined duodenum was normal. Complications:            No immediate complications. Estimated Blood Loss:     Estimated blood loss was minimal. Impression:               - LA Grade B reflux esophagitis with no bleeding.                           - 3 cm hiatal hernia.                           - Benign-appearing esophageal stenosis. Dilated.                           - Normal stomach.                           - Normal examined duodenum.                           - No specimens collected. Recommendation:           - Resume previous diet.                           - Continue present medications.                           - Follow an antireflux regimen.                           - Increase to Omeprazole 40mg  BID X 3 months and                             then decrease to once daily                           - Follow up in GI office in 3 months. If continues                            to have persistent dysphagia, will consider                            esophageal manometry for further evaluation Mauri Pole, MD 06/29/2020 4:16:26 PM This report has been signed electronically.

## 2020-06-29 NOTE — Patient Instructions (Signed)
Handout given:  Antireflux  Resume previous diet Continue current medications:    ** Increase omeprazole 40mg  twice daily for 3 months then decrease to once daily Please follow an anti reflux regimen  YOU HAD AN ENDOSCOPIC PROCEDURE TODAY AT [facility]:   Refer to the procedure report that was given to you for any specific questions about what was found during the examination.  If the procedure report does not answer your questions, please call your gastroenterologist to clarify.  If you requested that your care partner not be given the details of your procedure findings, then the procedure report has been included in a sealed envelope for you to review at your convenience later.  YOU SHOULD EXPECT: Some feelings of bloating in the abdomen. Passage of more gas than usual.  Walking can help get rid of the air that was put into your GI tract during the procedure and reduce the bloating. If you had a lower endoscopy (such as a colonoscopy or flexible sigmoidoscopy) you may notice spotting of blood in your stool or on the toilet paper. If you underwent a bowel prep for your procedure, you may not have a normal bowel movement for a few days.  Please Note:  You might notice some irritation and congestion in your nose or some drainage.  This is from the oxygen used during your procedure.  There is no need for concern and it should clear up in a day or so.  SYMPTOMS TO REPORT IMMEDIATELY:   Following upper endoscopy (EGD)  Vomiting of blood or coffee ground material  New chest pain or pain under the shoulder blades  Painful or persistently difficult swallowing  New shortness of breath  Fever of 100F or higher  Black, tarry-looking stools  For urgent or emergent issues, a gastroenterologist can be reached at any hour by calling [phone]. Do not use MyChart messaging for urgent concerns.   DIET:  We do recommend a small meal at first, but then you may proceed to your regular  diet.  Drink plenty of fluids but you should avoid alcoholic beverages for 24 hours.  ACTIVITY:  You should plan to take it easy for the rest of today and you should NOT DRIVE or use heavy machinery until tomorrow (because of the sedation medicines used during the test).    FOLLOW UP: Our staff will call the number listed on your records 48-72 hours following your procedure to check on you and address any questions or concerns that you may have regarding the information given to you following your procedure. If we do not reach you, we will leave a message.  We will attempt to reach you two times.  During this call, we will ask if you have developed any symptoms of COVID 19. If you develop any symptoms (ie: fever, flu-like symptoms, shortness of breath, cough etc.) before then, please call [phone].  If you test positive for Covid 19 in the 2 weeks post procedure, please call and report this information to [place].    If any biopsies were taken you will be contacted by phone or by letter within the next 1-3 weeks.  Please call us at [phone] if you have not heard about the biopsies in 3 weeks.   SIGNATURES/CONFIDENTIALITY: You and/or your care partner have signed paperwork which will be entered into your electronic medical record.  These signatures attest to the fact that that the information above on your After Visit Summary has been reviewed  and is understood.  Full responsibility of the confidentiality of this discharge information lies with you and/or your care-partner. 

## 2020-06-29 NOTE — Progress Notes (Signed)
Report given to PACU, vss 

## 2020-06-29 NOTE — Progress Notes (Signed)
1548 Robinul 0.1 mg IV given due large amount of secretions upon assessment.  MD made aware, vss  ?

## 2020-07-01 ENCOUNTER — Telehealth: Payer: Self-pay

## 2020-07-01 NOTE — Telephone Encounter (Signed)
LVM

## 2020-07-08 ENCOUNTER — Other Ambulatory Visit: Payer: Self-pay | Admitting: Family Medicine

## 2020-07-14 ENCOUNTER — Other Ambulatory Visit: Payer: Self-pay | Admitting: Gastroenterology

## 2020-07-14 ENCOUNTER — Other Ambulatory Visit: Payer: Self-pay | Admitting: Family Medicine

## 2020-07-15 NOTE — Telephone Encounter (Signed)
Can patient have this antibiotic refill

## 2020-07-17 NOTE — Telephone Encounter (Signed)
He doesn't need refill for fluconazole, it was one time Rx. Please schedule follow up office visit. Thanks

## 2020-07-20 ENCOUNTER — Telehealth: Payer: Self-pay

## 2020-07-20 MED ORDER — FLUTICASONE PROPIONATE 50 MCG/ACT NA SUSP: NASAL | 3 refills | Status: DC

## 2020-07-20 NOTE — Telephone Encounter (Signed)
Rx has been refilled.  

## 2020-07-20 NOTE — Telephone Encounter (Signed)
Nurse Assessment Nurse: Junius Creamer, RN, Debra Date/Time Eilene Ghazi Time): 07/17/2020 9:31:48 PM Confirm and document reason for call. If symptomatic, describe symptoms. ---caller states flonase was not refilled and he wonders why. itchy watery eyes, runny nose, sneezing. flonase is helping. symptoms started last week. also uses astelin and hit helps as well. Does the patient have any new or worsening symptoms? ---Yes Will a triage be completed? ---Yes Related visit to physician within the last 2 weeks? ---No Does the PT have any chronic conditions? (i.e. diabetes, asthma, this includes High risk factors for pregnancy, etc.) ---Yes List chronic conditions. ---allergies asthma bronchitis htn Is this a behavioral health or substance abuse call? ---No Guidelines Guideline Title Affirmed Question Affirmed Notes Nurse Date/Time (Eastern Time) Nasal Allergies (Hay Fever) [1] Nasal allergies AND [5] only certain times of year (hay fever) Junius Creamer, RN, Debra 07/17/2020 9:41:42 PM Disp. Time Eilene Ghazi Time) Disposition Final User 07/17/2020 9:47:06 PM Home Care Yes Junius Creamer, RN, Hilda Blades PLEASE NOTE: All timestamps contained within this report are represented as [place] Standard Time. CONFIDENTIALTY NOTICE: This fax transmission is intended only for the addressee. It contains information that is legally privileged, confidential or otherwise protected from use or disclosure. If you are not the intended recipient, you are strictly prohibited from reviewing, disclosing, copying using or disseminating any of this information or taking any action in reliance on or regarding this information. If you have received this fax in error, please notify us immediately by telephone so that we can arrange for its return to [place]. Phone: [phone], Toll-Free: [phone], Fax: [phone] Page: 2 of 2 Call Id: [id number] [place] Disagree/Comply Comply Caller Understands Yes PreDisposition Home Care Care Advice Given  Per Guideline HOME CARE: * You should be able to treat this at home. NASAL WASHES FOR A STUFFY NOSE AND TO [place] OUT POLLEN: * Introduction: Saline (salt water) nasal irrigation (nasal wash) is an effective and simple home remedy for treating stuffy nose and sinus congestion. You can also [place] nasal irrigation to wash out pollen and loosen up dried mucus. The nose can be irrigated by pouring, spraying, or squirting salt water into the nose and then letting it run back out. Nasal irrigation consists of pouring, spraying, or squirting salt water into the nose and then letting it run back out. * How it Helps: The salt water rinses out excess mucus and washes out any irritants (dust, allergens) that might be present. It also moistens the nasal cavity. [place] POLLEN OFF BODY DAILY: * Remove pollen from the body with hair washing and a shower. * This is especially important right before bedtime. CALL BACK IF: * Symptoms aren't controlled in 2 days with continuous antihistamines. * You become worse CARE ADVICE given per Nasal Allergies (Hay Fever) (Adult) guideline. Comments User: Sandi Carne, RN Date/Time Eilene Ghazi Time): 07/17/2020 9:47:58 PM advised that flonase can be obtained otc. verbalizes understandin

## 2020-09-04 ENCOUNTER — Other Ambulatory Visit: Payer: Self-pay | Admitting: Gastroenterology

## 2020-09-10 ENCOUNTER — Telehealth: Payer: Self-pay

## 2020-09-10 NOTE — Telephone Encounter (Signed)
Patient is calling in stating his Flonase prescription was denied, saying he needed an appointment. Mark [name] has an upcoming appointment in August with Dr.[name] for a routine follow up, wondering if it is okay to get the nasal spray OTC.[name] Kitchen

## 2020-09-10 NOTE — Telephone Encounter (Signed)
Left message on voicemail to call office. Tell pt Rx for Flonase was sent in May with 3 refills. Need to contact pharmacy for refill.

## 2020-09-11 NOTE — Telephone Encounter (Signed)
Left message on voicemail to call office.  

## 2020-09-14 DIAGNOSIS — Z8546 Personal history of malignant neoplasm of prostate: Secondary | ICD-10-CM | POA: Diagnosis not present

## 2020-09-14 LAB — PSA: PSA: 0.015

## 2020-09-18 DIAGNOSIS — R350 Frequency of micturition: Secondary | ICD-10-CM | POA: Diagnosis not present

## 2020-09-18 DIAGNOSIS — N5201 Erectile dysfunction due to arterial insufficiency: Secondary | ICD-10-CM | POA: Diagnosis not present

## 2020-09-18 DIAGNOSIS — Z8546 Personal history of malignant neoplasm of prostate: Secondary | ICD-10-CM | POA: Diagnosis not present

## 2020-09-22 ENCOUNTER — Encounter: Payer: Self-pay | Admitting: Family Medicine

## 2020-10-05 ENCOUNTER — Other Ambulatory Visit: Payer: Self-pay | Admitting: Family Medicine

## 2020-10-19 NOTE — Progress Notes (Signed)
Phone [phone] In person visit   Subjective:   Mark [name] is a 70 y.o. year old very pleasant male patient who presents for/with See problem oriented charting Chief Complaint  Patient presents with   Hyperlipidemia   Hypertension   Anxiety   Gastroesophageal Reflux    This visit occurred during the SARS-CoV-2 public health emergency.  Safety protocols were in place, including screening questions prior to the visit, additional usage of staff PPE, and extensive cleaning of exam room while observing appropriate contact time as indicated for disinfecting solutions.   Past Medical History-  Patient Active Problem List   Diagnosis Date Noted   Asbestosis ([place]) 02/05/2016    Priority: High   Back pain 10/09/2013    Priority: High   Anxiety state 09/01/2009    Priority: High   Migraine headache 03/07/2007    Priority: High   Gastroparesis 05/01/2009    Priority: Medium   GERD 04/02/2009    Priority: Medium   Hyperlipidemia 06/11/2007    Priority: Medium   Asthma 03/12/2007    Priority: Medium   Essential hypertension 03/07/2007    Priority: Medium   PROSTATE CANCER, HX OF 03/07/2007    Priority: Medium   Allergic rhinitis 11/22/2013    Priority: Low   Overweight 01/10/2012    Priority: Low   HIATAL HERNIA 04/28/2009    Priority: Low   History of adenomatous polyp of colon 09/08/2017    Medications- reviewed and updated Current Outpatient Medications  Medication Sig Dispense Refill   albuterol (PROAIR HFA) 108 (90 Base) MCG/ACT inhaler INHALE 2 PUFFS EVERY 6 HOURS AS NEEDED FOR WHEEZING OR SHORTNESS OF BREATH 76.5 g 3   amLODipine (NORVASC) 2.5 MG tablet TAKE 1 TABLET BY MOUTH EVERY DAY 90 tablet 1   atorvastatin (LIPITOR) 20 MG tablet Take 1 tablet (20 mg total) by mouth daily. Pt is taking at bedtime 90 tablet 3   Azelastine HCl 137 MCG/SPRAY SOLN PLACE 2 SPRAYS INTO EACH NOSTRIL DAILY. 30 mL 3   budesonide-formoterol (SYMBICORT) 160-4.5 MCG/ACT inhaler  Inhale 2 puffs into the lungs 2 (two) times daily. 1 each 11   butalbital-acetaminophen-caffeine (FIORICET) 50-325-40 MG tablet TAKE 1 TABLET BY MOUTH DAILY AS NEEDED. 1 REFILL PER MONTH. 10 tablet 5   cyclobenzaprine (FLEXERIL) 10 MG tablet TAKE 0.5-1 TABLETS BY MOUTH 3 (THREE) TIMES DAILY AS NEEDED FOR MUSCLE SPASMS. 30 tablet 5   fexofenadine (ALLEGRA) 180 MG tablet Patient is taking 2 times daily 90 tablet 1   fluticasone (FLONASE) 50 MCG/ACT nasal spray USE ONE SPRAY IN EACH NOSTRIL AS DIRECTED ONCE DAILY 48 mL 3   gabapentin (NEURONTIN) 300 MG capsule Take 3 capsules (900 mg total) by mouth at bedtime. 30 capsule 1   hydrochlorothiazide (HYDRODIURIL) 25 MG tablet TAKE 1 TABLET BY MOUTH TWICE A DAY 180 tablet 1   montelukast (SINGULAIR) 10 MG tablet TAKE 1 TABLET BY MOUTH EVERYDAY AT BEDTIME 90 tablet 3   omeprazole (PRILOSEC) 40 MG capsule TAKE 1 CAPSULE (40 MG TOTAL) BY MOUTH DAILY BEFORE A MEAL. 90 capsule 3   ondansetron (ZOFRAN) 4 MG tablet TAKE 1 TABLET BY MOUTH EVERY 8 HOURS AS NEEDED FOR NAUSEA AND VOMITING 30 tablet 3   PARoxetine (PAXIL) 20 MG tablet TAKE 1 TABLET BY MOUTH EVERY DAY AT BEDTIME 90 tablet 1   Polyethylene Glycol 3350 (MIRALAX PO) Take by mouth. Miralax 238 gm bowel prep-Take as directed     promethazine (PHENERGAN) 12.5 MG tablet Take 1  tablet (12.5 mg total) by mouth daily. As needed 30 tablet 3   propranolol (INDERAL) 40 MG tablet TAKE 1 TABLET BY MOUTH TWICE A DAY 180 tablet 2   VESICARE 5 MG tablet Take 1 tablet by mouth daily.     vitamin B-12 (CYANOCOBALAMIN) 250 MCG tablet Take 250 mcg by mouth daily.     omeprazole (PRILOSEC) 40 MG capsule Take 1 capsule (40 mg total) by mouth 2 (two) times daily before a meal. 90 capsule 3   traMADol (ULTRAM) 50 MG tablet Take 1 tablet (50 mg total) by mouth daily as needed. 30 tablet 2   No current facility-administered medications for this visit.     Objective:  BP 130/82 (BP Location: Right Arm, Patient Position:  Sitting) Comment: repeat  Pulse 61   Temp 98.1 F (36.7 C) (Temporal)   Ht '5\' 10"'$  (1.778 m)   Wt 200 lb 9.6 oz (91 kg)   SpO2 97%   BMI 28.78 kg/m  Gen: NAD, resting comfortably CV: RRR no murmurs rubs or gallops Lungs: CTAB no crackles, wheeze, rhonchi Abdomen: soft/nontender/nondistended/normal bowel sounds. Ext: no edema Skin: warm, dry    Assessment and Plan   #Asbestosis class II-followed with Dr. Charlett Blake #Asthma S: Medication: Symbicort 160-4.5 mcg twice daily for maintenance added August 2021 in addition to Singulair 10 mg at bedtime, albuterol hfa as needed 3-4x a week (stated still taking out of habit- down from 10x a week)  - last visit Was overtaking albuterol out of habit- asked to only take if he felt wheezy or short of breath. Today patient reports about 3x a week A/P: consider advancing controller if use worsens- for now will continue current meds  #History of prostate cancer-robotic prostatectomy with Dr. Alinda Money in the past.  Continued to follow with urology- July 2021 visit   -For overactive bladder patient is on Vesicare Lab Results  Component Value Date   PSA 0.015 09/14/2020   PSA 0.015 09/12/2019   PSA 0.015 09/05/2017   #hypertension S: medication: Propanolol '40mg'$  twice daily, hydrochlorothiazide 25 mg  twice daily, amlodipine 2.'5Mg'$  daily. BP Readings from Last 3 Encounters:  10/21/20 130/82  06/29/20 100/65  05/27/20 (!) 160/86  A/P: Blood pressure well controlled.  We discussed possibly reducing hydrochlorothiazide to once a day but apparently we have tried this before his blood pressure increased-for now we will continue current medication  #hyperlipidemia-LDL has been below 70 S: Medication:Atorvastatin '20Mg'$  daily   Lab Results  Component Value Date   CHOL 129 10/22/2019   HDL 46 10/22/2019   LDLCALC 64 10/22/2019   LDLDIRECT 75.0 08/07/2015   TRIG 105 10/22/2019   CHOLHDL 2.8 10/22/2019   A/P: Well-controlled last check-update  lipid panel today and likely continue current medication  # Anxiety S::Medication: Paxil 20 mg at bedtime- feels reasonably controlled Suicidal ideation: None reported  A/P: Stable. Continue current medications.    #Chronic back pain-in the past, patient was on Percocet but later weaned to gabapentin and tramadol. Currently on Flexeril 10 mg up to 3 times a day as needed, gabapentin 300 mg at bedtime, tramadol 50 mg daily as needed - reasonable control- continue current meds  #Migraines S: Prophylaxis: Propranolol 40 mg twice daily for blood pressure control but also likely helped reduce frequency of migraines. -Fioricet 50-325-40 mg as needed used typically twice a week-stable lately  A/P" Stable. Continue current medications.   # GERD S:Medication: Prilosec '40Mg'$  daily.    -Endoscopy 04/08/2020 showed esophageal candidiasis treated  with fluconazole 21 days. Repeat endoscopy 06/29/20 improved B12 levels related to PPI use:  taking 500 mcg OTC daily- skips some days Lab Results  Component Value Date   VITAMINB12 384 10/22/2019   A/P: Well-controlled-esophageal candidiasis has been appropriately treated by Dr. Griselda Miner is on b12 with long term PPI use- may reduce to every 3rd day or twice a week if levels are high.    #Allergies-Astelin helpful along with Singulair 10 mg at bedtime. Reasonable control- continue current meds  #HM due for colonoscopy- he has visit 11/26/20 with Dr. Silverio Decamp  Recommended follow up: Return in about 6 months (around 04/23/2021) for follow-up or sooner if needed.. Future Appointments  Date Time Provider [place]  11/26/2020  8:30 AM Mauri Pole, MD [facility]  04/16/2021  8:45 AM [facility]   Lab/Order associations:   ICD-10-CM   1. Essential hypertension  I10 CBC with Differential/Platelet    Comprehensive metabolic panel    Lipid panel    2. Anxiety state  F41.1     3. Migraine with aura and without  status migrainosus, not intractable  G43.109     4. Chronic bilateral low back pain with left-sided sciatica  M54.42    G89.29     5. Asbestosis ([place])  J61     6. Hyperlipidemia, unspecified hyperlipidemia type  E78.5 CBC with Differential/Platelet    Comprehensive metabolic panel    Lipid panel    7. Mild intermittent asthma without complication  [id number]     8. Gastroesophageal reflux disease with esophagitis without hemorrhage  K21.00     9. High risk medication use  Z79.899 Vitamin B12      Meds ordered this encounter  Medications   traMADol (ULTRAM) 50 MG tablet    Sig: Take 1 tablet (50 mg total) by mouth daily as needed.    Dispense:  30 tablet    Refill:  2     I,Jada Bradford,acting as a scribe for Garret Reddish, MD.,have documented all relevant documentation on the behalf of Garret Reddish, MD,as directed by  Garret Reddish, MD while in the presence of Garret Reddish, MD.  I, Garret Reddish, MD, have reviewed all documentation for this visit. The documentation on 10/21/20 for the exam, diagnosis, procedures, and orders are all accurate and complete.  Return precautions advised.  Garret Reddish, MD

## 2020-10-21 ENCOUNTER — Encounter: Payer: Self-pay | Admitting: Family Medicine

## 2020-10-21 ENCOUNTER — Ambulatory Visit (INDEPENDENT_AMBULATORY_CARE_PROVIDER_SITE_OTHER): Payer: Medicare Other | Admitting: Family Medicine

## 2020-10-21 ENCOUNTER — Other Ambulatory Visit: Payer: Self-pay

## 2020-10-21 VITALS — BP 130/82 | HR 61 | Temp 98.1°F | Ht 70.0 in | Wt 200.6 lb

## 2020-10-21 DIAGNOSIS — J4520 Mild intermittent asthma, uncomplicated: Secondary | ICD-10-CM

## 2020-10-21 DIAGNOSIS — I10 Essential (primary) hypertension: Secondary | ICD-10-CM

## 2020-10-21 DIAGNOSIS — J61 Pneumoconiosis due to asbestos and other mineral fibers: Secondary | ICD-10-CM | POA: Diagnosis not present

## 2020-10-21 DIAGNOSIS — E785 Hyperlipidemia, unspecified: Secondary | ICD-10-CM

## 2020-10-21 DIAGNOSIS — Z79899 Other long term (current) drug therapy: Secondary | ICD-10-CM

## 2020-10-21 DIAGNOSIS — M5442 Lumbago with sciatica, left side: Secondary | ICD-10-CM

## 2020-10-21 DIAGNOSIS — G8929 Other chronic pain: Secondary | ICD-10-CM | POA: Diagnosis not present

## 2020-10-21 DIAGNOSIS — F411 Generalized anxiety disorder: Secondary | ICD-10-CM

## 2020-10-21 DIAGNOSIS — G43109 Migraine with aura, not intractable, without status migrainosus: Secondary | ICD-10-CM

## 2020-10-21 DIAGNOSIS — K2100 Gastro-esophageal reflux disease with esophagitis, without bleeding: Secondary | ICD-10-CM | POA: Diagnosis not present

## 2020-10-21 LAB — LIPID PANEL
Cholesterol: 111 mg/dL (ref 0–200)
HDL: 39 mg/dL — ABNORMAL LOW (ref >39.00)
LDL Cholesterol: 50 mg/dL (ref 0–99)
NonHDL: 71.66
Total CHOL/HDL Ratio: 3
Triglycerides: 107 mg/dL (ref 0.0–149.0)
VLDL: 21.4 mg/dL (ref 0.0–40.0)

## 2020-10-21 LAB — CBC WITH DIFFERENTIAL/PLATELET
Basophils Absolute: 0.1 10*3/uL (ref 0.0–0.1)
Basophils Relative: 1.2 % (ref 0.0–3.0)
Eosinophils Absolute: 0.1 10*3/uL (ref 0.0–0.7)
Eosinophils Relative: 2.6 % (ref 0.0–5.0)
HCT: 46.2 % (ref 39.0–52.0)
Hemoglobin: 15.7 g/dL (ref 13.0–17.0)
Lymphocytes Relative: 31.8 % (ref 12.0–46.0)
Lymphs Abs: 1.4 10*3/uL (ref 0.7–4.0)
MCHC: 34 g/dL (ref 30.0–36.0)
MCV: 94.2 fl (ref 78.0–100.0)
Monocytes Absolute: 0.8 10*3/uL (ref 0.1–1.0)
Monocytes Relative: 17.2 % — ABNORMAL HIGH (ref 3.0–12.0)
Neutro Abs: 2.1 10*3/uL (ref 1.4–7.7)
Neutrophils Relative %: 47.2 % (ref 43.0–77.0)
Platelets: 273 10*3/uL (ref 150.0–400.0)
RBC: 4.91 Mil/uL (ref 4.22–5.81)
RDW: 12.2 % (ref 11.5–15.5)
WBC: 4.5 10*3/uL (ref 4.0–10.5)

## 2020-10-21 LAB — COMPREHENSIVE METABOLIC PANEL
ALT: 21 U/L (ref 0–53)
AST: 19 U/L (ref 0–37)
Albumin: 4 g/dL (ref 3.5–5.2)
Alkaline Phosphatase: 86 U/L (ref 39–117)
BUN: 10 mg/dL (ref 6–23)
CO2: 29 mEq/L (ref 19–32)
Calcium: 9.3 mg/dL (ref 8.4–10.5)
Chloride: 98 mEq/L (ref 96–112)
Creatinine, Ser: 0.89 mg/dL (ref 0.40–1.50)
GFR: 87.69 mL/min (ref >60.00)
Glucose, Bld: 90 mg/dL (ref 70–99)
Potassium: 3.1 mEq/L — ABNORMAL LOW (ref 3.5–5.1)
Sodium: 138 mEq/L (ref 135–145)
Total Bilirubin: 0.8 mg/dL (ref 0.2–1.2)
Total Protein: 6.9 g/dL (ref 6.0–8.3)

## 2020-10-21 LAB — VITAMIN B12: Vitamin B-12: 538 pg/mL (ref 211–911)

## 2020-10-21 MED ORDER — TRAMADOL HCL 50 MG PO TABS: 50.0000 mg | ORAL_TABLET | Freq: Every day | ORAL | 2 refills | Status: DC | PRN

## 2020-10-21 NOTE — Patient Instructions (Addendum)
Health Maintenance Due  Topic Date Due   Zoster Vaccines- Shingrix (1 of 2)  - Please check with your pharmacy to see if they have the shingrix vaccine. If they do- please get this immunization and update [place] by phone call or mychart with dates you receive the vaccine.   Never done   COVID-19 Vaccine (4 - Booster for [employer] series)   -Wait on new varianct COVID-19 vaccination in the Fall.  02/27/2020   COLONOSCOPY (Pts 45-76yr Insurance coverage will need to be confirmed)   - Keep visit with Dr.Nandigam  10/03/2020   INFLUENZA VACCINE Not available in office yet.   - Please consider getting your flu shot in the Fall. If you get this outside of our office, please let [place].  09/28/2020   Please stop by lab before you go If you have mychart- we will send your results within 3 business days of [place]ceiving them.  If you do not have mychart- we will call you about results within 5 business days of [place]ceiving them.  *please also note that you will see labs on mychart as soon as they post. I will later go in and write notes on them- will say "notes from Dr. HYong Channel  Recommended follow up: Return in about 6 months (around 04/23/2021) for follow-up or sooner if needed..[name] Kitchen

## 2020-11-09 ENCOUNTER — Telehealth: Payer: Self-pay

## 2020-11-09 ENCOUNTER — Other Ambulatory Visit: Payer: Self-pay | Admitting: Family Medicine

## 2020-11-09 NOTE — Telephone Encounter (Signed)
Patient returned call regarding lab results. Please return call

## 2020-11-09 NOTE — Telephone Encounter (Signed)
Unable to reach pt, will try again later.

## 2020-11-10 NOTE — Telephone Encounter (Signed)
Called and spoke with pt and results reviewed from 08/24.

## 2020-11-10 NOTE — Telephone Encounter (Signed)
Pt called back about labs

## 2020-11-26 ENCOUNTER — Encounter: Payer: Self-pay | Admitting: Gastroenterology

## 2020-11-26 ENCOUNTER — Ambulatory Visit (INDEPENDENT_AMBULATORY_CARE_PROVIDER_SITE_OTHER): Payer: Medicare Other | Admitting: Gastroenterology

## 2020-11-26 VITALS — BP 146/82 | HR 64 | Ht 70.0 in | Wt 200.0 lb

## 2020-11-26 DIAGNOSIS — K219 Gastro-esophageal reflux disease without esophagitis: Secondary | ICD-10-CM

## 2020-11-26 DIAGNOSIS — K5904 Chronic idiopathic constipation: Secondary | ICD-10-CM

## 2020-11-26 DIAGNOSIS — Z86010 Personal history of colonic polyps: Secondary | ICD-10-CM | POA: Diagnosis not present

## 2020-11-26 DIAGNOSIS — R110 Nausea: Secondary | ICD-10-CM

## 2020-11-26 DIAGNOSIS — E739 Lactose intolerance, unspecified: Secondary | ICD-10-CM | POA: Diagnosis not present

## 2020-11-26 MED ORDER — OMEPRAZOLE 40 MG PO CPDR
DELAYED_RELEASE_CAPSULE | ORAL | 3 refills | Status: DC
Start: 2020-11-26 — End: 2021-12-09

## 2020-11-26 MED ORDER — ONDANSETRON HCL 4 MG PO TABS: ORAL_TABLET | ORAL | 3 refills | Status: DC

## 2020-11-26 MED ORDER — PLENVU 140 G PO SOLR: ORAL | 0 refills | Status: DC

## 2020-11-26 NOTE — Patient Instructions (Addendum)
You have been scheduled for a colonoscopy. Please follow written instructions given to you at your visit today.  Please pick up your prep supplies at the pharmacy within the next 1-3 days. If you use inhalers (even only as needed), please bring them with you on the day of your procedure.   We have sent prescription refills to your pharmacy  Due to recent changes in healthcare laws, you may see the results of your imaging and laboratory studies on MyChart before your provider has had a chance to review them.  We understand that in some cases there may be results that are confusing or concerning to you. Not all laboratory results come back in the same time frame and the provider may be waiting for multiple results in order to interpret others.  Please give [place] 48 hours in order for your provider to thoroughly review all the results before contacting the office for clarification of your results.    If you are age 56 or older, your body mass index should be between 23-30. Your Body mass index is 28.7 kg/m. If this is out of the aforementioned range listed, please consider follow up with your Primary Care Provider.  If you are age 73 or younger, your body mass index should be between 19-25. Your Body mass index is 28.7 kg/m. If this is out of the aformentioned range listed, please consider follow up with your Primary Care Provider.   __________________________________________________________  The [facility] providers would like to encourage you to use [facility] to communicate with providers for non-urgent requests or questions.  Due to long hold times on the telephone, sending your provider a message by [facility] may be a faster and more efficient way to get a response.  Please allow 48 business hours for a response.  Please remember that this is for non-urgent requests.    I appreciate the  opportunity to care for you  Thank You   Harl Bowie , MD

## 2020-11-26 NOTE — Progress Notes (Signed)
Mark [name]    [id number]    [birth year]  Primary Care Physician:[name], [name], MD  Referring Physician: Marin Olp, [address] [place],  [place] [address]   Chief complaint: History of colon polyps  HPI: 70 year old very pleasant gentleman here for follow-up visit for dysphagia, GERD and chronic nausea Overall his symptoms have improved after EGD with esophageal dilation   He is using Lactaid tablets when he consumes milk or dairy products, no longer has excessive belching or bloating. He continues to have intermittent nausea but improved compared to prior Denies any vomiting, abdominal pain, melena or bright red blood per rectum   EGD Jun 29, 2020 - LA Grade B reflux esophagitis with no bleeding. - 3 cm hiatal hernia. - Benign-appearing esophageal stenosis. Dilated.  EGD 04/08/20 - The lumen of the lower third of the esophagus was mildly dilated with stasis of secretions. - Esophagitis was found 25 to 38 cm from the incisors. Biopsies were taken with a cold forceps for histology, showed evidence of Candida esophagitis - A 5 cm hiatal hernia was present. - Patchy mild inflammation characterized by congestion (edema), erosions and erythema was found in the gastric body, in the gastric antrum and in the prepyloric region of the stomach. Biopsies were taken with a cold forceps for Helicobacter pylori testing. - The examined duodenum was normal.   4 hours gastric emptying scan 2018: Normal HIDA scan: Normal 65% gallbladder ejection fraction EGD February 2011: Mild gastritis and esophagitis otherwise normal exam Colonoscopy 2014: Removal of tubular adenoma Colonoscopy August 2019 with removal of 6 sessile polyps [tubular adenoma and sessile serrated adenomas], recall colonoscopy in 3 years.   Outpatient Encounter Medications as of 11/26/2020  Medication Sig   albuterol (PROAIR HFA) 108 (90 Base) MCG/ACT inhaler INHALE 2 PUFFS EVERY 6 HOURS  AS NEEDED FOR WHEEZING OR SHORTNESS OF BREATH   amLODipine (NORVASC) 2.5 MG tablet TAKE 1 TABLET BY MOUTH EVERY DAY   atorvastatin (LIPITOR) 20 MG tablet Take 1 tablet (20 mg total) by mouth daily. Pt is taking at bedtime   Azelastine HCl 137 MCG/SPRAY SOLN PLACE 2 SPRAYS INTO EACH NOSTRIL DAILY.   butalbital-acetaminophen-caffeine (FIORICET) 50-325-40 MG tablet TAKE 1 TABLET BY MOUTH DAILY AS NEEDED. 1 REFILL PER MONTH.   cyclobenzaprine (FLEXERIL) 10 MG tablet TAKE 0.5-1 TABLETS BY MOUTH 3 (THREE) TIMES DAILY AS NEEDED FOR MUSCLE SPASMS.   fexofenadine (ALLEGRA) 180 MG tablet Patient is taking 2 times daily   fluticasone (FLONASE) 50 MCG/ACT nasal spray USE ONE SPRAY IN EACH NOSTRIL AS DIRECTED ONCE DAILY   gabapentin (NEURONTIN) 300 MG capsule Take 3 capsules (900 mg total) by mouth at bedtime.   hydrochlorothiazide (HYDRODIURIL) 25 MG tablet TAKE 1 TABLET BY MOUTH TWICE A DAY   montelukast (SINGULAIR) 10 MG tablet TAKE 1 TABLET BY MOUTH EVERYDAY AT BEDTIME   omeprazole (PRILOSEC) 40 MG capsule TAKE 1 CAPSULE (40 MG TOTAL) BY MOUTH DAILY BEFORE A MEAL.   ondansetron (ZOFRAN) 4 MG tablet TAKE 1 TABLET BY MOUTH EVERY 8 HOURS AS NEEDED FOR NAUSEA AND VOMITING   PARoxetine (PAXIL) 20 MG tablet TAKE 1 TABLET BY MOUTH EVERY DAY AT BEDTIME   Polyethylene Glycol 3350 (MIRALAX PO) Take by mouth. Miralax 238 gm bowel prep-Take as directed   promethazine (PHENERGAN) 12.5 MG tablet Take 1 tablet (12.5 mg total) by mouth daily. As needed   propranolol (INDERAL) 40 MG tablet TAKE 1 TABLET  BY MOUTH TWICE A DAY   SYMBICORT 160-4.5 MCG/ACT inhaler TAKE 2 PUFFS BY MOUTH TWICE A DAY   traMADol (ULTRAM) 50 MG tablet Take 1 tablet (50 mg total) by mouth daily as needed.   VESICARE 5 MG tablet Take 1 tablet by mouth daily.   vitamin B-12 (CYANOCOBALAMIN) 500 MCG tablet Take 500 mcg by mouth daily.   [DISCONTINUED] omeprazole (PRILOSEC) 40 MG capsule Take 1 capsule (40 mg total) by mouth 2 (two) times daily before  a meal.   [DISCONTINUED] vitamin B-12 (CYANOCOBALAMIN) 250 MCG tablet Take 250 mcg by mouth daily.   No facility-administered encounter medications on file as of 11/26/2020.    Allergies as of 11/26/2020 - Review Complete 10/21/2020  Allergen Reaction Noted   Propoxyphene hcl Nausea And Vomiting 03/27/2006    Past Medical History:  Diagnosis Date   Allergy    seasonal   Anxiety state, unspecified 09/01/2009   ASTHMA 03/12/2007   Asthma    Cancer ([place]) 2008   prostate cancer   Gastroparesis 05/01/2009   GERD 04/02/2009   Headache(784.0) 03/07/2007   HIATAL HERNIA 04/28/2009   Hiatal hernia    HYPERLIPIDEMIA 06/11/2007   HYPERTENSION 03/07/2007   Pneumonia    hx of   Polyp of colon, hyperplastic    PROSTATE CANCER, HX OF 03/07/2007   Suspected exposure to asbestos    have asbestosis testing every year   TRANSAMINASES, SERUM, ELEVATED 04/28/2009   Tubular adenoma of colon    Ulcer    as teenager    Past Surgical History:  Procedure Laterality Date   APPENDECTOMY     BACK SURGERY  05/2011   COLONOSCOPY     LUMBAR LAMINECTOMY/DECOMPRESSION MICRODISCECTOMY N/A 10/09/2013   Procedure: L4-5 DECOMPRESSION/FARAMINOTOMY REVISION ;  Surgeon: Melina Schools, MD;  Location: [place];  Service: Orthopedics;  Laterality: N/A;   PROSTATECTOMY  07/10/06   ROTATOR CUFF REPAIR     9/09 on right   TRANSFORAMINAL LUMBAR INTERBODY FUSION (TLIF) WITH PEDICLE SCREW FIXATION 1 LEVEL N/A 10/09/2013   Procedure: TLIF L5-S1;  Surgeon: Melina Schools, MD;  Location: [facility];  Service: Orthopedics;  Laterality: N/A;    Family History  Problem Relation Age of Onset   Pancreatic cancer Mother 55   Hypertension Father    Heart attack Father        55   Hypertension Brother        x 2   Hypertension Brother    Kidney disease Brother    Hypertension Brother    Multiple sclerosis Sister    Heart disease Sister    Heart attack Sister    Hypertension Son    Heart attack Maternal Grandmother    Heart attack  Paternal Grandmother    Heart attack Paternal Grandfather    Colon cancer Neg Hx    Rectal cancer Neg Hx    Stomach cancer Neg Hx    Esophageal cancer Neg Hx     Social History   Socioeconomic History   Marital status: Married    Spouse name: Not on file   Number of children: 4   Years of education: Not on file   Highest education level: Not on file  Occupational History   Occupation: Disabled  Tobacco Use   Smoking status: Former    Packs/day: 0.50    Years: 2.00    Pack years: 1.00    Types: Cigarettes    Quit date: 04/09/1983    Years since quitting: 53.6  Smokeless tobacco: Never  Vaping Use   Vaping Use: Never used  Substance and Sexual Activity   Alcohol use: No    Alcohol/week: 0.0 standard drinks   Drug use: No   Sexual activity: Yes  Other Topics Concern   Not on file  Social History Narrative   Married 1974. Wife-Helen. 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       Retired from [employer]: hunting, boat (off for 1 year in 2015)   Social Determinants of Health   Financial Resource Strain: Low Risk    Difficulty of Paying Living Expenses: Not hard at [employer] Insecurity: No Food Insecurity   Worried About Charity fundraiser in the Last Year: Never true   Arboriculturist in the Last Year: Never true  Transportation Needs: No Transportation Needs   Film/video editor (Medical): No   Lack of Transportation (Non-Medical): No  Physical Activity: Inactive   Days of Exercise per Week: 0 days   Minutes of Exercise per Session: 0 min  Stress: No Stress Concern Present   Feeling of Stress : Not at all  Social Connections: Moderately Integrated   Frequency of Communication with Friends and Family: More than three times a week   Frequency of Social Gatherings with Friends and Family: More than  three times a week   Attends Religious Services: 1 to 4 times per year   Active Member of [employer] or Organizations: No   Attends Archivist Meetings: Never   Marital Status: Married  Human resources officer Violence: Not At Risk   Fear of Current or Ex-Partner: No   Emotionally Abused: No   Physically Abused: No   Sexually Abused: No      Review of systems: All other review of systems negative except as mentioned in the HPI.   Physical Exam: Vitals:   11/26/20 0830  BP: (!) 146/82  Pulse: 64   Body mass index is 28.7 kg/m. Gen:      No acute distress HEENT:  sclera anicteric Abd:      soft, non-tender; no palpable masses, no distension Ext:    No edema Neuro: alert and oriented x 3 Psych: normal mood and affect  Data Reviewed:  Reviewed labs, radiology imaging, old records and pertinent past GI work up   Assessment and Plan/Recommendations:  70 year old very pleasant gentleman with history of hypertension, hyperlipidemia, anxiety disorder, hiatal hernia and chronic GERD   GERD and hiatal hernia: Continue omeprazole daily and antireflux measures   Dysphagia and odynophagia: Improved s/p EGD with esophageal dilation and treatment of Candida esophagitis with Diflucan    History of advanced adenomatous colon polyps: Due for surveillance colonoscopy, will schedule it The risks and benefits as well as alternatives of endoscopic procedure(s) have been discussed and reviewed. All questions answered. The patient agrees to proceed.  Chronic nausea: Multifactorial, overall improving Use Zofran 4 mg daily as needed for severe nausea episodes  Abdominal bloating and lactose intolerance: Continue to avoid milk and dairy products or use Lactaid tablets as needed  Return in 3 months or sooner if needed  The patient was provided an opportunity to ask questions and all were answered. The patient agreed with the plan and demonstrated an understanding of the instructions.  K.  Denzil Magnuson , MD    CC: Marin Olp, MD

## 2020-12-03 DIAGNOSIS — Z23 Encounter for immunization: Secondary | ICD-10-CM | POA: Diagnosis not present

## 2020-12-05 ENCOUNTER — Other Ambulatory Visit: Payer: Self-pay | Admitting: Family Medicine

## 2020-12-09 ENCOUNTER — Other Ambulatory Visit: Payer: Self-pay | Admitting: Family Medicine

## 2020-12-10 ENCOUNTER — Other Ambulatory Visit: Payer: Self-pay | Admitting: Family Medicine

## 2020-12-15 ENCOUNTER — Telehealth: Payer: Self-pay

## 2020-12-15 ENCOUNTER — Other Ambulatory Visit: Payer: Self-pay | Admitting: Family Medicine

## 2020-12-15 MED ORDER — MONTELUKAST SODIUM 10 MG PO TABS: ORAL_TABLET | ORAL | 3 refills | Status: DC

## 2020-12-15 NOTE — Telephone Encounter (Signed)
Medication refilled

## 2020-12-15 NOTE — Telephone Encounter (Signed)
LAST APPOINTMENT DATE:  10/21/20  NEXT APPOINTMENT DATE: 04/26/20  MEDICATION:montelukast (SINGULAIR) 10 MG tablet  PHARMACY: [id number] - [place], [place]

## 2020-12-17 ENCOUNTER — Other Ambulatory Visit: Payer: Self-pay

## 2020-12-17 ENCOUNTER — Encounter: Payer: Self-pay | Admitting: Gastroenterology

## 2020-12-17 ENCOUNTER — Ambulatory Visit (AMBULATORY_SURGERY_CENTER): Payer: Medicare Other | Admitting: Gastroenterology

## 2020-12-17 VITALS — BP 112/71 | HR 57 | Temp 97.5°F | Resp 19 | Ht 70.0 in | Wt 200.0 lb

## 2020-12-17 DIAGNOSIS — Z86010 Personal history of colonic polyps: Secondary | ICD-10-CM

## 2020-12-17 DIAGNOSIS — D122 Benign neoplasm of ascending colon: Secondary | ICD-10-CM

## 2020-12-17 DIAGNOSIS — D123 Benign neoplasm of transverse colon: Secondary | ICD-10-CM

## 2020-12-17 DIAGNOSIS — D124 Benign neoplasm of descending colon: Secondary | ICD-10-CM | POA: Diagnosis not present

## 2020-12-17 DIAGNOSIS — I10 Essential (primary) hypertension: Secondary | ICD-10-CM | POA: Diagnosis not present

## 2020-12-17 MED ORDER — SODIUM CHLORIDE 0.9 % IV SOLN: 500.0000 mL | Freq: Once | INTRAVENOUS | Status: DC

## 2020-12-17 NOTE — Progress Notes (Signed)
Vitals-SH

## 2020-12-17 NOTE — Progress Notes (Signed)
Called to room to assist during endoscopic procedure.  Patient ID and intended procedure confirmed with present staff. Received instructions for my participation in the procedure from the performing physician.  

## 2020-12-17 NOTE — Progress Notes (Signed)
[facility] History and Physical   Primary Care Physician:  Marin Olp, MD   Reason for Procedure:  History of adenomatous colon polyps  Plan:    Surveillance colonoscopy with possible interventions as needed     HPI: Mark [name] is a very pleasant 70 y.o. male here for  colonoscopy. Denies any nausea, vomiting, abdominal pain, melena or bright red blood per rectum  The risks and benefits as well as alternatives of endoscopic procedure(s) have been discussed and reviewed. All questions answered. The patient agrees to proceed.    Past Medical History:  Diagnosis Date   Allergy    seasonal   Anxiety state, unspecified 09/01/2009   ASTHMA 03/12/2007   Asthma    Cancer ([place]) 2008   prostate cancer   Gastroparesis 05/01/2009   GERD 04/02/2009   Headache(784.0) 03/07/2007   HIATAL HERNIA 04/28/2009   Hiatal hernia    HYPERLIPIDEMIA 06/11/2007   HYPERTENSION 03/07/2007   Pneumonia    hx of   Polyp of colon, hyperplastic    PROSTATE CANCER, HX OF 03/07/2007   Suspected exposure to asbestos    have asbestosis testing every year   TRANSAMINASES, SERUM, ELEVATED 04/28/2009   Tubular adenoma of colon    Ulcer    as teenager    Past Surgical History:  Procedure Laterality Date   APPENDECTOMY     BACK SURGERY  05/2011   COLONOSCOPY     LUMBAR LAMINECTOMY/DECOMPRESSION MICRODISCECTOMY N/A 10/09/2013   Procedure: L4-5 DECOMPRESSION/FARAMINOTOMY REVISION ;  Surgeon: Melina Schools, MD;  Location: [facility];  Service: Orthopedics;  Laterality: N/A;   PROSTATECTOMY  07/10/06   ROTATOR CUFF REPAIR     9/09 on right   TRANSFORAMINAL LUMBAR INTERBODY FUSION (TLIF) WITH PEDICLE SCREW FIXATION 1 LEVEL N/A 10/09/2013   Procedure: TLIF L5-S1;  Surgeon: Melina Schools, MD;  Location: [facility];  Service: Orthopedics;  Laterality: N/A;    Prior to Admission medications   Medication Sig Start Date End Date Taking? Authorizing Provider  albuterol (PROAIR HFA) 108 (90 Base) MCG/ACT  inhaler INHALE 2 PUFFS EVERY 6 HOURS AS NEEDED FOR WHEEZING OR SHORTNESS OF BREATH 10/22/19  Yes Marin Olp, MD  amLODipine (NORVASC) 2.5 MG tablet TAKE 1 TABLET BY MOUTH EVERY DAY 07/15/20  Yes Marin Olp, MD  atorvastatin (LIPITOR) 20 MG tablet TAKE 1 TABLET (20 MG TOTAL) BY MOUTH DAILY. PT IS TAKING AT BEDTIME 12/08/20  Yes Marin Olp, MD  Azelastine HCl 137 MCG/SPRAY SOLN PLACE 2 SPRAYS INTO EACH NOSTRIL DAILY. 10/07/20  Yes Marin Olp, MD  cyclobenzaprine (FLEXERIL) 10 MG tablet TAKE 0.5-1 TABLETS BY MOUTH 3 (THREE) TIMES DAILY AS NEEDED FOR MUSCLE SPASMS. 04/09/20  Yes Marin Olp, MD  fexofenadine [facility]) 180 MG tablet Patient is taking 2 times daily 11/21/18  Yes Marin Olp, MD  fluticasone [facility]) 50 MCG/ACT nasal spray USE ONE SPRAY IN EACH NOSTRIL AS DIRECTED ONCE DAILY 07/20/20  Yes Marin Olp, MD  gabapentin (NEURONTIN) 300 MG capsule Take 3 capsules (900 mg total) by mouth at bedtime. 04/23/19  Yes Marin Olp, MD  hydrochlorothiazide (HYDRODIURIL) 25 MG tablet TAKE 1 TABLET BY MOUTH TWICE A DAY 10/14/19  Yes Marin Olp, MD  montelukast (SINGULAIR) 10 MG tablet TAKE 1 TABLET BY MOUTH EVERYDAY AT BEDTIME 12/15/20  Yes Marin Olp, MD  omeprazole (PRILOSEC) 40 MG capsule TAKE 1 CAPSULE (40 MG TOTAL) BY MOUTH DAILY BEFORE A MEAL. 11/26/20  Yes  Mauri Pole, MD  ondansetron (ZOFRAN) 4 MG tablet TAKE 1 TABLET BY MOUTH EVERY 8 HOURS AS NEEDED FOR NAUSEA AND VOMITING 11/26/20  Yes Vernadette Stutsman, Venia Minks, MD  PARoxetine (PAXIL) 20 MG tablet TAKE 1 TABLET BY MOUTH EVERY DAY AT BEDTIME 07/08/20  Yes Marin Olp, MD  promethazine (PHENERGAN) 12.5 MG tablet Take 1 tablet (12.5 mg total) by mouth daily. As needed 03/09/20  Yes Koree Staheli, Venia Minks, MD  propranolol (INDERAL) 40 MG tablet TAKE 1 TABLET BY MOUTH TWICE A DAY 12/14/20  Yes Marin Olp, MD  SYMBICORT 160-4.5 MCG/ACT inhaler TAKE 2 PUFFS BY MOUTH TWICE A DAY 11/09/20   Yes Marin Olp, MD  traMADol (ULTRAM) 50 MG tablet Take 1 tablet (50 mg total) by mouth daily as needed. 10/21/20  Yes Marin Olp, MD  VESICARE 5 MG tablet Take 1 tablet by mouth daily. 06/01/16  Yes [provider]  vitamin B-12 (CYANOCOBALAMIN) 500 MCG tablet Take 500 mcg by mouth daily.   Yes [provider]  butalbital-acetaminophen-caffeine (FIORICET) 50-325-40 MG tablet TAKE 1 TABLET BY MOUTH DAILY AS NEEDED. 1 REFILL PER MONTH. 07/15/20   Marin Olp, MD    Current Outpatient Medications  Medication Sig Dispense Refill   albuterol (PROAIR HFA) 108 (90 Base) MCG/ACT inhaler INHALE 2 PUFFS EVERY 6 HOURS AS NEEDED FOR WHEEZING OR SHORTNESS OF BREATH 76.5 g 3   amLODipine (NORVASC) 2.5 MG tablet TAKE 1 TABLET BY MOUTH EVERY DAY 90 tablet 1   atorvastatin (LIPITOR) 20 MG tablet TAKE 1 TABLET (20 MG TOTAL) BY MOUTH DAILY. PT IS TAKING AT BEDTIME 90 tablet 3   Azelastine HCl 137 MCG/SPRAY SOLN PLACE 2 SPRAYS INTO EACH NOSTRIL DAILY. 30 mL 3   cyclobenzaprine (FLEXERIL) 10 MG tablet TAKE 0.5-1 TABLETS BY MOUTH 3 (THREE) TIMES DAILY AS NEEDED FOR MUSCLE SPASMS. 30 tablet 5   fexofenadine (ALLEGRA) 180 MG tablet Patient is taking 2 times daily 90 tablet 1   fluticasone (FLONASE) 50 MCG/ACT nasal spray USE ONE SPRAY IN EACH NOSTRIL AS DIRECTED ONCE DAILY 48 mL 3   gabapentin (NEURONTIN) 300 MG capsule Take 3 capsules (900 mg total) by mouth at bedtime. 30 capsule 1   hydrochlorothiazide (HYDRODIURIL) 25 MG tablet TAKE 1 TABLET BY MOUTH TWICE A DAY 180 tablet 1   montelukast (SINGULAIR) 10 MG tablet TAKE 1 TABLET BY MOUTH EVERYDAY AT BEDTIME 90 tablet 3   omeprazole (PRILOSEC) 40 MG capsule TAKE 1 CAPSULE (40 MG TOTAL) BY MOUTH DAILY BEFORE A MEAL. 90 capsule 3   ondansetron (ZOFRAN) 4 MG tablet TAKE 1 TABLET BY MOUTH EVERY 8 HOURS AS NEEDED FOR NAUSEA AND VOMITING 30 tablet 3   PARoxetine (PAXIL) 20 MG tablet TAKE 1 TABLET BY MOUTH EVERY DAY AT BEDTIME 90 tablet 1    promethazine (PHENERGAN) 12.5 MG tablet Take 1 tablet (12.5 mg total) by mouth daily. As needed 30 tablet 3   propranolol (INDERAL) 40 MG tablet TAKE 1 TABLET BY MOUTH TWICE A DAY 180 tablet 3   SYMBICORT 160-4.5 MCG/ACT inhaler TAKE 2 PUFFS BY MOUTH TWICE A DAY 10.2 each 11   traMADol (ULTRAM) 50 MG tablet Take 1 tablet (50 mg total) by mouth daily as needed. 30 tablet 2   VESICARE 5 MG tablet Take 1 tablet by mouth daily.     vitamin B-12 (CYANOCOBALAMIN) 500 MCG tablet Take 500 mcg by mouth daily.     butalbital-acetaminophen-caffeine (FIORICET) 50-325-40 MG tablet TAKE 1 TABLET  BY MOUTH DAILY AS NEEDED. 1 REFILL PER MONTH. 10 tablet 5   Current Facility-Administered Medications  Medication Dose Route Frequency Provider Last Rate Last Admin   0.9 %  sodium chloride infusion  500 mL Intravenous Once Mauri Pole, MD        Allergies as of 12/17/2020 - Review Complete 12/17/2020  Allergen Reaction Noted   Propoxyphene hcl Nausea And Vomiting 03/27/2006    Family History  Problem Relation Age of Onset   Pancreatic cancer Mother 28   Hypertension Father    Heart attack Father        83   Hypertension Brother        x 2   Hypertension Brother    Kidney disease Brother    Hypertension Brother    Multiple sclerosis Sister    Heart disease Sister    Heart attack Sister    Hypertension Son    Heart attack Maternal Grandmother    Heart attack Paternal Grandmother    Heart attack Paternal Grandfather    Colon cancer Neg Hx    Rectal cancer Neg Hx    Stomach cancer Neg Hx    Esophageal cancer Neg Hx     Social History   Socioeconomic History   Marital status: Married    Spouse name: Not on file   Number of children: 4   Years of education: Not on file   Highest education level: Not on file  Occupational History   Occupation: Disabled  Tobacco Use   Smoking status: Former    Packs/day: 0.50    Years: 2.00    Pack years: 1.00    Types: Cigarettes    Quit date:  04/09/1983    Years since quitting: 37.7   Smokeless tobacco: Never  Vaping Use   Vaping Use: Never used  Substance and Sexual Activity   Alcohol use: No    Alcohol/week: 0.0 standard drinks   Drug use: No   Sexual activity: Yes  Other Topics Concern   Not on file  Social History Narrative   Married 1974. Wife-Helen. 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       Retired from [employer]: hunting, boat (off for 1 year in 2015)   Social Determinants of Health   Financial Resource Strain: Low Risk    Difficulty of Paying Living Expenses: Not hard at [employer] Insecurity: No Food Insecurity   Worried About Charity fundraiser in the Last Year: Never true   Arboriculturist in the Last Year: Never true  Transportation Needs: No Transportation Needs   Film/video editor (Medical): No   Lack of Transportation (Non-Medical): No  Physical Activity: Inactive   Days of Exercise per Week: 0 days   Minutes of Exercise per Session: 0 min  Stress: No Stress Concern Present   Feeling of Stress : Not at all  Social Connections: Moderately Integrated   Frequency of Communication with Friends and Family: More than three times a week   Frequency of Social Gatherings with Friends and Family: More than three times a week   Attends Religious Services: 1 to 4 times per year   Active Member of [employer] or Organizations: No   Attends Archivist Meetings: Never   Marital  Status: Married  Human resources officer Violence: Not At Risk   Fear of Current or Ex-Partner: No   Emotionally Abused: No   Physically Abused: No   Sexually Abused: No    Review of Systems:  All other review of systems negative except as mentioned in the HPI.  Physical Exam: Vital signs in last 24 hours: BP 108/74   Pulse (!) 53   Temp (!) 97.5 F (36.4 C)    Ht 5\' 10"  (1.778 m)   Wt 200 lb (90.7 kg)   SpO2 97%   BMI 28.70 kg/m     General:   Alert, NAD Lungs:  Clear .   Heart:  Regular rate and rhythm Abdomen:  Soft, nontender and nondistended. Neuro/Psych:  Alert and cooperative. Normal mood and affect. A and O x 3  Reviewed labs, radiology imaging, old records and pertinent past GI work up  Patient is appropriate for planned procedure(s) and anesthesia in an ambulatory setting   K. Denzil Magnuson , MD [phone]

## 2020-12-17 NOTE — Op Note (Signed)
[facility] Patient Name: Mark [name] Procedure Date: 12/17/2020 10:30 AM MRN: [id number] Endoscopist: Mauri Pole , MD Age: 70 Referring MD:  Date of Birth: 1950-06-28 Gender: Male Account #: [id number] Procedure:                Colonoscopy Indications:              High risk colon cancer surveillance: Personal                            history of colonic polyps, High risk colon cancer                            surveillance: Personal history of multiple (3 or                            more) adenomas Medicines:                Monitored Anesthesia Care Procedure:                Pre-Anesthesia Assessment:                           - Prior to the procedure, a History and Physical                            was performed, and patient medications and                            allergies were reviewed. The patient's tolerance of                            previous anesthesia was also reviewed. The risks                            and benefits of the procedure and the sedation                            options and risks were discussed with the patient.                            All questions were answered, and informed consent                            was obtained. Prior Anticoagulants: The patient has                            taken no previous anticoagulant or antiplatelet                            agents. ASA Grade Assessment: II - A patient with                            mild systemic disease. After reviewing the risks  and benefits, the patient was deemed in                            satisfactory condition to undergo the procedure.                           After obtaining informed consent, the colonoscope                            was passed under direct vision. Throughout the                            procedure, the patient's blood pressure, pulse, and                            oxygen saturations were monitored  continuously. The                            Olympus PCF-H190DL ([id number]) Colonoscope was                            introduced through the anus and advanced to the the                            cecum, identified by appendiceal orifice and                            ileocecal valve. The colonoscopy was performed                            without difficulty. The patient tolerated the                            procedure well. The quality of the bowel                            preparation was adequate. The ileocecal valve,                            appendiceal orifice, and rectum were photographed. Scope In: 10:39:03 AM Scope Out: 10:55:46 AM Scope Withdrawal Time: 0 hours 8 minutes 53 seconds  Total Procedure Duration: 0 hours 16 minutes 43 seconds  Findings:                 The perianal and digital rectal examinations were                            normal.                           Four sessile polyps were found in the descending                            colon X1, transverse colon X 2 and ascending colon  X 1. The polyps were 4 to 6 mm in size. These                            polyps were removed with a cold snare. Resection                            and retrieval were complete.                           Non-bleeding external and internal hemorrhoids were                            found during retroflexion. The hemorrhoids were                            medium-sized. Complications:            No immediate complications. Estimated Blood Loss:     Estimated blood loss was minimal. Impression:               - Four 4 to 6 mm polyps in the descending colon, in                            the transverse colon and in the ascending colon,                            removed with a cold snare. Resected and retrieved.                           - Non-bleeding external and internal hemorrhoids. Recommendation:           - Patient has a contact number available  for                            emergencies. The signs and symptoms of potential                            delayed complications were discussed with the                            patient. Return to normal activities tomorrow.                            Written discharge instructions were provided to the                            patient.                           - Resume previous diet.                           - Continue present medications.                           - Await pathology results.                           -  Repeat colonoscopy in 3 - 5 years for                            surveillance based on pathology results. Mauri Pole, MD 12/17/2020 11:00:24 AM This report has been signed electronically.

## 2020-12-17 NOTE — Patient Instructions (Signed)
Resume previous diet and continue current medications. Awaiting pathology results. Repeat colonoscopy in 3-5 years based on pathology results.  YOU HAD AN ENDOSCOPIC PROCEDURE TODAY AT [facility]:   Refer to the procedure report that was given to you for any specific questions about what was found during the examination.  If the procedure report does not answer your questions, please call your gastroenterologist to clarify.  If you requested that your care partner not be given the details of your procedure findings, then the procedure report has been included in a sealed envelope for you to review at your convenience later.  YOU SHOULD EXPECT: Some feelings of bloating in the abdomen. Passage of more gas than usual.  Walking can help get rid of the air that was put into your GI tract during the procedure and reduce the bloating. If you had a lower endoscopy (such as a colonoscopy or flexible sigmoidoscopy) you may notice spotting of blood in your stool or on the toilet paper. If you underwent a bowel prep for your procedure, you may not have a normal bowel movement for a few days.  Please Note:  You might notice some irritation and congestion in your nose or some drainage.  This is from the oxygen used during your procedure.  There is no need for concern and it should clear up in a day or so.  SYMPTOMS TO REPORT IMMEDIATELY:  Following lower endoscopy (colonoscopy or flexible sigmoidoscopy):  Excessive amounts of blood in the stool  Significant tenderness or worsening of abdominal pains  Swelling of the abdomen that is new, acute  Fever of 100F or higher  For urgent or emergent issues, a gastroenterologist can be reached at any hour by calling [phone]. Do not use MyChart messaging for urgent concerns.    DIET:  We do recommend a small meal at first, but then you may proceed to your regular diet.  Drink plenty of fluids but you should avoid alcoholic beverages for 24  hours.  ACTIVITY:  You should plan to take it easy for the rest of today and you should NOT DRIVE or use heavy machinery until tomorrow (because of the sedation medicines used during the test).    FOLLOW UP: Our staff will call the number listed on your records 48-72 hours following your procedure to check on you and address any questions or concerns that you may have regarding the information given to you following your procedure. If we do not reach you, we will leave a message.  We will attempt to reach you two times.  During this call, we will ask if you have developed any symptoms of COVID 19. If you develop any symptoms (ie: fever, flu-like symptoms, shortness of breath, cough etc.) before then, please call [phone].  If you test positive for Covid 19 in the 2 weeks post procedure, please call and report this information to [place].    If any biopsies were taken you will be contacted by phone or by letter within the next 1-3 weeks.  Please call us at [phone] if you have not heard about the biopsies in 3 weeks.    SIGNATURES/CONFIDENTIALITY: You and/or your care partner have signed paperwork which will be entered into your electronic medical record.  These signatures attest to the fact that that the information above on your After Visit Summary has been reviewed and is understood.  Full responsibility of the confidentiality of this discharge information lies with you and/or your care-partner.

## 2020-12-17 NOTE — Progress Notes (Signed)
Report given to PACU, vss 

## 2020-12-18 ENCOUNTER — Encounter: Payer: Self-pay | Admitting: Gastroenterology

## 2020-12-21 ENCOUNTER — Telehealth: Payer: Self-pay

## 2020-12-21 ENCOUNTER — Telehealth: Payer: Self-pay | Admitting: *Deleted

## 2020-12-21 NOTE — Telephone Encounter (Signed)
  Follow up Call-  Call back number 12/17/2020 06/29/2020 04/08/2020  Post procedure Call Back phone  # [phone]  Permission to leave phone message Yes Yes Yes  Some recent data might be hidden     Patient questions:  Do you have a fever, pain , or abdominal swelling? No. Pain Score  0 *  Have you tolerated food without any problems? Yes.    Have you been able to return to your normal activities? Yes.    Do you have any questions about your discharge instructions: Diet   No. Medications  No. Follow up visit  No.  Do you have questions or concerns about your Care? No.  Actions: * If pain score is 4 or above: No action needed, pain <4.   Have you developed a fever since your procedure? no  2.   Have you had an respiratory symptoms (SOB or cough) since your procedure? no  3.   Have you tested positive for COVID 19 since your procedure no  4.   Have you had any family members/close contacts diagnosed with the COVID 19 since your procedure?  no   If yes to any of these questions please route to Joylene John, RN and Joella Prince, RN

## 2020-12-21 NOTE — Telephone Encounter (Signed)
Patient missed the follow-up call.  Please call again on his cell #.    Thank you,

## 2020-12-21 NOTE — Telephone Encounter (Signed)
Second post procedure follow up call, no answer 

## 2020-12-21 NOTE — Telephone Encounter (Signed)
Attempted f/u call back. No answer, left VM. 

## 2021-01-05 ENCOUNTER — Encounter: Payer: Self-pay | Admitting: Gastroenterology

## 2021-01-07 ENCOUNTER — Other Ambulatory Visit: Payer: Self-pay | Admitting: Family Medicine

## 2021-01-08 ENCOUNTER — Other Ambulatory Visit: Payer: Self-pay | Admitting: Family Medicine

## 2021-02-04 ENCOUNTER — Telehealth: Payer: Self-pay | Admitting: Family Medicine

## 2021-02-04 DIAGNOSIS — N281 Cyst of kidney, acquired: Secondary | ICD-10-CM

## 2021-02-04 NOTE — Telephone Encounter (Signed)
Information from [facility] from ct scan (ordered by Dr. Pearlie Oyster) about renal cyst- patient needs ultrasound and this has been ordered- please let him know. Likely just an incidental finding but we want to make sure we are not missing anything.

## 2021-02-05 ENCOUNTER — Ambulatory Visit (INDEPENDENT_AMBULATORY_CARE_PROVIDER_SITE_OTHER): Payer: Medicare Other | Admitting: Family Medicine

## 2021-02-05 ENCOUNTER — Other Ambulatory Visit: Payer: Self-pay

## 2021-02-05 ENCOUNTER — Encounter: Payer: Self-pay | Admitting: Family Medicine

## 2021-02-05 VITALS — BP 122/64 | HR 61 | Temp 98.0°F | Ht 70.0 in | Wt 202.4 lb

## 2021-02-05 DIAGNOSIS — N281 Cyst of kidney, acquired: Secondary | ICD-10-CM

## 2021-02-05 DIAGNOSIS — E785 Hyperlipidemia, unspecified: Secondary | ICD-10-CM

## 2021-02-05 DIAGNOSIS — J4520 Mild intermittent asthma, uncomplicated: Secondary | ICD-10-CM

## 2021-02-05 DIAGNOSIS — I10 Essential (primary) hypertension: Secondary | ICD-10-CM

## 2021-02-05 DIAGNOSIS — Z8546 Personal history of malignant neoplasm of prostate: Secondary | ICD-10-CM

## 2021-02-05 DIAGNOSIS — J61 Pneumoconiosis due to asbestos and other mineral fibers: Secondary | ICD-10-CM | POA: Diagnosis not present

## 2021-02-05 NOTE — Telephone Encounter (Signed)
This was discussed with pt at [facility] today.

## 2021-02-05 NOTE — Patient Instructions (Addendum)
We will call you within two weeks about your referral to kidney ultrasound. If you do not hear within 2 weeks, give [place] a call.   No more Q-tips!  Also needs extra 150 minutes a week-love that you are getting in some swimming-try to get to the 150 minutes a week goal as long as her back tolerates it  Please stop by lab before you go If you have mychart- we will send your results within 3 business days of [place] receiving them.  If you do not have mychart- we will call you about results within 5 business days of [place] receiving them.  *please also note that you will see labs on mychart as soon as they post. I will later go in and write notes on them- will say "notes from Dr. Yong Channel"   Recommended follow up: keep February visit

## 2021-02-05 NOTE — Progress Notes (Signed)
Phone [phone] In person visit   Subjective:   Mark [name] is a 70 y.o. year old very pleasant male patient who presents for/with See problem oriented charting Chief Complaint  Patient presents with   discuss CT results    This visit occurred during the SARS-CoV-2 public health emergency.  Safety protocols were in place, including screening questions prior to the visit, additional usage of staff PPE, and extensive cleaning of exam room while observing appropriate contact time as indicated for disinfecting solutions.   Past Medical History-  Patient Active Problem List   Diagnosis Date Noted   Asbestosis ([place]) 02/05/2016    Priority: High   Back pain 10/09/2013    Priority: High   Anxiety state 09/01/2009    Priority: High   Migraine headache 03/07/2007    Priority: High   Gastroparesis 05/01/2009    Priority: Medium    GERD 04/02/2009    Priority: Medium    Hyperlipidemia 06/11/2007    Priority: Medium    Asthma 03/12/2007    Priority: Medium    Essential hypertension 03/07/2007    Priority: Medium    PROSTATE CANCER, HX OF 03/07/2007    Priority: Medium    Allergic rhinitis 11/22/2013    Priority: Low   Overweight 01/10/2012    Priority: Low   HIATAL HERNIA 04/28/2009    Priority: Low   History of adenomatous polyp of colon 09/08/2017    Medications- reviewed and updated Current Outpatient Medications  Medication Sig Dispense Refill   albuterol (PROAIR HFA) 108 (90 Base) MCG/ACT inhaler INHALE 2 PUFFS EVERY 6 HOURS AS NEEDED FOR WHEEZING OR SHORTNESS OF BREATH 76.5 g 3   amLODipine (NORVASC) 2.5 MG tablet TAKE 1 TABLET BY MOUTH EVERY DAY 90 tablet 1   atorvastatin (LIPITOR) 20 MG tablet TAKE 1 TABLET (20 MG TOTAL) BY MOUTH DAILY. PT IS TAKING AT BEDTIME 90 tablet 3   Azelastine HCl 137 MCG/SPRAY SOLN PLACE 2 SPRAYS INTO EACH NOSTRIL DAILY. 30 mL 3   butalbital-acetaminophen-caffeine (FIORICET) 50-325-40 MG tablet TAKE 1 TABLET BY MOUTH DAILY AS  NEEDED. 1 REFILL PER MONTH. 10 tablet 5   cyclobenzaprine (FLEXERIL) 10 MG tablet TAKE 0.5-1 TABLETS BY MOUTH 3 (THREE) TIMES DAILY AS NEEDED FOR MUSCLE SPASMS. 30 tablet 5   fexofenadine (ALLEGRA) 180 MG tablet Patient is taking 2 times daily 90 tablet 1   fluticasone (FLONASE) 50 MCG/ACT nasal spray USE ONE SPRAY IN EACH NOSTRIL AS DIRECTED ONCE DAILY 48 mL 3   gabapentin (NEURONTIN) 300 MG capsule Take 3 capsules (900 mg total) by mouth at bedtime. 30 capsule 1   hydrochlorothiazide (HYDRODIURIL) 25 MG tablet TAKE 1 TABLET BY MOUTH TWICE A DAY 180 tablet 3   montelukast (SINGULAIR) 10 MG tablet TAKE 1 TABLET BY MOUTH EVERYDAY AT BEDTIME 90 tablet 3   omeprazole (PRILOSEC) 40 MG capsule TAKE 1 CAPSULE (40 MG TOTAL) BY MOUTH DAILY BEFORE A MEAL. 90 capsule 3   ondansetron (ZOFRAN) 4 MG tablet TAKE 1 TABLET BY MOUTH EVERY 8 HOURS AS NEEDED FOR NAUSEA AND VOMITING 30 tablet 3   PARoxetine (PAXIL) 20 MG tablet TAKE 1 TABLET BY MOUTH EVERYDAY AT BEDTIME 90 tablet 1   promethazine (PHENERGAN) 12.5 MG tablet Take 1 tablet (12.5 mg total) by mouth daily. As needed 30 tablet 3   propranolol (INDERAL) 40 MG tablet TAKE 1 TABLET BY MOUTH TWICE A DAY 180 tablet 3   SYMBICORT 160-4.5 MCG/ACT inhaler TAKE 2 PUFFS BY MOUTH TWICE  A DAY 10.2 each 11   traMADol (ULTRAM) 50 MG tablet Take 1 tablet (50 mg total) by mouth daily as needed. 30 tablet 2   VESICARE 5 MG tablet Take 1 tablet by mouth daily.     vitamin B-12 (CYANOCOBALAMIN) 500 MCG tablet Take 500 mcg by mouth daily.     No current facility-administered medications for this visit.     Objective:  BP 122/64   Pulse 61   Temp 98 F (36.7 C)   Ht 5\' 10"  (1.778 m)   Wt 202 lb 6.4 oz (91.8 kg)   SpO2 95%   BMI 29.04 kg/m  Gen: NAD, resting comfortably Mild irritation of bilateral ear canals likely from Q-tips CV: RRR no murmurs rubs or gallops Lungs: CTAB no crackles, wheeze, rhonchi Ext: no edema Skin: warm, dry     Assessment and Plan    #Renal cyst S: Incompletely visualized renal cyst on recent CT scan ordered for asbestosis follow-up from Dr. Pearlie Oyster was noted.  Dr. Pearlie Oyster sent me a letter and I ordered a follow-up renal ultrasound yesterday.  From CT chest " 1. No CT findings of pleural or pulmonary malignancy.  2. Partially visualized cystic lesion in the left kidney. This could be initially evaluated with renal ultrasound."  Patient presents today to discuss further. No pain or discomfort.  A/P: Left renal cyst incidental finding-suspect benign but needs further characterization-left renal ultrasound was ordered    #Asbestosis class II-followed with Dr. Charlett Blake #Asthma S: Medication: Symbicort for maintenance added August 2021 in addition to Singulair 10 mg, albuterol as needed.  Has albuterol on hand -Recent CT imaging of asbestosis stable A/P: Patient denies recent issues with breathing on this regimen-asthma appears well controlled-continue current medication follow-up with Dr. Pearlie Oyster  #History of prostate cancer-robotic prostatectomy with Dr. Alinda Money in the past. Continues to follow with urology. last visit july 2022 -For overactive bladder patient is on Vesicare Lab Results  Component Value Date   PSA 0.015 09/14/2020   PSA 0.015 09/12/2019   PSA 0.015 09/05/2017   #hypertension S: medication: Propanolol 40mg  twice daily, amlodipine 2.5Mg  every day, and hydrochlorothiazide 25 mg twice daily -he tried once a day with hctz and BP went up to 140s - doing banana to help potassium. Gets cramps in legs if not getting more potassium BP Readings from Last 3 Encounters:  02/05/21 122/64  12/17/20 112/71  11/26/20 (!) 146/82  A/P: blood pressure well controlled but history low potassium- will check bmp today for now continue current meds but may need supplement of potassium as well  #hyperlipidemia-LDL has been below 70 S: Medication:Atorvastatin 20Mg  daily - swimming twice a week at least- goal 150  minutes Lab Results  Component Value Date   CHOL 111 10/21/2020   HDL 39.00 (L) 10/21/2020   LDLCALC 50 10/21/2020   LDLDIRECT 75.0 08/07/2015   TRIG 107.0 10/21/2020   CHOLHDL 3 10/21/2020   A/P: well controlled - continue current meds  #left ear itching - using q tips- advised against as could cause itch/scratch cyle   Recommended follow up: Keep February visit  Future Appointments  Date Time Provider [place]  04/16/2021  8:45 AM [facility]ALTH COACH [facility]  04/26/2021  8:20 AM Marin Olp, MD [facility]    Lab/Order associations:   ICD-10-CM   1. Renal cyst, left  N28.1     2. Essential hypertension  B14 Basic metabolic panel    Basic metabolic panel  3. Asbestosis ([place])  J61     4. Mild intermittent asthma without complication  L87.27     5. Hyperlipidemia, unspecified hyperlipidemia type  E78.5     6. History of prostate cancer  Z85.46       No orders of the defined types were placed in this encounter.  I,Jada Bradford,acting as a scribe for Garret Reddish, MD.,have documented all relevant documentation on the behalf of Garret Reddish, MD,as directed by  Garret Reddish, MD while in the presence of Garret Reddish, MD.  I, Garret Reddish, MD, have reviewed all documentation for this visit. The documentation on 02/05/21 for the exam, diagnosis, procedures, and orders are all accurate and complete.   Return precautions advised.  Garret Reddish, MD

## 2021-02-06 LAB — BASIC METABOLIC PANEL
BUN: 12 mg/dL (ref 7–25)
CO2: 23 mmol/L (ref 20–32)
Calcium: 9.1 mg/dL (ref 8.6–10.3)
Chloride: 104 mmol/L (ref 98–110)
Creat: 0.8 mg/dL (ref 0.70–1.35)
Glucose, Bld: 91 mg/dL (ref 65–99)
Potassium: 4.2 mmol/L (ref 3.5–5.3)
Sodium: 139 mmol/L (ref 135–146)

## 2021-02-24 ENCOUNTER — Other Ambulatory Visit: Payer: Medicare Other

## 2021-02-25 ENCOUNTER — Ambulatory Visit
Admission: RE | Admit: 2021-02-25 | Discharge: 2021-02-25 | Disposition: A | Discharge status: Home / Self Care | Payer: Medicare Other | Source: Ambulatory Visit | Attending: Family Medicine | Admitting: Family Medicine

## 2021-02-25 DIAGNOSIS — N281 Cyst of kidney, acquired: Secondary | ICD-10-CM

## 2021-02-26 ENCOUNTER — Telehealth: Payer: Self-pay | Admitting: *Deleted

## 2021-02-26 ENCOUNTER — Other Ambulatory Visit: Payer: Self-pay | Admitting: Family Medicine

## 2021-02-26 DIAGNOSIS — N2889 Other specified disorders of kidney and ureter: Secondary | ICD-10-CM

## 2021-02-26 NOTE — Telephone Encounter (Signed)
Thanks Dr. Jerline Pain- I am going to give info on this under result note.

## 2021-02-26 NOTE — Telephone Encounter (Signed)
Mark [name] from [facility] called to give report from [place]. Per Mark [name]: per [place] of kidney:   1.4 x 1.2 x 1.1 cm hypoechoic mass in the lower pole of the left kidney cannot be characterized as a simple cyst. Further evaluation with renal mass protocol CT or MRI should be performed.

## 2021-02-26 NOTE — Telephone Encounter (Deleted)
CT is scheduled for 03/24/2021.

## 2021-02-26 NOTE — Telephone Encounter (Signed)
Noted. Please let patient know radiology recommends further imaging. I will let Dr Yong Channel order this when he comes back next week.  Algis Greenhouse. Jerline Pain, MD 02/26/2021 1:18 PM

## 2021-02-26 NOTE — Telephone Encounter (Addendum)
Patient returned call to office, gave recommendations per Dr. Yong Channel. Patient verbalized understanding. Patient stated that he has the CT scheduled for 03/24/2021.

## 2021-02-26 NOTE — Telephone Encounter (Signed)
Left message for patient to return my call.

## 2021-03-01 ENCOUNTER — Other Ambulatory Visit: Payer: Self-pay | Admitting: Family Medicine

## 2021-03-24 ENCOUNTER — Ambulatory Visit
Admission: RE | Admit: 2021-03-24 | Discharge: 2021-03-24 | Disposition: A | Discharge status: Home / Self Care | Payer: Medicare HMO | Source: Ambulatory Visit | Attending: Family Medicine | Admitting: Family Medicine

## 2021-03-24 ENCOUNTER — Other Ambulatory Visit: Payer: Medicare Other

## 2021-03-24 ENCOUNTER — Other Ambulatory Visit: Payer: Self-pay

## 2021-03-24 DIAGNOSIS — C61 Malignant neoplasm of prostate: Secondary | ICD-10-CM | POA: Diagnosis not present

## 2021-03-24 DIAGNOSIS — K449 Diaphragmatic hernia without obstruction or gangrene: Secondary | ICD-10-CM | POA: Diagnosis not present

## 2021-03-24 DIAGNOSIS — N281 Cyst of kidney, acquired: Secondary | ICD-10-CM | POA: Diagnosis not present

## 2021-03-24 DIAGNOSIS — K429 Umbilical hernia without obstruction or gangrene: Secondary | ICD-10-CM | POA: Diagnosis not present

## 2021-03-24 DIAGNOSIS — N2889 Other specified disorders of kidney and ureter: Secondary | ICD-10-CM

## 2021-03-24 MED ORDER — IOPAMIDOL (ISOVUE-300) INJECTION 61%
125.0000 mL | Freq: Once | INTRAVENOUS | Status: AC | PRN
  Administered 2021-03-24: 125 mL via INTRAVENOUS

## 2021-03-25 ENCOUNTER — Encounter: Payer: Self-pay | Admitting: Family Medicine

## 2021-03-25 DIAGNOSIS — I700 Atherosclerosis of aorta: Secondary | ICD-10-CM | POA: Insufficient documentation

## 2021-04-14 NOTE — Progress Notes (Signed)
Phone [phone] In person visit   Subjective:   Mark [name] is a 71 y.o. year old very pleasant male patient who presents for/with See problem oriented charting Chief Complaint  Patient presents with   Follow-up   Hypertension   Hyperlipidemia   Asthma   Anxiety   Gastroesophageal Reflux   This visit occurred during the SARS-CoV-2 public health emergency.  Safety protocols were in place, including screening questions prior to the visit, additional usage of staff PPE, and extensive cleaning of exam room while observing appropriate contact time as indicated for disinfecting solutions.   Past Medical History-  Patient Active Problem List   Diagnosis Date Noted   Asbestosis ([place]) 02/05/2016    Priority: High   Back pain 10/09/2013    Priority: High   Anxiety state 09/01/2009    Priority: High   Migraine headache 03/07/2007    Priority: High   Aortic atherosclerosis ([place]) 03/25/2021    Priority: Medium    Gastroparesis 05/01/2009    Priority: Medium    GERD 04/02/2009    Priority: Medium    Hyperlipidemia 06/11/2007    Priority: Medium    Asthma 03/12/2007    Priority: Medium    Essential hypertension 03/07/2007    Priority: Medium    PROSTATE CANCER, HX OF 03/07/2007    Priority: Medium    Allergic rhinitis 11/22/2013    Priority: Low   Overweight 01/10/2012    Priority: Low   HIATAL HERNIA 04/28/2009    Priority: Low   History of adenomatous polyp of colon 09/08/2017    Medications- reviewed and updated Current Outpatient Medications  Medication Sig Dispense Refill   albuterol (PROAIR HFA) 108 (90 Base) MCG/ACT inhaler INHALE 2 PUFFS EVERY 6 HOURS AS NEEDED FOR WHEEZING OR SHORTNESS OF BREATH 76.5 g 3   amLODipine (NORVASC) 2.5 MG tablet TAKE 1 TABLET BY MOUTH EVERY DAY 90 tablet 1   atorvastatin (LIPITOR) 20 MG tablet TAKE 1 TABLET (20 MG TOTAL) BY MOUTH DAILY. PT IS TAKING AT BEDTIME 90 tablet 3   Azelastine HCl 137 MCG/SPRAY SOLN PLACE 2 SPRAYS  INTO EACH NOSTRIL DAILY. 30 mL 3   butalbital-acetaminophen-caffeine (FIORICET) 50-325-40 MG tablet TAKE 1 TABLET BY MOUTH DAILY AS NEEDED- max 2 per week. 1 refill per month. 10 tablet 5   cyclobenzaprine (FLEXERIL) 10 MG tablet TAKE 0.5-1 TABLETS BY MOUTH 3 (THREE) TIMES DAILY AS NEEDED FOR MUSCLE SPASMS. 30 tablet 5   fexofenadine (ALLEGRA) 180 MG tablet Patient is taking 2 times daily 90 tablet 1   fluticasone (FLONASE) 50 MCG/ACT nasal spray USE ONE SPRAY IN EACH NOSTRIL AS DIRECTED ONCE DAILY 48 mL 3   gabapentin (NEURONTIN) 300 MG capsule Take 3 capsules (900 mg total) by mouth at bedtime. 30 capsule 1   hydrochlorothiazide (HYDRODIURIL) 25 MG tablet TAKE 1 TABLET BY MOUTH TWICE A DAY 180 tablet 3   montelukast (SINGULAIR) 10 MG tablet TAKE 1 TABLET BY MOUTH EVERYDAY AT BEDTIME 90 tablet 3   omeprazole (PRILOSEC) 40 MG capsule TAKE 1 CAPSULE (40 MG TOTAL) BY MOUTH DAILY BEFORE A MEAL. 90 capsule 3   ondansetron (ZOFRAN) 4 MG tablet TAKE 1 TABLET BY MOUTH EVERY 8 HOURS AS NEEDED FOR NAUSEA AND VOMITING 30 tablet 3   PARoxetine (PAXIL) 20 MG tablet TAKE 1 TABLET BY MOUTH EVERYDAY AT BEDTIME 90 tablet 1   promethazine (PHENERGAN) 12.5 MG tablet Take 1 tablet (12.5 mg total) by mouth daily. As needed 30 tablet 3  propranolol (INDERAL) 40 MG tablet TAKE 1 TABLET BY MOUTH TWICE A DAY 180 tablet 3   SYMBICORT 160-4.5 MCG/ACT inhaler TAKE 2 PUFFS BY MOUTH TWICE A DAY 10.2 each 11   traMADol (ULTRAM) 50 MG tablet Take 1 tablet (50 mg total) by mouth daily as needed. 30 tablet 2   VESICARE 5 MG tablet Take 1 tablet by mouth daily.     vitamin B-12 (CYANOCOBALAMIN) 500 MCG tablet Take 500 mcg by mouth daily.     No current facility-administered medications for this visit.     Objective:  BP 104/72    Pulse 60    Temp (!) 97.5 F (36.4 C)    Ht 5\' 10"  (1.778 m)    Wt 203 lb 9.6 oz (92.4 kg)    SpO2 97%    BMI 29.21 kg/m  Gen: NAD, resting comfortably CV: RRR no murmurs rubs or gallops Lungs:  CTAB no crackles, wheeze, rhonchi Abdomen: soft/nontender/nondistended/normal bowel sounds. No rebound or guarding.  Ext: no edema Skin: warm, dry, 1 x 1 cm epidermoid cyst on thoracic back midline- mobile- mildly tender but not erythematous/warm    Assessment and Plan   #sebaceous/epidermoid cyst on his mid back- we discussed possible dermatology referral but he wants to hold off for now- at times itches- discussed could use hydrocortisone 1% over the counter twice a day when it does and if doesn't calm down we can also refer at that time. Occasionally these get inflamed/infected and need to be drain- let us know if enlarges/bothers you more  #Asbestosis class II-follows with Dr. Charlett Blake #Asthma #allergies S: Medication: Symbicort for maintenance in addition to Singulair 10 mg , albuterol as needed - twice a day unfortunately lately. Already uses cool mist humidifier -Patient reports more cough lately when out in the cold air more- feels like more related to his throat -still taking allegra and flonase and astelin A/P:   allergies seems to be triggering cough. Suggested adding nasal saline rinses like neil med sinus rinse- make sure to follow instructions with water and use 1-2x a day and if not improving can refer to allergy/asthma -considered advancing inhaler -later in visit he noted he may be missing some Symbicort- encouraged him to be more consistent.   Outside of allergy related cough- asthma seems to be doing well. I do not think his symptoms are from asbestosis- he is following closely with Dr. Pearlie Oyster.   #History of prostate cancer-robotic prostatectomy with Dr. Alinda Money in the past. Continues to follow with urology last visit july 2022 -For overactive bladder patient is on [place] -Current plan is for annual surveillance  #hypertension S: medication: Propanolol 40mg  twice daily, hydrochlorothiazide 25 mg twice daily and  amlodipine 2.5Mg  every day - no  lightheadedness/dizziness- he will let us know BP Readings from Last 3 Encounters:  04/26/21 104/72  04/16/21 124/78  02/05/21 122/64  A/P: Excellent control today-continue current medication.  We will consider reducing hydrochlorothiazide to once daily  #hyperlipidemia-LDL has been below 70 with aortic atherosclerosis S: Medication:Atorvastatin 20Mg  daily Lab Results  Component Value Date   CHOL 111 10/21/2020   HDL 39.00 (L) 10/21/2020   LDLCALC 50 10/21/2020   LDLDIRECT 75.0 08/07/2015   TRIG 107.0 10/21/2020   CHOLHDL 3 10/21/2020   A/P: Excellent control on last check-continue current medication and recheck at 85-month visit  Patient with aortic atherosclerosis-continue current medication for risk factor modification and LDL goal under 70  # Anxiety S:Medication: Paxil 20 mg Suicidal ideation:  None Reports reasonable control A/P:  Controlled. Continue current medications.    #Chronic back pain-in the past patient was on Percocet but later weaned to Mobic and Valium and gabapentin and tramadol -Currently on Flexeril 10 mg up to 3 times a day as needed, gabapentin 900 mg at bedtime, tramadol daily as needed -has been careful with his activities and that seems to help -He does need a tramadol refill today- provided   #Migraines S: Prophylaxis: Propranolol for blood pressure control but also likely helps reduce frequency of migraines. -Fioricet uses typically varies -lately but usually twice a week A/P:  Controlled. Continue current medications.    # GERD S:Medication: Prilosec 40Mg  daily. Rare poor control intake second dose  --Endoscopy 04/08/2020 showed esophageal candidiasis and he was treated at that time B12 levels related to PPI use: Lab Results  Component Value Date   VITAMINB12 538 10/21/2020  A/P: Considering occasionally has to use second dose do not think can reduce medication-continue Prilosec 40 mg daily with reasonable control  #incidental left renal cyst-  ordered ultrasound on 02/04/21 based on CT from [facility]/Dr. Pearlie Oyster about asbestosis-from result note ". Benign Bosniak II upper pole left renal cyst. No finding in the lower pole left kidney to correspond to the questioned abnormality on recent ultrasound."  On 03/24/2021  Recommended follow up: No follow-ups on file. Future Appointments  Date Time Provider [place]  04/28/2022  8:00 AM [facility]ALTH COACH [facility]    Lab/Order associations:   ICD-10-CM   1. Essential hypertension  I10     2. Hyperlipidemia, unspecified hyperlipidemia type  E78.5     3. Gastroesophageal reflux disease with esophagitis without hemorrhage  K21.00     4. Migraine with aura and without status migrainosus, not intractable  G43.109     5. Anxiety state  F41.1     6. Asbestosis ([place])  J61     7. Aortic atherosclerosis ([facility])  I70.0       No orders of the defined types were placed in this encounter.   I,Jada Bradford,acting as a scribe for Garret Reddish, MD.,have documented all relevant documentation on the behalf of Garret Reddish, MD,as directed by  Garret Reddish, MD while in the presence of Garret Reddish, MD.   I, Garret Reddish, MD, have reviewed all documentation for this visit. The documentation on 04/26/21 for the exam, diagnosis, procedures, and orders are all accurate and complete.   Return precautions advised.  Garret Reddish, MD

## 2021-04-16 ENCOUNTER — Ambulatory Visit (INDEPENDENT_AMBULATORY_CARE_PROVIDER_SITE_OTHER): Payer: Medicare HMO

## 2021-04-16 ENCOUNTER — Other Ambulatory Visit: Payer: Self-pay

## 2021-04-16 VITALS — BP 124/78 | HR 78 | Temp 98.3°F | Wt 207.2 lb

## 2021-04-16 DIAGNOSIS — Z0000 Encounter for general adult medical examination without abnormal findings: Secondary | ICD-10-CM

## 2021-04-16 NOTE — Patient Instructions (Signed)
[name] , Thank you for taking time to come for your Medicare Wellness Visit. I appreciate your ongoing commitment to your health goals. Please review the following plan we discussed and let me know if I can assist you in the future.   Screening recommendations/referrals: Colonoscopy: Done 12/17/20 repeat every 3 years  Recommended yearly ophthalmology/optometry visit for glaucoma screening and checkup Recommended yearly dental visit for hygiene and checkup  Vaccinations: Influenza vaccine: Done 12/03/20 repeat every year  Pneumococcal vaccine: Up to date Tdap vaccine: Done 08/27/18 repeat every 10 years  Shingles vaccine: Shingrix discussed. Please contact your pharmacy for coverage information.    Covid-19: Completed 1/23,. 2/14, 11/28/19 & 12/03/20  Advanced directives: Copies in chart   Conditions/risks identified: Lose weight   Next appointment: Follow up in one year for your annual wellness visit.   Preventive Care 17 Years and Older, Male Preventive care refers to lifestyle choices and visits with your health care provider that can promote health and wellness. What does preventive care include? A yearly physical exam. This is also called an annual well check. Dental exams once or twice a year. Routine eye exams. Ask your health care provider how often you should have your eyes checked. Personal lifestyle choices, including: Daily care of your teeth and gums. Regular physical activity. Eating a healthy diet. Avoiding tobacco and drug use. Limiting alcohol use. Practicing safe sex. Taking low doses of aspirin every day. Taking vitamin and mineral supplements as recommended by your health care provider. What happens during an annual well check? The services and screenings done by your health care provider during your annual well check will depend on your age, overall health, lifestyle risk factors, and family history of disease. Counseling  Your health care provider may ask  you questions about your: Alcohol use. Tobacco use. Drug use. Emotional well-being. Home and relationship well-being. Sexual activity. Eating habits. History of falls. Memory and ability to understand (cognition). Work and work Statistician. Screening  You may have the following tests or measurements: Height, weight, and BMI. Blood pressure. Lipid and cholesterol levels. These may be checked every 5 years, or more frequently if you are over 70 years old. Skin check. Lung cancer screening. You may have this screening every year starting at age 70 if you have a 30-pack-year history of smoking and currently smoke or have quit within the past 15 years. Fecal occult blood test (FOBT) of the stool. You may have this test every year starting at age 70. Flexible sigmoidoscopy or colonoscopy. You may have a sigmoidoscopy every 5 years or a colonoscopy every 10 years starting at age 70. Prostate cancer screening. Recommendations will vary depending on your family history and other risks. Hepatitis C blood test. Hepatitis B blood test. Sexually transmitted disease (STD) testing. Diabetes screening. This is done by checking your blood sugar (glucose) after you have not eaten for a while (fasting). You may have this done every 1-3 years. Abdominal aortic aneurysm (AAA) screening. You may need this if you are a current or former smoker. Osteoporosis. You may be screened starting at age 70 if you are at high risk. Talk with your health care provider about your test results, treatment options, and if necessary, the need for more tests. Vaccines  Your health care provider may recommend certain vaccines, such as: Influenza vaccine. This is recommended every year. Tetanus, diphtheria, and acellular pertussis (Tdap, Td) vaccine. You may need a Td booster every 10 years. Zoster vaccine. You may need this  after age 70. Pneumococcal 13-valent conjugate (PCV13) vaccine. One dose is recommended after age  70. Pneumococcal polysaccharide (PPSV23) vaccine. One dose is recommended after age 70. Talk to your health care provider about which screenings and vaccines you need and how often you need them. This information is not intended to replace advice given to you by your health care provider. Make sure you discuss any questions you have with your health care provider. Document Released: 03/13/2015 Document Revised: 11/04/2015 Document Reviewed: 12/16/2014 Elsevier Interactive Patient Education  2017 [place] Prevention in the Home Falls can cause injuries. They can happen to people of all ages. There are many things you can do to make your home safe and to help prevent falls. What can I do on the outside of my home? Regularly fix the edges of walkways and driveways and fix any cracks. Remove anything that might make you trip as you walk through a door, such as a raised step or threshold. Trim any bushes or trees on the path to your home. Use bright outdoor lighting. Clear any walking paths of anything that might make someone trip, such as rocks or tools. Regularly check to see if handrails are loose or broken. Make sure that both sides of any steps have handrails. Any raised decks and porches should have guardrails on the edges. Have any leaves, snow, or ice cleared regularly. Use sand or salt on walking paths during winter. Clean up any spills in your garage right away. This includes oil or grease spills. What can I do in the bathroom? Use night lights. Install grab bars by the toilet and in the tub and shower. Do not use towel bars as grab bars. Use non-skid mats or decals in the tub or shower. If you need to sit down in the shower, use a plastic, non-slip stool. Keep the floor dry. Clean up any water that spills on the floor as soon as it happens. Remove soap buildup in the tub or shower regularly. Attach bath mats securely with double-sided non-slip rug tape. Do not have throw  rugs and other things on the floor that can make you trip. What can I do in the bedroom? Use night lights. Make sure that you have a light by your bed that is easy to reach. Do not use any sheets or blankets that are too big for your bed. They should not hang down onto the floor. Have a firm chair that has side arms. You can use this for support while you get dressed. Do not have throw rugs and other things on the floor that can make you trip. What can I do in the kitchen? Clean up any spills right away. Avoid walking on wet floors. Keep items that you use a lot in easy-to-reach places. If you need to reach something above you, use a strong step stool that has a grab bar. Keep electrical cords out of the way. Do not use floor polish or wax that makes floors slippery. If you must use wax, use non-skid floor wax. Do not have throw rugs and other things on the floor that can make you trip. What can I do with my stairs? Do not leave any items on the stairs. Make sure that there are handrails on both sides of the stairs and use them. Fix handrails that are broken or loose. Make sure that handrails are as long as the stairways. Check any carpeting to make sure that it is firmly attached to the  stairs. Fix any carpet that is loose or worn. Avoid having throw rugs at the top or bottom of the stairs. If you do have throw rugs, attach them to the floor with carpet tape. Make sure that you have a light switch at the top of the stairs and the bottom of the stairs. If you do not have them, ask someone to add them for you. What else can I do to help prevent falls? Wear shoes that: Do not have high heels. Have rubber bottoms. Are comfortable and fit you well. Are closed at the toe. Do not wear sandals. If you use a stepladder: Make sure that it is fully opened. Do not climb a closed stepladder. Make sure that both sides of the stepladder are locked into place. Ask someone to hold it for you, if  possible. Clearly mark and make sure that you can see: Any grab bars or handrails. First and last steps. Where the edge of each step is. Use tools that help you move around (mobility aids) if they are needed. These include: Canes. Walkers. Scooters. Crutches. Turn on the lights when you go into a dark area. Replace any light bulbs as soon as they burn out. Set up your furniture so you have a clear path. Avoid moving your furniture around. If any of your floors are uneven, fix them. If there are any pets around you, be aware of where they are. Review your medicines with your doctor. Some medicines can make you feel dizzy. This can increase your chance of falling. Ask your doctor what other things that you can do to help prevent falls. This information is not intended to replace advice given to you by your health care provider. Make sure you discuss any questions you have with your health care provider. Document Released: 12/11/2008 Document Revised: 07/23/2015 Document Reviewed: 03/21/2014 Elsevier Interactive Patient Education  2017 [employer].

## 2021-04-16 NOTE — Progress Notes (Signed)
Subjective:   Mark [name] is a 71 y.o. male who presents for Medicare Annual/Subsequent preventive examination.  Review of Systems     Cardiac Risk Factors include: dyslipidemia;hypertension;male gender;advanced age (>38men, >73 women)     Objective:    Today's Vitals   04/16/21 0837  BP: 124/78  Pulse: 78  Temp: 98.3 F (36.8 C)  SpO2: 92%  Weight: 207 lb 3.2 oz (94 kg)   Body mass index is 29.73 kg/m.  Advanced Directives 04/16/2021 04/10/2020 03/11/2019 09/19/2017 08/21/2017 10/10/2013 10/09/2013  Does Patient Have a Medical Advance Directive? Yes Yes Yes Yes No No Patient would not like information;Patient does not have advance directive  Type of Advance Directive Healthcare Power of [place];Living will - - -  Does patient want to make changes to medical advance directive? - - No - Patient declined No - Patient declined - - -  Copy of [place] in Chart? Yes - validated most recent copy scanned in chart (See row information) Yes - validated most recent copy scanned in chart (See row information) No - copy requested No - copy requested - - -  Would patient like information on creating a medical advance directive? - - - - - No - patient declined information -  Pre-existing out of facility DNR order (yellow form or pink MOST form) - - - - - - -    Current Medications (verified) Outpatient Encounter Medications as of 04/16/2021  Medication Sig   albuterol (PROAIR HFA) 108 (90 Base) MCG/ACT inhaler INHALE 2 PUFFS EVERY 6 HOURS AS NEEDED FOR WHEEZING OR SHORTNESS OF BREATH   amLODipine (NORVASC) 2.5 MG tablet TAKE 1 TABLET BY MOUTH EVERY DAY   atorvastatin (LIPITOR) 20 MG tablet TAKE 1 TABLET (20 MG TOTAL) BY MOUTH DAILY. PT IS TAKING AT BEDTIME   Azelastine HCl 137 MCG/SPRAY SOLN PLACE 2 SPRAYS INTO EACH NOSTRIL DAILY.   butalbital-acetaminophen-caffeine  (FIORICET) 50-325-40 MG tablet TAKE 1 TABLET BY MOUTH DAILY AS NEEDED- max 2 per week. 1 refill per month.   cyclobenzaprine (FLEXERIL) 10 MG tablet TAKE 0.5-1 TABLETS BY MOUTH 3 (THREE) TIMES DAILY AS NEEDED FOR MUSCLE SPASMS.   fexofenadine (ALLEGRA) 180 MG tablet Patient is taking 2 times daily   fluticasone (FLONASE) 50 MCG/ACT nasal spray USE ONE SPRAY IN EACH NOSTRIL AS DIRECTED ONCE DAILY   gabapentin (NEURONTIN) 300 MG capsule Take 3 capsules (900 mg total) by mouth at bedtime.   hydrochlorothiazide (HYDRODIURIL) 25 MG tablet TAKE 1 TABLET BY MOUTH TWICE A DAY   montelukast (SINGULAIR) 10 MG tablet TAKE 1 TABLET BY MOUTH EVERYDAY AT BEDTIME   omeprazole (PRILOSEC) 40 MG capsule TAKE 1 CAPSULE (40 MG TOTAL) BY MOUTH DAILY BEFORE A MEAL.   ondansetron (ZOFRAN) 4 MG tablet TAKE 1 TABLET BY MOUTH EVERY 8 HOURS AS NEEDED FOR NAUSEA AND VOMITING   PARoxetine (PAXIL) 20 MG tablet TAKE 1 TABLET BY MOUTH EVERYDAY AT BEDTIME   promethazine (PHENERGAN) 12.5 MG tablet Take 1 tablet (12.5 mg total) by mouth daily. As needed   propranolol (INDERAL) 40 MG tablet TAKE 1 TABLET BY MOUTH TWICE A DAY   SYMBICORT 160-4.5 MCG/ACT inhaler TAKE 2 PUFFS BY MOUTH TWICE A DAY   traMADol (ULTRAM) 50 MG tablet Take 1 tablet (50 mg total) by mouth daily as needed.   VESICARE 5 MG tablet Take 1 tablet by mouth daily.   vitamin B-12 (CYANOCOBALAMIN)  500 MCG tablet Take 500 mcg by mouth daily.   No facility-administered encounter medications on file as of 04/16/2021.    Allergies (verified) Propoxyphene hcl   History: Past Medical History:  Diagnosis Date   Allergy    seasonal   Anxiety state, unspecified 09/01/2009   ASTHMA 03/12/2007   Asthma    Cancer ([place]) 2008   prostate cancer   Gastroparesis 05/01/2009   GERD 04/02/2009   Headache(784.0) 03/07/2007   HIATAL HERNIA 04/28/2009   Hiatal hernia    HYPERLIPIDEMIA 06/11/2007   HYPERTENSION 03/07/2007   Pneumonia    hx of   Polyp of colon, hyperplastic     PROSTATE CANCER, HX OF 03/07/2007   Suspected exposure to asbestos    have asbestosis testing every year   TRANSAMINASES, SERUM, ELEVATED 04/28/2009   Tubular adenoma of colon    Ulcer    as teenager   Past Surgical History:  Procedure Laterality Date   APPENDECTOMY     BACK SURGERY  05/2011   COLONOSCOPY     LUMBAR LAMINECTOMY/DECOMPRESSION MICRODISCECTOMY N/A 10/09/2013   Procedure: L4-5 DECOMPRESSION/FARAMINOTOMY REVISION ;  Surgeon: Melina Schools, MD;  Location: [facility];  Service: Orthopedics;  Laterality: N/A;   PROSTATECTOMY  07/10/06   ROTATOR CUFF REPAIR     9/09 on right   TRANSFORAMINAL LUMBAR INTERBODY FUSION (TLIF) WITH PEDICLE SCREW FIXATION 1 LEVEL N/A 10/09/2013   Procedure: TLIF L5-S1;  Surgeon: Melina Schools, MD;  Location: [facility];  Service: Orthopedics;  Laterality: N/A;   Family History  Problem Relation Age of Onset   Pancreatic cancer Mother 6   Hypertension Father    Heart attack Father        85   Hypertension Brother        x 2   Hypertension Brother    Kidney disease Brother    Hypertension Brother    Multiple sclerosis Sister    Heart disease Sister    Heart attack Sister    Hypertension Son    Heart attack Maternal Grandmother    Heart attack Paternal Grandmother    Heart attack Paternal Grandfather    Colon cancer Neg Hx    Rectal cancer Neg Hx    Stomach cancer Neg Hx    Esophageal cancer Neg Hx    Social History   Socioeconomic History   Marital status: Married    Spouse name: Not on file   Number of children: 4   Years of education: Not on file   Highest education level: Not on file  Occupational History   Occupation: Disabled  Tobacco Use   Smoking status: Former    Packs/day: 0.50    Years: 2.00    Pack years: 1.00    Types: Cigarettes    Quit date: 04/09/1983    Years since quitting: 38.0   Smokeless tobacco: Never  Vaping Use   Vaping Use: Never used  Substance and Sexual Activity   Alcohol use: No    Alcohol/week: 0.0  standard drinks   Drug use: No   Sexual activity: Yes  Other Topics Concern   Not on file  Social History Narrative   Married 1974. Wife-Helen. 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       Retired from [employer]: hunting, boat (  off for 1 year in 2015)   Social Determinants of Health   Financial Resource Strain: Low Risk    Difficulty of Paying Living Expenses: Not hard at all  Food Insecurity: No Food Insecurity   Worried About Charity fundraiser in the Last Year: Never true   Arboriculturist in the Last Year: Never true  Transportation Needs: No Transportation Needs   Lack of Transportation (Medical): No   Lack of Transportation (Non-Medical): No  Physical Activity: Insufficiently Active   Days of Exercise per Week: 5 days   Minutes of Exercise per Session: 10 min  Stress: No Stress Concern Present   Feeling of Stress : Not at all  Social Connections: Socially Integrated   Frequency of Communication with Friends and Family: More than three times a week   Frequency of Social Gatherings with Friends and Family: More than three times a week   Attends Religious Services: More than 4 times per year   Active Member of [employer] or Organizations: Yes   Attends Archivist Meetings: 1 to 4 times per year   Marital Status: Married    Tobacco Counseling Counseling given: Not Answered   Clinical Intake:  Pre-visit preparation completed: Yes  Pain : No/denies pain     BMI - recorded: 29.78 Nutritional Status: BMI 25 -29 Overweight Nutritional Risks: None Diabetes: No  How often do you need to have someone help you when you read instructions, pamphlets, or other written materials from your doctor or pharmacy?: 1 - Never  Diabetic?no  Interpreter Needed?: No  Information entered by :: Charlott Rakes,  LPN   Activities of Daily Living In your present state of health, do you have any difficulty performing the following activities: 04/16/2021  Hearing? N  Vision? N  Difficulty concentrating or making decisions? N  Walking or climbing stairs? N  Dressing or bathing? N  Doing errands, shopping? N  Preparing Food and eating ? N  Using the Toilet? N  In the past six months, have you accidently leaked urine? N  Do you have problems with loss of bowel control? N  Managing your Medications? N  Managing your Finances? N  Housekeeping or managing your Housekeeping? N  Some recent data might be hidden    Patient Care Team: Marin Olp, MD as PCP - General (Family Medicine) Mauri Pole, MD as Consulting Physician (Gastroenterology) Raynelle Bring, MD as Consulting Physician (Urology)  Indicate any recent Medical Services you may have received from other than [facility] providers in the past year (date may be approximate).     Assessment:   This is a routine wellness examination for [place].  Hearing/Vision screen Hearing Screening - Comments:: Pt denies any hearing issues  Vision Screening - Comments:: Pt follows up with [facility] for annual eye exams   Dietary issues and exercise activities discussed: Current Exercise Habits: Home exercise routine, Type of exercise: Other - see comments (recumbant bike), Time (Minutes): 15, Frequency (Times/Week): 5, Weekly Exercise (Minutes/Week): 75   Goals Addressed             This Visit's Progress    Patient Stated       Patient Stated       Get weight back down        Depression Screen PHQ 2/9 Scores 04/16/2021 10/21/2020 04/10/2020 10/22/2019 04/23/2019 03/11/2019 12/20/2017  PHQ - 2 Score 0 0 0 0 0 0 0  PHQ- 9 Score - - -  0 0 - 0    Fall Risk Fall Risk  04/16/2021 10/21/2020 04/10/2020 10/22/2019 03/11/2019  Falls in the past year? 0 0 0 0 0  Number falls in past yr: 0 0 0 0 -  Injury with Fall? 0 0 0 0 0  Risk for fall  due to : Impaired vision No Fall Risks Impaired vision - -  Follow up Falls prevention discussed Falls evaluation completed Falls prevention discussed - Falls evaluation completed;Education provided;Falls prevention discussed    FALL RISK PREVENTION PERTAINING TO THE HOME:  Any stairs in or around the home? Yes  If so, are there any without handrails? No  Home free of loose throw rugs in walkways, pet beds, electrical cords, etc? Yes  Adequate lighting in your home to reduce risk of falls? Yes   ASSISTIVE DEVICES UTILIZED TO PREVENT FALLS:  Life alert? No  Use of a cane, walker or w/c? Yes  Grab bars in the bathroom? Yes  Shower chair or bench in shower? Yes  Elevated toilet seat or a handicapped toilet? No   TIMED UP AND GO:  Was the test performed? Yes .  Length of time to ambulate 10 feet: 10 sec.   Gait steady and fast without use of assistive device  Cognitive Function:     6CIT Screen 04/16/2021 04/10/2020 09/19/2017  What Year? 0 points 0 points 0 points  What month? 0 points 0 points 0 points  What time? 0 points - 0 points  Count back from 20 0 points 0 points 0 points  Months in reverse 0 points 0 points 0 points  Repeat phrase 2 points 4 points -  Total Score 2 - -    Immunizations Immunization History  Administered Date(s) Administered   Fluad Quad(high Dose 65+) 10/22/2018, 01/14/2020   H1N1 03/21/2008   Influenza Split 12/31/2010, 11/22/2011, 11/28/2012   Influenza Whole 12/12/2007, 12/11/2008   Influenza, High Dose Seasonal PF 12/07/2016, 12/20/2017, 12/03/2020   Influenza,inj,Quad PF,6+ Mos 12/12/2014, 12/31/2015   PFIZER(Purple Top)SARS-COV-2 Vaccination 03/23/2019, 04/14/2019, 11/28/2019   Pfizer Covid-19 Vaccine Bivalent Booster 71yrs & up 12/03/2020   Pneumococcal Conjugate-13 12/07/2016   Pneumococcal Polysaccharide-23 12/20/2017   Td 03/01/1995, 06/17/2008   Tdap 06/11/2007, 08/27/2018   Zoster, Live 01/10/2012    TDAP status: Up to  date  Flu Vaccine status: Up to date  Pneumococcal vaccine status: Up to date  Covid-19 vaccine status: Completed vaccines  Qualifies for Shingles Vaccine? Yes   Zostavax completed No   Shingrix Completed?: No.    Education has been provided regarding the importance of this vaccine. Patient has been advised to call insurance company to determine out of pocket expense if they have not yet received this vaccine. Advised may also receive vaccine at local pharmacy or Health Dept. Verbalized acceptance and understanding.  Screening Tests Health Maintenance  Topic Date Due   Zoster Vaccines- Shingrix (1 of 2) Never done   COLONOSCOPY (Pts 45-72yrs Insurance coverage will need to be confirmed)  12/18/2023   TETANUS/TDAP  08/26/2028   Pneumonia Vaccine 81+ Years old  Completed   INFLUENZA VACCINE  Completed   COVID-19 Vaccine  Completed   Hepatitis C Screening  Completed   HPV VACCINES  Aged Out    Health Maintenance  Health Maintenance Due  Topic Date Due   Zoster Vaccines- Shingrix (1 of 2) Never done    Colorectal cancer screening: Type of screening: Colonoscopy. Completed 12/17/20. Repeat every 3 years    Additional Screening:  Hepatitis C Screening:  Completed 03/24/09  Vision Screening: Recommended annual ophthalmology exams for early detection of glaucoma and other disorders of the eye. Is the patient up to date with their annual eye exam?  Yes  Who is the provider or what is the name of the office in which the patient attends annual eye exams? [facility]  If pt is not established with a provider, would they like to be referred to a provider to establish care? No .   Dental Screening: Recommended annual dental exams for proper oral hygiene  Community Resource Referral / Chronic Care Management: CRR required this visit?  No   CCM required this visit?  No      Plan:     I have personally reviewed and noted the following in the patients chart:   Medical and social  history Use of alcohol, tobacco or illicit drugs  Current medications and supplements including opioid prescriptions. Patient is currently taking opioid prescriptions. Information provided to patient regarding non-opioid alternatives. Patient advised to discuss non-opioid treatment plan with their provider. Functional ability and status Nutritional status Physical activity Advanced directives List of other physicians Hospitalizations, surgeries, and ER visits in previous 12 months Vitals Screenings to include cognitive, depression, and falls Referrals and appointments  In addition, I have reviewed and discussed with patient certain preventive protocols, quality metrics, and best practice recommendations. A written personalized care plan for preventive services as well as general preventive health recommendations were provided to patient.     Willette Brace, LPN   [id number]   Nurse Notes: pt stated he would like to discuss CT scan results  and a area on his back from a tick bite that is still itching at appt 04/26/21

## 2021-04-26 ENCOUNTER — Encounter: Payer: Self-pay | Admitting: Family Medicine

## 2021-04-26 ENCOUNTER — Other Ambulatory Visit: Payer: Self-pay

## 2021-04-26 ENCOUNTER — Ambulatory Visit (INDEPENDENT_AMBULATORY_CARE_PROVIDER_SITE_OTHER): Payer: Medicare HMO | Admitting: Family Medicine

## 2021-04-26 VITALS — BP 104/72 | HR 60 | Temp 97.5°F | Ht 70.0 in | Wt 203.6 lb

## 2021-04-26 DIAGNOSIS — I700 Atherosclerosis of aorta: Secondary | ICD-10-CM

## 2021-04-26 DIAGNOSIS — I10 Essential (primary) hypertension: Secondary | ICD-10-CM | POA: Diagnosis not present

## 2021-04-26 DIAGNOSIS — K2100 Gastro-esophageal reflux disease with esophagitis, without bleeding: Secondary | ICD-10-CM | POA: Diagnosis not present

## 2021-04-26 DIAGNOSIS — J61 Pneumoconiosis due to asbestos and other mineral fibers: Secondary | ICD-10-CM

## 2021-04-26 DIAGNOSIS — E785 Hyperlipidemia, unspecified: Secondary | ICD-10-CM | POA: Diagnosis not present

## 2021-04-26 DIAGNOSIS — F411 Generalized anxiety disorder: Secondary | ICD-10-CM

## 2021-04-26 DIAGNOSIS — G43109 Migraine with aura, not intractable, without status migrainosus: Secondary | ICD-10-CM | POA: Diagnosis not present

## 2021-04-26 DIAGNOSIS — R69 Illness, unspecified: Secondary | ICD-10-CM | POA: Diagnosis not present

## 2021-04-26 MED ORDER — TRAMADOL HCL 50 MG PO TABS: 50.0000 mg | ORAL_TABLET | Freq: Every day | ORAL | 2 refills | Status: DC | PRN

## 2021-04-26 NOTE — Patient Instructions (Addendum)
Let us know when you get your Shingles Vaccine at pharmacy  #allergies seems to be triggering cough. Suggested adding nasal saline rinses like neil med sinus rinse- make sure to follow instructions with water and use 1-2x a day and if not improving can refer to allergy/asthma  #sebaceous/epidermoid cyst on his mid back- we discussed possible dermatology referral but he wants to hold off for now- at times itches- discussed could use hydrocortisone 1% over the counter twice a day when it does and if doesn't calm down we can also refer at that time. Occasionally these get inflamed/infected and need to be drain- let us know if enlarges/bothers you more  Recommended follow up: Return in about 6 months (around 10/24/2021) for physical or sooner if needed.  -bloodwork next visit

## 2021-09-15 DIAGNOSIS — Z8546 Personal history of malignant neoplasm of prostate: Secondary | ICD-10-CM | POA: Diagnosis not present

## 2021-09-28 ENCOUNTER — Other Ambulatory Visit: Payer: Self-pay | Admitting: Family Medicine

## 2021-09-29 DIAGNOSIS — N5201 Erectile dysfunction due to arterial insufficiency: Secondary | ICD-10-CM | POA: Diagnosis not present

## 2021-09-29 DIAGNOSIS — R350 Frequency of micturition: Secondary | ICD-10-CM | POA: Diagnosis not present

## 2021-11-12 ENCOUNTER — Ambulatory Visit (INDEPENDENT_AMBULATORY_CARE_PROVIDER_SITE_OTHER): Payer: Medicare HMO | Admitting: Family Medicine

## 2021-11-12 ENCOUNTER — Encounter: Payer: Self-pay | Admitting: Family Medicine

## 2021-11-12 VITALS — BP 138/70 | HR 62 | Temp 97.7°F | Ht 70.0 in | Wt 200.8 lb

## 2021-11-12 DIAGNOSIS — Z8546 Personal history of malignant neoplasm of prostate: Secondary | ICD-10-CM

## 2021-11-12 DIAGNOSIS — Z23 Encounter for immunization: Secondary | ICD-10-CM

## 2021-11-12 DIAGNOSIS — Z0000 Encounter for general adult medical examination without abnormal findings: Secondary | ICD-10-CM

## 2021-11-12 DIAGNOSIS — E785 Hyperlipidemia, unspecified: Secondary | ICD-10-CM

## 2021-11-12 NOTE — Addendum Note (Signed)
Addended by: Loura Back on: 11/12/2021 03:56 PM   Modules accepted: Orders

## 2021-11-12 NOTE — Patient Instructions (Addendum)
Health Maintenance Due  Topic Date Due   Zoster Vaccines- Shingrix (1 of 2) Never done  Consider shingrix at pharmacy  Please stop by lab before you go If you have mychart- we will send your results within 3 business days of [place] receiving them.  If you do not have mychart- we will call you about results within 5 business days of [place] receiving them.  *please also note that you will see labs on mychart as soon as they post. I will later go in and write notes on them- will say "notes from Dr. Yong Channel"   Recommended follow up: Return in about 6 months (around 05/13/2022) for followup or sooner if needed.Schedule b4 you leave.

## 2021-11-12 NOTE — Progress Notes (Signed)
Phone: [phone]   Subjective:  Patient presents today for their annual physical. Chief complaint-noted.   See problem oriented charting- ROS- full  review of systems was completed and negative  except for: congestion, sore throat, allergies, cough, muscle aches, nausea  The following were reviewed and entered/updated in epic: Past Medical History:  Diagnosis Date   Allergy    seasonal   Anxiety state, unspecified 09/01/2009   ASTHMA 03/12/2007   Asthma    Cancer ([place]) 2008   prostate cancer   Gastroparesis 05/01/2009   GERD 04/02/2009   Headache(784.0) 03/07/2007   HIATAL HERNIA 04/28/2009   Hiatal hernia    HYPERLIPIDEMIA 06/11/2007   HYPERTENSION 03/07/2007   Pneumonia    hx of   Polyp of colon, hyperplastic    PROSTATE CANCER, HX OF 03/07/2007   Suspected exposure to asbestos    have asbestosis testing every year   TRANSAMINASES, SERUM, ELEVATED 04/28/2009   Tubular adenoma of colon    Ulcer    as teenager   Patient Active Problem List   Diagnosis Date Noted   Asbestosis ([place]) 02/05/2016    Priority: High   Back pain 10/09/2013    Priority: High   Anxiety state 09/01/2009    Priority: High   Migraine headache 03/07/2007    Priority: High   Aortic atherosclerosis ([place]) 03/25/2021    Priority: Medium    Gastroparesis 05/01/2009    Priority: Medium    GERD 04/02/2009    Priority: Medium    Hyperlipidemia 06/11/2007    Priority: Medium    Asthma 03/12/2007    Priority: Medium    Essential hypertension 03/07/2007    Priority: Medium    PROSTATE CANCER, HX OF 03/07/2007    Priority: Medium    Allergic rhinitis 11/22/2013    Priority: Low   Overweight 01/10/2012    Priority: Low   HIATAL HERNIA 04/28/2009    Priority: Low   History of adenomatous polyp of colon 09/08/2017   Past Surgical History:  Procedure Laterality Date   APPENDECTOMY     BACK SURGERY  05/2011   COLONOSCOPY     LUMBAR LAMINECTOMY/DECOMPRESSION MICRODISCECTOMY N/A 10/09/2013    Procedure: L4-5 DECOMPRESSION/FARAMINOTOMY REVISION ;  Surgeon: Melina Schools, MD;  Location: [facility];  Service: Orthopedics;  Laterality: N/A;   PROSTATECTOMY  07/10/06   ROTATOR CUFF REPAIR     9/09 on right   TRANSFORAMINAL LUMBAR INTERBODY FUSION (TLIF) WITH PEDICLE SCREW FIXATION 1 LEVEL N/A 10/09/2013   Procedure: TLIF L5-S1;  Surgeon: Melina Schools, MD;  Location: [facility];  Service: Orthopedics;  Laterality: N/A;    Family History  Problem Relation Age of Onset   Pancreatic cancer Mother 32   Hypertension Father    Heart attack Father        57   Multiple sclerosis Sister    Heart disease Sister    Heart attack Sister    Hypertension Brother        x 2   Hypertension Brother    Kidney disease Brother    Hypertension Brother    Heart attack Maternal Grandmother    Heart attack Paternal Grandmother    Heart attack Paternal Grandfather    Liver cancer Daughter    Hypertension Son    Colon cancer Neg Hx    Rectal cancer Neg Hx    Stomach cancer Neg Hx    Esophageal cancer Neg Hx     Medications- reviewed and updated Current Outpatient Medications  Medication  Sig Dispense Refill   albuterol (PROAIR HFA) 108 (90 Base) MCG/ACT inhaler INHALE 2 PUFFS EVERY 6 HOURS AS NEEDED FOR WHEEZING OR SHORTNESS OF BREATH 76.5 g 3   amLODipine (NORVASC) 2.5 MG tablet TAKE 1 TABLET BY MOUTH EVERY DAY 90 tablet 1   atorvastatin (LIPITOR) 20 MG tablet TAKE 1 TABLET (20 MG TOTAL) BY MOUTH DAILY. PT IS TAKING AT BEDTIME 90 tablet 3   Azelastine HCl 137 MCG/SPRAY SOLN PLACE 2 SPRAYS INTO EACH NOSTRIL DAILY. 30 mL 3   butalbital-acetaminophen-caffeine (FIORICET) 50-325-40 MG tablet TAKE 1 TABLET BY MOUTH DAILY AS NEEDED- max 2 per week. 1 refill per month. 10 tablet 5   cyclobenzaprine (FLEXERIL) 10 MG tablet TAKE 0.5-1 TABLETS BY MOUTH 3 (THREE) TIMES DAILY AS NEEDED FOR MUSCLE SPASMS. 30 tablet 5   fexofenadine (ALLEGRA) 180 MG tablet Patient is taking 2 times daily 90 tablet 1   fluticasone  (FLONASE) 50 MCG/ACT nasal spray USE ONE SPRAY IN EACH NOSTRIL AS DIRECTED ONCE DAILY 48 mL 3   gabapentin (NEURONTIN) 300 MG capsule Take 3 capsules (900 mg total) by mouth at bedtime. 30 capsule 1   hydrochlorothiazide (HYDRODIURIL) 25 MG tablet TAKE 1 TABLET BY MOUTH TWICE A DAY 180 tablet 3   montelukast (SINGULAIR) 10 MG tablet TAKE 1 TABLET BY MOUTH EVERYDAY AT BEDTIME 90 tablet 3   omeprazole (PRILOSEC) 40 MG capsule TAKE 1 CAPSULE (40 MG TOTAL) BY MOUTH DAILY BEFORE A MEAL. 90 capsule 3   ondansetron (ZOFRAN) 4 MG tablet TAKE 1 TABLET BY MOUTH EVERY 8 HOURS AS NEEDED FOR NAUSEA AND VOMITING 30 tablet 3   PARoxetine (PAXIL) 20 MG tablet TAKE 1 TABLET BY MOUTH EVERYDAY AT BEDTIME 90 tablet 1   promethazine (PHENERGAN) 12.5 MG tablet Take 1 tablet (12.5 mg total) by mouth daily. As needed 30 tablet 3   propranolol (INDERAL) 40 MG tablet TAKE 1 TABLET BY MOUTH TWICE A DAY 180 tablet 3   SYMBICORT 160-4.5 MCG/ACT inhaler TAKE 2 PUFFS BY MOUTH TWICE A DAY 10.2 each 11   traMADol (ULTRAM) 50 MG tablet Take 1 tablet (50 mg total) by mouth daily as needed. 30 tablet 2   VESICARE 5 MG tablet Take 1 tablet by mouth daily.     vitamin B-12 (CYANOCOBALAMIN) 500 MCG tablet Take 500 mcg by mouth daily.     No current facility-administered medications for this visit.    Allergies-reviewed and updated Allergies  Allergen Reactions   Propoxyphene Hcl Nausea And Vomiting         Social History   Social History Narrative   Married 1974. Wife-Helen. 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    Lost daughter Otila Kluver at age 29 in 2023- left behind 1 child that now lives with him      Retired from [employer]: hunting, boat (off for 1 year in 2015)   Objective  Objective:  BP 138/70   Pulse 62   Temp 97.7 F (36.5 C)   Ht '5\' 10"'$   (1.778 m)   Wt 200 lb 12.8 oz (91.1 kg)   SpO2 95%   BMI 28.81 kg/m  Gen: NAD, resting comfortably HEENT: Mucous membranes are moist. Oropharynx normal Neck: no thyromegaly CV: RRR no murmurs rubs or gallops Lungs: CTAB no crackles,  wheeze, rhonchi Abdomen: soft/nontender/nondistended/normal bowel sounds. No rebound or guarding.  Ext: no edema Skin: warm, dry Neuro: grossly normal, moves all extremities, PERRLA    Assessment and Plan  71 y.o. male presenting for annual physical.  Health Maintenance counseling: 1. Anticipatory guidance: Patient counseled regarding regular dental exams -q6 months, eye exams -yearly,  avoiding smoking and second hand smoke , limiting alcohol to 2 beverages per day , no illicit drugs .   2. Risk factor reduction:  Advised patient of need for regular exercise and diet rich and fruits and vegetables to reduce risk of heart attack and stroke.  Exercise- was swimming using his boat but working on some repairs- has a machine that he is going to try to use and has treadmill- goal 150 minutes a week. Is doing some self propel walkign Diet/weight management-down 7 lbs from last visit- encouraged healthy diet- drinks some diet sodas- discussed water instead Wt Readings from Last 3 Encounters:  11/12/21 200 lb 12.8 oz (91.1 kg)  04/26/21 203 lb 9.6 oz (92.4 kg)  04/16/21 207 lb 3.2 oz (94 kg)  3. Immunizations/screenings/ancillary studies - shingrix at pharmacy, mid October likely for covid shot, just had flu shot toda Immunization History  Administered Date(s) Administered   Fluad Quad(high Dose 65+) 10/22/2018, 01/14/2020, 11/12/2021   H1N1 03/21/2008   Influenza Split 12/31/2010, 11/22/2011, 11/28/2012   Influenza Whole 12/12/2007, 12/11/2008   Influenza, High Dose Seasonal PF 12/07/2016, 12/20/2017, 12/03/2020   Influenza,inj,Quad PF,6+ Mos 12/12/2014, 12/31/2015   PFIZER(Purple Top)SARS-COV-2 Vaccination 03/23/2019, 04/14/2019, 11/28/2019   Pfizer  Covid-19 Vaccine Bivalent Booster 36yr & up 12/03/2020   Pneumococcal Conjugate-13 12/07/2016   Pneumococcal Polysaccharide-23 12/20/2017   Td 03/01/1995, 06/17/2008   Tdap 06/11/2007, 08/27/2018   Zoster, Live 01/10/2012  4. Prostate cancer follow up- check psa with labs with history of prostate cancer- prior robotic prostatectomy - but actually just had in July with Dr. BAlinda Moneyand remained low Lab Results  Component Value Date   PSA 0.015 09/14/2020   PSA 0.015 09/12/2019   PSA 0.015 09/05/2017   5. Colon cancer screening - 12/17/20 with 3 year repeat planned 6. Skin cancer screening- lower risk due to melanin content. advised regular sunscreen use. Denies worrisome, changing, or new skin lesions.  7. Smoking associated screening (lung cancer screening, AAA screen 65-75, UA)- former smoker- quit 1985 and under 2 pack years. No aneurysm on 03/24/21 ct abdomen 8. STD screening -  only active with wife  Status of chronic or acute concerns   #social update- lost daughter in 2023 to liver cancer- caring for 927year old grandson now. genetic testing and was told would only have passed down to a daughter.   #Asbestosis class II-follows with Dr. SCharlett Blake will see in November/decmber #Asthma S: Medication: Symbicort for maintenance added August 2021 in addition to Singulair 10 mg, albuterol as needed - twice a week A/P: overall stable lung function - continue current meds  #History of prostate cancer-robotic prostatectomy with Dr. BAlinda Moneyin the past.  Continues to follow with urology  last visit july 2022 -For overactive bladder patient is on Vesicare  #hypertension S: medication: Propanolol '40mg'$  twice daily, hydrochlorothiazide 25 mg , amlodipine 2.'5Mg'$  -115/120 at home BP Readings from Last 3 Encounters:  11/12/21 138/70  04/26/21 104/72  04/16/21 124/78  A/P: high normal but acceptable- continue current meds  #hyperlipidemia-LDL has been below 70 S: Medication:Atorvastatin  '20Mg'$  Lab Results  Component Value Date   CHOL 111 10/21/2020  HDL 39.00 (L) 10/21/2020   LDLCALC 50 10/21/2020   LDLDIRECT 75.0 08/07/2015   TRIG 107.0 10/21/2020   CHOLHDL 3 10/21/2020   A/P: hopefully stable- update lipid panel today. Continue current meds for now  # Anxiety S:Medication: Paxil 20 mg A/P: reports reasonable control on meds- continue current meds. Reports irritability when tried to pull himself off   #Chronic back pain-in the past patient was on Percocet  -Currently on Flexeril 10 mg up to 3 times a day as needed (very sleepy 6 hours later- may cut in half) gabapentin 300 mg at bedtime, tramadol daily as needed  #Migraines S: Prophylaxis: Propranolol for blood pressure control but also likely helps reduce frequency of migraines. -Fioricet uses typically twice a week still  A/P: overall stable- continue current meds   # GERD S:Medication: Prilosec '40Mg'$  daily. B12 Low normal-he takes a B12 supplement -Endoscopy 04/08/2020 showing esophageal candidiasis A/P: overall stable- continue current meds   #Allergies-Astelin helpful still along with Singulair 10 mg  #incidental left renal cyst-  ordered ultrasound on 02/04/21 based on CT from [facility]/Dr. Pearlie Oyster about asbestosis- bosniak II- no further workup planned.   Recommended follow up: Return in about 6 months (around 05/13/2022) for followup or sooner if needed.Schedule b4 you leave. Future Appointments  Date Time Provider Funk  04/28/2022  8:00 AM [facility]   Lab/Order associations: fasting   ICD-10-CM   1. Preventative health care  Z00.00     2. Need for immunization against influenza  Z23 Flu Vaccine QUAD High Dose(Fluad)    3. Hyperlipidemia, unspecified hyperlipidemia type  E78.5 CBC with Differential/Platelet    Comprehensive metabolic panel    Lipid panel    4. History of prostate cancer  Z85.46       No orders of the defined types were placed in this  encounter.   Return precautions advised.  Garret Reddish, MD

## 2021-11-13 ENCOUNTER — Other Ambulatory Visit: Payer: Self-pay | Admitting: Family Medicine

## 2021-11-13 LAB — CBC WITH DIFFERENTIAL/PLATELET
Absolute Monocytes: 761 cells/uL (ref 200–950)
Basophils Absolute: 50 cells/uL (ref 0–200)
Basophils Relative: 1.1 %
Eosinophils Absolute: 81 cells/uL (ref 15–500)
Eosinophils Relative: 1.8 %
HCT: 45.3 % (ref 38.5–50.0)
Hemoglobin: 15.7 g/dL (ref 13.2–17.1)
Lymphs Abs: 1274 cells/uL (ref 850–3900)
MCH: 33 pg (ref 27.0–33.0)
MCHC: 34.7 g/dL (ref 32.0–36.0)
MCV: 95.2 fL (ref 80.0–100.0)
MPV: 9.4 fL (ref 7.5–12.5)
Monocytes Relative: 16.9 %
Neutro Abs: 2336 cells/uL (ref 1500–7800)
Neutrophils Relative %: 51.9 %
Platelets: 264 10*3/uL (ref 140–400)
RBC: 4.76 10*6/uL (ref 4.20–5.80)
RDW: 11.8 % (ref 11.0–15.0)
Total Lymphocyte: 28.3 %
WBC: 4.5 10*3/uL (ref 3.8–10.8)

## 2021-11-13 LAB — LIPID PANEL
Cholesterol: 123 mg/dL (ref <200)
HDL: 44 mg/dL (ref >=40)
LDL Cholesterol (Calc): 60 mg/dL (calc)
Non-HDL Cholesterol (Calc): 79 mg/dL (calc) (ref <130)
Total CHOL/HDL Ratio: 2.8 (calc) (ref <5.0)
Triglycerides: 107 mg/dL (ref <150)

## 2021-11-13 LAB — COMPREHENSIVE METABOLIC PANEL
AG Ratio: 1.4 (calc) (ref 1.0–2.5)
ALT: 22 U/L (ref 9–46)
AST: 21 U/L (ref 10–35)
Albumin: 4 g/dL (ref 3.6–5.1)
Alkaline phosphatase (APISO): 73 U/L (ref 35–144)
BUN: 13 mg/dL (ref 7–25)
CO2: 26 mmol/L (ref 20–32)
Calcium: 9 mg/dL (ref 8.6–10.3)
Chloride: 102 mmol/L (ref 98–110)
Creat: 0.82 mg/dL (ref 0.70–1.28)
Globulin: 2.8 g/dL (calc) (ref 1.9–3.7)
Glucose, Bld: 90 mg/dL (ref 65–99)
Potassium: 3.2 mmol/L — ABNORMAL LOW (ref 3.5–5.3)
Sodium: 140 mmol/L (ref 135–146)
Total Bilirubin: 0.7 mg/dL (ref 0.2–1.2)
Total Protein: 6.8 g/dL (ref 6.1–8.1)

## 2021-11-13 MED ORDER — POTASSIUM CHLORIDE CRYS ER 20 MEQ PO TBCR: 20.0000 meq | EXTENDED_RELEASE_TABLET | ORAL | 3 refills | Status: DC

## 2021-11-29 ENCOUNTER — Telehealth: Payer: Self-pay | Admitting: Family Medicine

## 2021-11-29 NOTE — Telephone Encounter (Signed)
Patient requests to be called at ph# [phone] to discuss Shingles vaccine and if Patient had a Flu Shot at office visit on 11/12/21

## 2021-11-29 NOTE — Telephone Encounter (Signed)
Called and spoke with pt and answered question regarding flu shot and Shingrix.

## 2021-12-08 ENCOUNTER — Other Ambulatory Visit: Payer: Self-pay | Admitting: Family Medicine

## 2021-12-09 ENCOUNTER — Other Ambulatory Visit: Payer: Self-pay | Admitting: Gastroenterology

## 2021-12-14 ENCOUNTER — Other Ambulatory Visit: Payer: Self-pay

## 2021-12-14 ENCOUNTER — Encounter: Payer: Self-pay | Admitting: Emergency Medicine

## 2021-12-14 ENCOUNTER — Encounter (HOSPITAL_BASED_OUTPATIENT_CLINIC_OR_DEPARTMENT_OTHER): Payer: Self-pay

## 2021-12-14 ENCOUNTER — Emergency Department (HOSPITAL_BASED_OUTPATIENT_CLINIC_OR_DEPARTMENT_OTHER)
Admission: EM | Admit: 2021-12-14 | Discharge: 2021-12-14 | Disposition: A | Discharge status: Home / Self Care | Payer: Medicare HMO | Attending: Emergency Medicine | Admitting: Emergency Medicine

## 2021-12-14 ENCOUNTER — Ambulatory Visit (INDEPENDENT_AMBULATORY_CARE_PROVIDER_SITE_OTHER)
Admission: EM | Admit: 2021-12-14 | Discharge: 2021-12-14 | Disposition: A | Discharge status: Home / Self Care | Payer: Medicare HMO | Source: Home / Self Care

## 2021-12-14 ENCOUNTER — Telehealth: Payer: Self-pay | Admitting: Family Medicine

## 2021-12-14 DIAGNOSIS — R52 Pain, unspecified: Secondary | ICD-10-CM | POA: Insufficient documentation

## 2021-12-14 DIAGNOSIS — R6883 Chills (without fever): Secondary | ICD-10-CM | POA: Insufficient documentation

## 2021-12-14 DIAGNOSIS — R531 Weakness: Secondary | ICD-10-CM | POA: Insufficient documentation

## 2021-12-14 DIAGNOSIS — Z79899 Other long term (current) drug therapy: Secondary | ICD-10-CM | POA: Insufficient documentation

## 2021-12-14 DIAGNOSIS — R197 Diarrhea, unspecified: Secondary | ICD-10-CM | POA: Insufficient documentation

## 2021-12-14 DIAGNOSIS — R112 Nausea with vomiting, unspecified: Secondary | ICD-10-CM | POA: Insufficient documentation

## 2021-12-14 DIAGNOSIS — R109 Unspecified abdominal pain: Secondary | ICD-10-CM | POA: Diagnosis not present

## 2021-12-14 DIAGNOSIS — Z1152 Encounter for screening for COVID-19: Secondary | ICD-10-CM | POA: Insufficient documentation

## 2021-12-14 LAB — CBC
HCT: 48.1 % (ref 39.0–52.0)
Hemoglobin: 16.4 g/dL (ref 13.0–17.0)
MCH: 31.7 pg (ref 26.0–34.0)
MCHC: 34.1 g/dL (ref 30.0–36.0)
MCV: 92.9 fL (ref 80.0–100.0)
Platelets: 251 10*3/uL (ref 150–400)
RBC: 5.18 MIL/uL (ref 4.22–5.81)
RDW: 12.1 % (ref 11.5–15.5)
WBC: 7.2 10*3/uL (ref 4.0–10.5)
nRBC: 0 % (ref 0.0–0.2)

## 2021-12-14 LAB — COMPREHENSIVE METABOLIC PANEL
ALT: 25 U/L (ref 0–44)
AST: 22 U/L (ref 15–41)
Albumin: 4.3 g/dL (ref 3.5–5.0)
Alkaline Phosphatase: 86 U/L (ref 38–126)
Anion gap: 11 (ref 5–15)
BUN: 13 mg/dL (ref 8–23)
CO2: 28 mmol/L (ref 22–32)
Calcium: 9.3 mg/dL (ref 8.9–10.3)
Chloride: 102 mmol/L (ref 98–111)
Creatinine, Ser: 0.79 mg/dL (ref 0.61–1.24)
GFR, Estimated: >60 mL/min (ref >60)
Glucose, Bld: 104 mg/dL — ABNORMAL HIGH (ref 70–99)
Potassium: 3.3 mmol/L — ABNORMAL LOW (ref 3.5–5.1)
Sodium: 141 mmol/L (ref 135–145)
Total Bilirubin: 0.8 mg/dL (ref 0.3–1.2)
Total Protein: 7.1 g/dL (ref 6.5–8.1)

## 2021-12-14 LAB — URINALYSIS, ROUTINE W REFLEX MICROSCOPIC
Bilirubin Urine: NEGATIVE
Glucose, UA: NEGATIVE mg/dL
Hgb urine dipstick: NEGATIVE
Leukocytes,Ua: NEGATIVE
Nitrite: NEGATIVE
Specific Gravity, Urine: 1.033 — ABNORMAL HIGH (ref 1.005–1.030)
pH: 5.5 (ref 5.0–8.0)

## 2021-12-14 LAB — RESP PANEL BY RT-PCR (FLU A&B, COVID) ARPGX2
Influenza A by PCR: NEGATIVE
Influenza B by PCR: NEGATIVE
SARS Coronavirus 2 by RT PCR: NEGATIVE

## 2021-12-14 LAB — LIPASE, BLOOD: Lipase: <10 U/L — ABNORMAL LOW (ref 11–51)

## 2021-12-14 MED ORDER — ONDANSETRON 4 MG PO TBDP: ORAL_TABLET | ORAL | 0 refills | Status: DC

## 2021-12-14 MED ORDER — SODIUM CHLORIDE 0.9 % IV BOLUS
250.0000 mL | Freq: Once | INTRAVENOUS | Status: AC
  Administered 2021-12-14: 250 mL via INTRAVENOUS

## 2021-12-14 MED ORDER — PROMETHAZINE HCL 25 MG RE SUPP: 25.0000 mg | Freq: Four times a day (QID) | RECTAL | 0 refills | Status: DC | PRN

## 2021-12-14 MED ORDER — PROMETHAZINE HCL 25 MG PO TABS
ORAL_TABLET | ORAL | Status: AC
  Filled 2021-12-14: qty 1

## 2021-12-14 MED ORDER — DIPHENHYDRAMINE HCL 50 MG/ML IJ SOLN
12.5000 mg | Freq: Once | INTRAMUSCULAR | Status: AC
  Administered 2021-12-14: 12.5 mg via INTRAVENOUS
  Filled 2021-12-14: qty 1

## 2021-12-14 MED ORDER — METOCLOPRAMIDE HCL 5 MG/ML IJ SOLN
5.0000 mg | Freq: Once | INTRAMUSCULAR | Status: AC
  Administered 2021-12-14: 5 mg via INTRAVENOUS
  Filled 2021-12-14: qty 2

## 2021-12-14 MED ORDER — ONDANSETRON HCL 4 MG/2ML IJ SOLN
4.0000 mg | Freq: Once | INTRAMUSCULAR | Status: AC
  Administered 2021-12-14: 4 mg via INTRAMUSCULAR

## 2021-12-14 MED ORDER — PROMETHAZINE HCL 25 MG PO TABS
12.5000 mg | ORAL_TABLET | Freq: Once | ORAL | Status: AC
  Administered 2021-12-14: 12.5 mg via ORAL

## 2021-12-14 NOTE — ED Triage Notes (Addendum)
Pt reports emesis, body aches waist down, diarrhea since last night. Pt reports intermittent headache.  Tried zofran x2 and reports emesis continued.

## 2021-12-14 NOTE — Telephone Encounter (Signed)
FYI

## 2021-12-14 NOTE — ED Provider Notes (Signed)
[facility]    CSN: [id number] Arrival date & time: 12/14/21  1649      History   Chief Complaint Chief Complaint  Patient presents with   Weakness    HPI Mark [name] is a 71 y.o. male.   The history is provided by the patient.   Patient presents with a 1 day history of body aches, nausea, vomiting, and weakness.  Patient states symptoms started last evening when he developed diarrhea.  He states today around 9 AM, he began having nausea and vomiting episodes persisting every 10 minutes.  He states since that time, he has had weakness and getting in his legs.  He states that he has also experienced chills.  He denies fever, headache, chest pain, shortness of breath, difficulty breathing, or abdominal pain.  Patient states that his wife had the same or similar symptoms 1 day ago, but that she is doing better today.  Patient states that he last vomited approximately 30 minutes prior to his arrival in this clinic.  He states he has vomited approximately 6-7 times today.  He states that he took Zofran at home with minimal relief.  He states that he has received both his flu and COVID vaccines.  Past Medical History:  Diagnosis Date   Allergy    seasonal   Anxiety state, unspecified 09/01/2009   ASTHMA 03/12/2007   Asthma    Cancer ([place]) 2008   prostate cancer   Gastroparesis 05/01/2009   GERD 04/02/2009   Headache(784.0) 03/07/2007   HIATAL HERNIA 04/28/2009   Hiatal hernia    HYPERLIPIDEMIA 06/11/2007   HYPERTENSION 03/07/2007   Pneumonia    hx of   Polyp of colon, hyperplastic    PROSTATE CANCER, HX OF 03/07/2007   Suspected exposure to asbestos    have asbestosis testing every year   TRANSAMINASES, SERUM, ELEVATED 04/28/2009   Tubular adenoma of colon    Ulcer    as teenager    Patient Active Problem List   Diagnosis Date Noted   Aortic atherosclerosis ([place]) 03/25/2021   History of adenomatous polyp of colon 09/08/2017   Asbestosis ([place]) 02/05/2016    Allergic rhinitis 11/22/2013   Back pain 10/09/2013   Overweight 01/10/2012   Anxiety state 09/01/2009   Gastroparesis 05/01/2009   HIATAL HERNIA 04/28/2009   GERD 04/02/2009   Hyperlipidemia 06/11/2007   Asthma 03/12/2007   Essential hypertension 03/07/2007   Migraine headache 03/07/2007   PROSTATE CANCER, HX OF 03/07/2007    Past Surgical History:  Procedure Laterality Date   APPENDECTOMY     BACK SURGERY  05/2011   COLONOSCOPY     LUMBAR LAMINECTOMY/DECOMPRESSION MICRODISCECTOMY N/A 10/09/2013   Procedure: L4-5 DECOMPRESSION/FARAMINOTOMY REVISION ;  Surgeon: Melina Schools, MD;  Location: [facility];  Service: Orthopedics;  Laterality: N/A;   PROSTATECTOMY  07/10/06   ROTATOR CUFF REPAIR     9/09 on right   TRANSFORAMINAL LUMBAR INTERBODY FUSION (TLIF) WITH PEDICLE SCREW FIXATION 1 LEVEL N/A 10/09/2013   Procedure: TLIF L5-S1;  Surgeon: Melina Schools, MD;  Location: [facility];  Service: Orthopedics;  Laterality: N/A;       Home Medications    Prior to Admission medications   Medication Sig Start Date End Date Taking? Authorizing Provider  albuterol (PROAIR HFA) 108 (90 Base) MCG/ACT inhaler INHALE 2 PUFFS EVERY 6 HOURS AS NEEDED FOR WHEEZING OR SHORTNESS OF BREATH 10/22/19   Marin Olp, MD  amLODipine (NORVASC) 2.5 MG tablet TAKE 1 TABLET BY  MOUTH EVERY DAY 09/28/21   Marin Olp, MD  atorvastatin (LIPITOR) 20 MG tablet TAKE 1 TABLET (20 MG TOTAL) BY MOUTH DAILY. PT IS TAKING AT BEDTIME 12/08/21   Marin Olp, MD  Azelastine HCl 137 MCG/SPRAY SOLN PLACE 2 SPRAYS INTO EACH NOSTRIL DAILY. 10/07/20   Marin Olp, MD  butalbital-acetaminophen-caffeine (FIORICET) 50-325-40 MG tablet TAKE 1 TABLET BY MOUTH DAILY AS NEEDED- max 2 per week. 1 refill per month. 03/03/21   Marin Olp, MD  cyclobenzaprine (FLEXERIL) 10 MG tablet TAKE 0.5-1 TABLETS BY MOUTH 3 (THREE) TIMES DAILY AS NEEDED FOR MUSCLE SPASMS. 04/09/20   Marin Olp, MD  fexofenadine [facility]) 180 MG  tablet Patient is taking 2 times daily 11/21/18   Marin Olp, MD  fluticasone [facility]) 50 MCG/ACT nasal spray USE ONE SPRAY IN EACH NOSTRIL AS DIRECTED ONCE DAILY 07/20/20   Marin Olp, MD  gabapentin (NEURONTIN) 300 MG capsule Take 3 capsules (900 mg total) by mouth at bedtime. Patient taking differently: Take 900 mg by mouth as needed. 04/23/19   Marin Olp, MD  hydrochlorothiazide (HYDRODIURIL) 25 MG tablet TAKE 1 TABLET BY MOUTH TWICE A DAY 01/08/21   Marin Olp, MD  montelukast (SINGULAIR) 10 MG tablet TAKE 1 TABLET BY MOUTH EVERYDAY AT BEDTIME 12/15/20   Marin Olp, MD  omeprazole (PRILOSEC) 40 MG capsule One capsule daily 30 minutes before breakfast -schedule appointment for further refills 12/09/21   Nandigam, Venia Minks, MD  ondansetron (ZOFRAN) 4 MG tablet TAKE 1 TABLET BY MOUTH EVERY 8 HOURS AS NEEDED FOR NAUSEA AND VOMITING 11/26/20   Mauri Pole, MD  PARoxetine (PAXIL) 20 MG tablet TAKE 1 TABLET BY MOUTH EVERYDAY AT BEDTIME 01/07/21   Marin Olp, MD  potassium chloride SA (KLOR-CON M) 20 MEQ tablet Take 1 tablet (20 mEq total) by mouth once a week. 11/13/21   Marin Olp, MD  promethazine (PHENERGAN) 12.5 MG tablet Take 1 tablet (12.5 mg total) by mouth daily. As needed 03/09/20   Mauri Pole, MD  propranolol (INDERAL) 40 MG tablet TAKE 1 TABLET BY MOUTH TWICE A DAY 12/14/20   Marin Olp, MD  SYMBICORT 160-4.5 MCG/ACT inhaler TAKE 2 PUFFS BY MOUTH TWICE A DAY 11/09/20   Marin Olp, MD  traMADol (ULTRAM) 50 MG tablet Take 1 tablet (50 mg total) by mouth daily as needed. 04/26/21   Marin Olp, MD  VESICARE 5 MG tablet Take 1 tablet by mouth daily. 06/01/16   [provider]  vitamin B-12 (CYANOCOBALAMIN) 500 MCG tablet Take 500 mcg by mouth daily.    [provider]    Family History Family History  Problem Relation Age of Onset   Pancreatic cancer Mother 66   Hypertension Father    Heart  attack Father        28   Multiple sclerosis Sister    Heart disease Sister    Heart attack Sister    Hypertension Brother        x 2   Hypertension Brother    Kidney disease Brother    Hypertension Brother    Heart attack Maternal Grandmother    Heart attack Paternal Grandmother    Heart attack Paternal Grandfather    Liver cancer Daughter    Hypertension Son    Colon cancer Neg Hx    Rectal cancer Neg Hx    Stomach cancer Neg Hx    Esophageal cancer Neg  Hx     Social History Social History   Tobacco Use   Smoking status: Former    Packs/day: 0.50    Years: 2.00    Total pack years: 1.00    Types: Cigarettes    Quit date: 04/09/1983    Years since quitting: 38.7   Smokeless tobacco: Never  Vaping Use   Vaping Use: Never used  Substance Use Topics   Alcohol use: No    Alcohol/week: 0.0 standard drinks of alcohol   Drug use: No     Allergies   Propoxyphene hcl   Review of Systems Review of Systems Per HPI  Physical Exam Triage Vital Signs ED Triage Vitals [12/14/21 1745]  Enc Vitals Group     BP (!) 149/83     Pulse Rate (!) 126     Resp 20     Temp 99.5 F (37.5 C)     Temp Source Oral     SpO2 96 %     Weight      Height      Head Circumference      Peak Flow      Pain Score 4     Pain Loc      Pain Edu?      Excl. in [place]?    No data found.  Updated Vital Signs BP 139/79 (BP Location: Right Arm)   Pulse (!) 113   Temp 98.9 F (37.2 C) (Oral)   Resp 17   SpO2 93%   Visual Acuity Right Eye Distance:   Left Eye Distance:   Bilateral Distance:    Right Eye Near:   Left Eye Near:    Bilateral Near:     Physical Exam Vitals and nursing note reviewed.  Constitutional:      Appearance: Normal appearance. He is ill-appearing.  HENT:     Head: Normocephalic.     Right Ear: Tympanic membrane, ear canal and external ear normal.     Left Ear: Tympanic membrane, ear canal and external ear normal.     Nose: Nose normal.      Mouth/Throat:     Mouth: Mucous membranes are moist.  Eyes:     Extraocular Movements: Extraocular movements intact.     Conjunctiva/sclera: Conjunctivae normal.     Pupils: Pupils are equal, round, and reactive to light.  Cardiovascular:     Rate and Rhythm: Regular rhythm.     Pulses: Normal pulses.     Heart sounds: Normal heart sounds.  Pulmonary:     Effort: Pulmonary effort is normal.     Breath sounds: Normal breath sounds.  Abdominal:     General: Bowel sounds are normal.     Palpations: Abdomen is soft.     Tenderness: There is no abdominal tenderness.  Musculoskeletal:     Cervical back: Normal range of motion.  Lymphadenopathy:     Cervical: No cervical adenopathy.  Skin:    General: Skin is warm and dry.  Neurological:     General: No focal deficit present.     Mental Status: He is alert and oriented to person, place, and time.  Psychiatric:        Mood and Affect: Mood normal.        Behavior: Behavior normal.      UC Treatments / Results  Labs (all labs ordered are listed, but only abnormal results are displayed) Labs Reviewed  RESP PANEL BY RT-PCR (FLU A&B, COVID) [id number]  EKG: Sinus tachycardia with PACs, no STEMI.  Last EKG performed was in 2015 for comparison.   Radiology No results found.  Procedures Procedures (including critical care time)  Medications Ordered in UC Medications  ondansetron (ZOFRAN) injection 4 mg (4 mg Intramuscular Given 12/14/21 1821)    Initial Impression / Assessment and Plan / UC Course  I have reviewed the triage vital signs and the nursing notes.  Pertinent labs & imaging results that were available during my care of the patient were reviewed by me and considered in my medical decision making (see chart for details).  Patient presents for complaints of weakness with nausea, and vomiting that started today.  Patient's vital signs show that he is tachycardic and hypertensive.  Patient was given Zofran 4 mg IM for  his nausea, which seem to improve.  He is now drinking fluids and tolerating them at this time.  An EKG was performed which did show sinus tachycardia with PACs, no recent EKGs were available for comparison within the last year.  Based on suspected EKG changes, recommend that patient go to the emergency department for further evaluation.  Patient was in agreement with this plan of care.  Patient's all signs were rechecked prior to discharge, heart rate has decreased along with his blood pressure.  Patient is stable to travel to the emergency department via private vehicle.  Discussed with patient, and he is in agreement with this plan of care. Final Clinical Impressions(s) / UC Diagnoses   Final diagnoses:  Weakness     Discharge Instructions      Go to the emergency department for further evaluation.  As discussed, you are being sent to the emergency department due to changes in the EKG that was performed.     ED Prescriptions   None    PDMP not reviewed this encounter.   Tish Men, NP 12/14/21 2030

## 2021-12-14 NOTE — Telephone Encounter (Signed)
Patient states he started vomiting this morning and has been unable to keep down food (only had a little food) or drink and both legs are achy.  Transferred to Triage.

## 2021-12-14 NOTE — ED Provider Notes (Signed)
[facility] EMERGENCY DEPT Provider Note   CSN: [id number] Arrival date & time: 12/14/21  2004     History  Chief Complaint  Patient presents with   Abdominal Pain    Mark [name] is a 71 y.o. male.  71 yo M with a chief complaint of nausea vomiting and diarrhea.  The patient and his wife both ate chicken from a [employer] and then started developing symptoms.  His wife got sick first with nausea vomiting and diarrhea and then he ended up having it later.  He tried Zofran and Phenergan at home but was unable to keep it down.  He eventually went to urgent care and got an injection of Zofran.  That improved his symptoms somewhat but he started to feel a bit nauseated again.  He was then sent here for an abnormal EKG.   Abdominal Pain      Home Medications Prior to Admission medications   Medication Sig Start Date End Date Taking? Authorizing Provider  ondansetron (ZOFRAN-ODT) 4 MG disintegrating tablet '4mg'$  ODT q4 hours prn nausea/vomit 12/14/21  Yes Deno Etienne, DO  promethazine (PHENERGAN) 25 MG suppository Place 1 suppository (25 mg total) rectally every 6 (six) hours as needed for nausea or vomiting. 12/14/21  Yes Deno Etienne, DO  albuterol [facility] HFA) 108 [phone] Base) MCG/ACT inhaler INHALE 2 PUFFS EVERY 6 HOURS AS NEEDED FOR WHEEZING OR SHORTNESS OF BREATH 10/22/19   Marin Olp, MD  amLODipine (NORVASC) 2.5 MG tablet TAKE 1 TABLET BY MOUTH EVERY DAY 09/28/21   Marin Olp, MD  atorvastatin (LIPITOR) 20 MG tablet TAKE 1 TABLET (20 MG TOTAL) BY MOUTH DAILY. PT IS TAKING AT BEDTIME 12/08/21   Marin Olp, MD  Azelastine HCl 137 MCG/SPRAY SOLN PLACE 2 SPRAYS INTO EACH NOSTRIL DAILY. 10/07/20   Marin Olp, MD  butalbital-acetaminophen-caffeine (FIORICET) 50-325-40 MG tablet TAKE 1 TABLET BY MOUTH DAILY AS NEEDED- max 2 per week. 1 refill per month. 03/03/21   Marin Olp, MD  cyclobenzaprine (FLEXERIL) 10 MG tablet TAKE 0.5-1 TABLETS BY MOUTH  3 (THREE) TIMES DAILY AS NEEDED FOR MUSCLE SPASMS. 04/09/20   Marin Olp, MD  fexofenadine [facility]) 180 MG tablet Patient is taking 2 times daily 11/21/18   Marin Olp, MD  fluticasone [facility]) 50 MCG/ACT nasal spray USE ONE SPRAY IN EACH NOSTRIL AS DIRECTED ONCE DAILY 07/20/20   Marin Olp, MD  gabapentin (NEURONTIN) 300 MG capsule Take 3 capsules (900 mg total) by mouth at bedtime. Patient taking differently: Take 900 mg by mouth as needed. 04/23/19   Marin Olp, MD  hydrochlorothiazide (HYDRODIURIL) 25 MG tablet TAKE 1 TABLET BY MOUTH TWICE A DAY 01/08/21   Marin Olp, MD  montelukast (SINGULAIR) 10 MG tablet TAKE 1 TABLET BY MOUTH EVERYDAY AT BEDTIME 12/15/20   Marin Olp, MD  omeprazole (PRILOSEC) 40 MG capsule One capsule daily 30 minutes before breakfast -schedule appointment for further refills 12/09/21   Nandigam, Venia Minks, MD  ondansetron (ZOFRAN) 4 MG tablet TAKE 1 TABLET BY MOUTH EVERY 8 HOURS AS NEEDED FOR NAUSEA AND VOMITING 11/26/20   Mauri Pole, MD  PARoxetine (PAXIL) 20 MG tablet TAKE 1 TABLET BY MOUTH EVERYDAY AT BEDTIME 01/07/21   Marin Olp, MD  potassium chloride SA (KLOR-CON M) 20 MEQ tablet Take 1 tablet (20 mEq total) by mouth once a week. 11/13/21   Marin Olp, MD  promethazine (PHENERGAN) 12.5 MG tablet Take  1 tablet (12.5 mg total) by mouth daily. As needed 03/09/20   Mauri Pole, MD  propranolol (INDERAL) 40 MG tablet TAKE 1 TABLET BY MOUTH TWICE A DAY 12/14/20   Marin Olp, MD  SYMBICORT 160-4.5 MCG/ACT inhaler TAKE 2 PUFFS BY MOUTH TWICE A DAY 11/09/20   Marin Olp, MD  traMADol (ULTRAM) 50 MG tablet Take 1 tablet (50 mg total) by mouth daily as needed. 04/26/21   Marin Olp, MD  VESICARE 5 MG tablet Take 1 tablet by mouth daily. 06/01/16   [provider]  vitamin B-12 (CYANOCOBALAMIN) 500 MCG tablet Take 500 mcg by mouth daily.    [provider]      Allergies     Propoxyphene hcl    Review of Systems   Review of Systems  Gastrointestinal:  Positive for abdominal pain.    Physical Exam Updated Vital Signs BP (!) 140/80   Pulse 98   Temp 98.9 F (37.2 C) (Oral)   Resp 18   Ht '5\' 11"'$  (1.803 m)   Wt 88.5 kg   SpO2 97%   BMI 27.20 kg/m  Physical Exam Vitals and nursing note reviewed.  Constitutional:      Appearance: He is well-developed.  HENT:     Head: Normocephalic and atraumatic.  Eyes:     Pupils: Pupils are equal, round, and reactive to light.  Neck:     Vascular: No JVD.  Cardiovascular:     Rate and Rhythm: Tachycardia present. Rhythm irregular.     Heart sounds: No murmur heard.    No friction rub. No gallop.  Pulmonary:     Effort: No respiratory distress.     Breath sounds: No wheezing.  Abdominal:     General: There is no distension.     Tenderness: There is no abdominal tenderness. There is no guarding or rebound.  Musculoskeletal:        General: Normal range of motion.     Cervical back: Normal range of motion and neck supple.  Skin:    Coloration: Skin is not pale.     Findings: No rash.  Neurological:     Mental Status: He is alert and oriented to person, place, and time.  Psychiatric:        Behavior: Behavior normal.     ED Results / Procedures / Treatments   Labs (all labs ordered are listed, but only abnormal results are displayed) Labs Reviewed  LIPASE, BLOOD - Abnormal; Notable for the following components:      Result Value   Lipase <10 (*)    All other components within normal limits  COMPREHENSIVE METABOLIC PANEL - Abnormal; Notable for the following components:   Potassium 3.3 (*)    Glucose, Bld 104 (*)    All other components within normal limits  URINALYSIS, ROUTINE W REFLEX MICROSCOPIC - Abnormal; Notable for the following components:   Specific Gravity, Urine 1.033 (*)    Ketones, ur TRACE (*)    Protein, ur TRACE (*)    All other components within normal limits  CBC     EKG EKG Interpretation  Date/Time:  Tuesday December 14 2021 21:22:44 EDT Ventricular Rate:  103 PR Interval:  160 QRS Duration: 85 QT Interval:  344 QTC Calculation: 451 R Axis:   50 Text Interpretation: Sinus tachycardia Atrial premature complexes Inferior infarct, age indeterminate No significant change since last tracing Confirmed by Deno Etienne [phone]) on 12/14/2021 11:19:59 PM  Radiology No  results found.  Procedures Procedures    Medications Ordered in ED Medications  promethazine (PHENERGAN) 25 MG tablet (has no administration in time range)  sodium chloride 0.9 % bolus 250 mL ( Intravenous Stopped 12/14/21 2233)  promethazine (PHENERGAN) tablet 12.5 mg (12.5 mg Oral Given 12/14/21 2154)  metoCLOPramide (REGLAN) injection 5 mg (5 mg Intravenous Given 12/14/21 2302)  diphenhydrAMINE (BENADRYL) injection 12.5 mg (12.5 mg Intravenous Given 12/14/21 2301)    ED Course/ Medical Decision Making/ A&P                           Medical Decision Making Amount and/or Complexity of Data Reviewed Labs: ordered. ECG/medicine tests: ordered.  Risk Prescription drug management.   28 yoM with a chief complaints of nausea vomiting and diarrhea.  His wife has similar symptoms and both ate food from a [employer] and thinks that was the cause.  They have a young child that lives with them but they do not think that they have been sick.  He denies any abdominal pain.  Feels like his legs are weak.  He went to urgent care and they were concerned about his EKG and sent him here for evaluation.  I am able to review the EKG from urgent care.  Patient with an irregular rate.  He does have identifiable P waves.  Repeated here again with likely sinus arrhythmia.  We will give a small bolus of IV fluids.  Oral trial.  Reassess.  LFTs and lipase are unremarkable.  Mild hypokalemia at 3.3.  No change to renal function.  Patient nausea is improved.  Able to tolerate by mouth.  Discharge  home.  11:20 PM:  I have discussed the diagnosis/risks/treatment options with the patient and family.  Evaluation and diagnostic testing in the emergency department does not suggest an emergent condition requiring admission or immediate intervention beyond what has been performed at this time.  They will follow up with PCP. We also discussed returning to the ED immediately if new or worsening sx occur. We discussed the sx which are most concerning (e.g., sudden worsening pain, fever, inability to tolerate by mouth) that necessitate immediate return. Medications administered to the patient during their visit and any new prescriptions provided to the patient are listed below.  Medications given during this visit Medications  promethazine (PHENERGAN) 25 MG tablet (has no administration in time range)  sodium chloride 0.9 % bolus 250 mL ( Intravenous Stopped 12/14/21 2233)  promethazine (PHENERGAN) tablet 12.5 mg (12.5 mg Oral Given 12/14/21 2154)  metoCLOPramide (REGLAN) injection 5 mg (5 mg Intravenous Given 12/14/21 2302)  diphenhydrAMINE (BENADRYL) injection 12.5 mg (12.5 mg Intravenous Given 12/14/21 2301)     The patient appears reasonably screen and/or stabilized for discharge and I doubt any other medical condition or other [facility] requiring further screening, evaluation, or treatment in the ED at this time prior to discharge.          Final Clinical Impression(s) / ED Diagnoses Final diagnoses:  Nausea vomiting and diarrhea    Rx / DC Orders ED Discharge Orders          Ordered    ondansetron (ZOFRAN-ODT) 4 MG disintegrating tablet        12/14/21 2310    promethazine (PHENERGAN) 25 MG suppository  Every 6 hours PRN        12/14/21 2310              Deno Etienne,  DO 12/14/21 2320

## 2021-12-14 NOTE — Telephone Encounter (Signed)
Has appeared at urgent care per epic

## 2021-12-14 NOTE — Telephone Encounter (Signed)
Patient Name: Mark [name] [facility] Gender: Male DOB: 1950-05-04 Age: 71 Y 3 M 1 D Return Phone Number: [phone] (Primary), [phone] (Secondary) Address: City/ [place]/ [place] [address] Client [facility] [place] [facility] [place] [facility] Provider Garret Reddish- MD Contact Type Call Who Is Calling Patient / Member / Family / Caregiver Call Type Triage / Clinical Relationship To Patient Self Return Phone Number [phone] (Primary) Chief Complaint Vomiting Reason for Call Symptomatic / Request for [place] states he is vomiting and unable to keep down food or drink. Both his legs a re achy. Translation No Nurse Assessment Nurse: Radford Pax, RN, Eugene Garnet Date/Time Eilene Ghazi Time): 12/14/2021 3:49:13 PM Confirm and document reason for call. If symptomatic, describe symptoms. ---Vomiting since this morning Does the patient have any new or worsening symptoms? ---Yes Will a triage be completed? ---Yes Related visit to physician within the last 2 weeks? ---No Does the PT have any chronic conditions? (i.e. diabetes, asthma, this includes High risk factors for pregnancy, etc.) ---Yes List chronic conditions. ---HTN Is this a behavioral health or substance abuse call? ---No Guidelines Guideline Title Affirmed Question Affirmed Notes Nurse Date/Time Eilene Ghazi Time) Vomiting [1] MODERATE vomiting (e.g., 3 - 5 times/day) AND [2] age > 42 years Radford Pax, [place], [place] 12/14/2021 3:49:53 PM Disp. Time Eilene Ghazi Time) Disposition Final User 12/14/2021 3:35:57 PM Attempt made - message left Radford Pax, RN, Eugene Garnet 12/14/2021 3:57:22 PM Go to ED Now (or PCP triage) Yes Radford Pax, RN, Eugene Garnet   Final Disposition 12/14/2021 3:57:22 PM Go to ED Now (or PCP triage) Yes Radford Pax, RN, Eugene Garnet Caller Disagree/Comply Comply Caller Understands Yes PreDisposition Call Doctor Care Advice Given Per  Guideline GO TO ED NOW (OR PCP TRIAGE): * IF NO PCP (PRIMARY CARE PROVIDER) SECOND-LEVEL TRIAGE: You need to be seen within the next hour. Go to the [facility] at _____________ [place] as soon as you can. CARE ADVICE per Vomiting (Adult) guideline. Referrals [facility] [place]

## 2021-12-14 NOTE — Discharge Instructions (Signed)
I think most likely you have a viral syndrome that is causing you to have nausea vomiting and diarrhea.  I prescribed you 2 different nausea medicines he can take at home if needed.  Typically I have you take Imodium which is over-the-counter.  Please follow the instructions on the bottle.  Please return for abdominal pain inability eat or drink.

## 2021-12-14 NOTE — ED Notes (Signed)
Patient is being discharged from the Urgent Care and sent to the Emergency Department via POV . Per NP, patient is in need of higher level of care due to Abnormal EKG. Patient is aware and verbalizes understanding of plan of care.  Vitals:   12/14/21 1745 12/14/21 1918  BP: (!) 149/83 139/79  Pulse: (!) 126 (!) 113  Resp: 20 17  Temp: 99.5 F (37.5 C) 98.9 F (37.2 C)  SpO2: 96% 93%

## 2021-12-14 NOTE — ED Triage Notes (Signed)
Patient here POV from Urgent Care for N/V that was not resolved by Zofran x2. Patient also states that the MD at Urgent Care "saw some changes on my EKG but I know there's changes from 8 years ago". Denies any chest pain.

## 2021-12-14 NOTE — ED Notes (Signed)
Pt provided water and diet coke for PO challenge.

## 2021-12-14 NOTE — Discharge Instructions (Signed)
Go to the emergency department for further evaluation.  As discussed, you are being sent to the emergency department due to changes in the EKG that was performed.

## 2022-01-13 ENCOUNTER — Other Ambulatory Visit: Payer: Self-pay | Admitting: Family Medicine

## 2022-01-18 ENCOUNTER — Other Ambulatory Visit: Payer: Self-pay | Admitting: Family Medicine

## 2022-02-06 ENCOUNTER — Other Ambulatory Visit: Payer: Self-pay | Admitting: Family Medicine

## 2022-02-07 ENCOUNTER — Other Ambulatory Visit: Payer: Self-pay | Admitting: Family Medicine

## 2022-02-23 ENCOUNTER — Other Ambulatory Visit: Payer: Self-pay | Admitting: Family Medicine

## 2022-03-14 ENCOUNTER — Other Ambulatory Visit: Payer: Self-pay | Admitting: Family Medicine

## 2022-04-04 ENCOUNTER — Encounter: Payer: Self-pay | Admitting: Family

## 2022-04-04 ENCOUNTER — Ambulatory Visit (INDEPENDENT_AMBULATORY_CARE_PROVIDER_SITE_OTHER): Payer: Medicare HMO | Admitting: Family

## 2022-04-04 VITALS — BP 155/91 | HR 75 | Temp 97.8°F | Ht 71.0 in | Wt 197.1 lb

## 2022-04-04 DIAGNOSIS — U071 COVID-19: Secondary | ICD-10-CM | POA: Diagnosis not present

## 2022-04-04 LAB — POC COVID19 BINAXNOW: SARS Coronavirus 2 Ag: POSITIVE — AB

## 2022-04-04 MED ORDER — NIRMATRELVIR/RITONAVIR (PAXLOVID)TABLET: 3.0000 | ORAL_TABLET | Freq: Two times a day (BID) | ORAL | 0 refills | Status: AC

## 2022-04-04 NOTE — Progress Notes (Signed)
Patient ID: Mark [name], male    DOB: [birth year], 72 y.o.   MRN: [id number]  Chief Complaint  Patient presents with   Sinus Problem    Pt c/o runny nose, chills,headache and cough for a couple of weeks. Has tried zyrtec, cough syrup OTC and allegra which did not help.  Cough is worse at night . Wife and grandson is covid positive.     HPI:      URI sx:   Pt c/o runny nose, chills,headache and cough for a couple of weeks. Has tried zyrtec, cough syrup OTC and allegra which did not help.  Cough is worse at night . Wife and grandson is covid positive.   Assessment & Plan:  1. COVID-19 - Sending Paxlovid pt advised of FDA label approval for use, how to take, & SE. Advised to hold his Lipitor while taking the Paxlovid. Advised of CDC guidelines for masking if out in public. OK to continue taking OTC sinus or pain meds. Needs to resume taking his Symbicort bid and use the Albuterol also mid day and before bedtime for the next few days. Encouraged to monitor & notify office of any worsening symptoms: increased shortness of breath, weakness, and signs of dehydration. Instructed to rest and hydrate well.   - POC COVID-19 - nirmatrelvir/ritonavir (PAXLOVID) 20 x 150 MG & 10 x '100MG'$  TABS; Take 3 tablets by mouth 2 (two) times daily for 5 days. (Take nirmatrelvir 150 mg two tablets twice daily for 5 days and ritonavir 100 mg one tablet twice daily for 5 days)  Dispense: 30 tablet; Refill: 0   Subjective:    Outpatient Medications Prior to Visit  Medication Sig Dispense Refill   albuterol (PROAIR HFA) 108 (90 Base) MCG/ACT inhaler INHALE 2 PUFFS EVERY 6 HOURS AS NEEDED FOR WHEEZING OR SHORTNESS OF BREATH 76.5 g 3   amLODipine (NORVASC) 2.5 MG tablet TAKE 1 TABLET BY MOUTH EVERY DAY 90 tablet 1   atorvastatin (LIPITOR) 20 MG tablet TAKE 1 TABLET (20 MG TOTAL) BY MOUTH DAILY. PT IS TAKING AT BEDTIME 90 tablet 3   Azelastine HCl 137 MCG/SPRAY SOLN PLACE 2 SPRAYS INTO EACH NOSTRIL DAILY. 30 mL 3    butalbital-acetaminophen-caffeine (FIORICET) 50-325-40 MG tablet TAKE 1 TABLET BY MOUTH DAILY AS NEEDED- MAX 2 PER WEEK. 1 REFILL PER MONTH. 10 tablet 5   cyclobenzaprine (FLEXERIL) 10 MG tablet TAKE 0.5-1 TABLETS BY MOUTH 3 (THREE) TIMES DAILY AS NEEDED FOR MUSCLE SPASMS. 30 tablet 5   fexofenadine (ALLEGRA) 180 MG tablet Patient is taking 2 times daily 90 tablet 1   fluticasone (FLONASE) 50 MCG/ACT nasal spray USE ONE SPRAY IN EACH NOSTRIL AS DIRECTED ONCE DAILY 48 mL 3   gabapentin (NEURONTIN) 300 MG capsule Take 3 capsules (900 mg total) by mouth at bedtime. (Patient taking differently: Take 900 mg by mouth as needed.) 30 capsule 1   hydrochlorothiazide (HYDRODIURIL) 25 MG tablet TAKE 1 TABLET BY MOUTH TWICE A DAY 180 tablet 3   montelukast (SINGULAIR) 10 MG tablet TAKE 1 TABLET BY MOUTH EVERYDAY AT BEDTIME 90 tablet 3   omeprazole (PRILOSEC) 40 MG capsule One capsule daily 30 minutes before breakfast -schedule appointment for further refills 90 capsule 1   ondansetron (ZOFRAN) 4 MG tablet TAKE 1 TABLET BY MOUTH EVERY 8 HOURS AS NEEDED FOR NAUSEA AND VOMITING 30 tablet 3   ondansetron (ZOFRAN-ODT) 4 MG disintegrating tablet '4mg'$  ODT q4 hours prn nausea/vomit 20 tablet 0   PARoxetine (PAXIL) 20 MG  tablet TAKE 1 TABLET BY MOUTH EVERYDAY AT BEDTIME 90 tablet 1   potassium chloride SA (KLOR-CON M) 20 MEQ tablet Take 1 tablet (20 mEq total) by mouth once a week. 13 tablet 3   promethazine (PHENERGAN) 12.5 MG tablet Take 1 tablet (12.5 mg total) by mouth daily. As needed 30 tablet 3   promethazine (PHENERGAN) 25 MG suppository Place 1 suppository (25 mg total) rectally every 6 (six) hours as needed for nausea or vomiting. 12 each 0   propranolol (INDERAL) 40 MG tablet TAKE 1 TABLET BY MOUTH TWICE A DAY 180 tablet 3   SYMBICORT 160-4.5 MCG/ACT inhaler TAKE 2 PUFFS BY MOUTH TWICE A DAY 10.2 each 11   traMADol (ULTRAM) 50 MG tablet TAKE 1 TABLET BY MOUTH DAILY AS NEEDED. 30 tablet 5   VESICARE 5 MG tablet  Take 1 tablet by mouth daily.     vitamin B-12 (CYANOCOBALAMIN) 500 MCG tablet Take 500 mcg by mouth daily.     No facility-administered medications prior to visit.   Past Medical History:  Diagnosis Date   Allergy    seasonal   Anxiety state, unspecified 09/01/2009   ASTHMA 03/12/2007   Asthma    Cancer ([place]) 2008   prostate cancer   Gastroparesis 05/01/2009   GERD 04/02/2009   Headache(784.0) 03/07/2007   HIATAL HERNIA 04/28/2009   Hiatal hernia    HYPERLIPIDEMIA 06/11/2007   HYPERTENSION 03/07/2007   Pneumonia    hx of   Polyp of colon, hyperplastic    PROSTATE CANCER, HX OF 03/07/2007   Suspected exposure to asbestos    have asbestosis testing every year   TRANSAMINASES, SERUM, ELEVATED 04/28/2009   Tubular adenoma of colon    Ulcer    as teenager   Past Surgical History:  Procedure Laterality Date   APPENDECTOMY     BACK SURGERY  05/2011   COLONOSCOPY     LUMBAR LAMINECTOMY/DECOMPRESSION MICRODISCECTOMY N/A 10/09/2013   Procedure: L4-5 DECOMPRESSION/FARAMINOTOMY REVISION ;  Surgeon: Melina Schools, MD;  Location: [facility];  Service: Orthopedics;  Laterality: N/A;   PROSTATECTOMY  07/10/06   ROTATOR CUFF REPAIR     9/09 on right   TRANSFORAMINAL LUMBAR INTERBODY FUSION (TLIF) WITH PEDICLE SCREW FIXATION 1 LEVEL N/A 10/09/2013   Procedure: TLIF L5-S1;  Surgeon: Melina Schools, MD;  Location: [facility];  Service: Orthopedics;  Laterality: N/A;   Allergies  Allergen Reactions   Propoxyphene Hcl Nausea And Vomiting           Objective:    Physical Exam Vitals and nursing note reviewed.  Constitutional:      General: He is not in acute distress.    Appearance: Normal appearance. He is ill-appearing.  HENT:     Head: Normocephalic.     Right Ear: Tympanic membrane and ear canal normal.     Left Ear: Tympanic membrane and ear canal normal.     Nose:     Right Sinus: Frontal sinus tenderness present. No maxillary sinus tenderness.     Left Sinus: Frontal sinus tenderness present. No  maxillary sinus tenderness.     Mouth/Throat:     Mouth: Mucous membranes are moist.     Pharynx: Posterior oropharyngeal erythema present. No pharyngeal swelling, oropharyngeal exudate or uvula swelling.     Tonsils: No tonsillar exudate or tonsillar abscesses.  Cardiovascular:     Rate and Rhythm: Normal rate and regular rhythm.  Pulmonary:     Effort: Pulmonary effort is normal.  Breath sounds: Examination of the right-middle field reveals wheezing. Examination of the left-middle field reveals wheezing. Wheezing present.  Musculoskeletal:        General: Normal range of motion.     Cervical back: Normal range of motion.  Lymphadenopathy:     Head:     Right side of head: No preauricular or posterior auricular adenopathy.     Left side of head: No preauricular or posterior auricular adenopathy.     Cervical: No cervical adenopathy.  Skin:    General: Skin is warm and dry.  Neurological:     Mental Status: He is alert and oriented to person, place, and time.  Psychiatric:        Mood and Affect: Mood normal.    BP (!) 155/91 (BP Location: Left Arm, Patient Position: Sitting, Cuff Size: Large)   Pulse 75   Temp 97.8 F (36.6 C) (Temporal)   Ht '5\' 11"'$  (1.803 m)   Wt 197 lb 2 oz (89.4 kg)   SpO2 98%   BMI 27.49 kg/m  Wt Readings from Last 3 Encounters:  04/04/22 197 lb 2 oz (89.4 kg)  12/14/21 195 lb (88.5 kg)  11/12/21 200 lb 12.8 oz (91.1 kg)       Jeanie Sewer, NP

## 2022-04-22 ENCOUNTER — Other Ambulatory Visit: Payer: Self-pay | Admitting: Gastroenterology

## 2022-04-25 ENCOUNTER — Encounter: Payer: Self-pay | Admitting: Gastroenterology

## 2022-04-28 ENCOUNTER — Ambulatory Visit (INDEPENDENT_AMBULATORY_CARE_PROVIDER_SITE_OTHER): Payer: Medicare HMO

## 2022-04-28 VITALS — BP 130/80 | HR 70 | Temp 97.3°F | Wt 201.0 lb

## 2022-04-28 DIAGNOSIS — Z0000 Encounter for general adult medical examination without abnormal findings: Secondary | ICD-10-CM

## 2022-04-28 NOTE — Progress Notes (Signed)
Subjective:   Mark [name] is a 72 y.o. male who presents for Medicare Annual/Subsequent preventive examination.  Review of Systems     Cardiac Risk Factors include: advanced age (>54mn, >>27women);dyslipidemia;hypertension;male gender     Objective:    Today's Vitals   04/28/22 0819  BP: 130/80  Pulse: 70  Temp: (!) 97.3 F (36.3 C)  SpO2: 96%  Weight: 201 lb (91.2 kg)   Body mass index is 28.03 kg/m.     04/28/2022    8:30 AM 12/14/2021    8:16 PM 04/16/2021    8:50 AM 04/10/2020    9:03 AM 03/11/2019   10:52 AM 09/19/2017   10:36 AM 08/21/2017    9:47 AM  Advanced Directives  Does Patient Have a Medical Advance Directive? Yes Yes Yes Yes Yes Yes No  Type of AParamedicof ASan YsidroLiving will HEspanolaLiving will Healthcare Power of ACasa de Oro-Mount HelixLiving will   Does patient want to make changes to medical advance directive? No - Patient declined    No - Patient declined No - Patient declined   Copy of [place] Chart? Yes - validated most recent copy scanned in chart (See row information) No - copy requested Yes - validated most recent copy scanned in chart (See row information) Yes - validated most recent copy scanned in chart (See row information) No - copy requested No - copy requested   Would patient like information on creating a medical advance directive? No - Patient declined          Current Medications (verified) Outpatient Encounter Medications as of 04/28/2022  Medication Sig   albuterol (PROAIR HFA) 108 (90 Base) MCG/ACT inhaler INHALE 2 PUFFS EVERY 6 HOURS AS NEEDED FOR WHEEZING OR SHORTNESS OF BREATH   amLODipine (NORVASC) 2.5 MG tablet TAKE 1 TABLET BY MOUTH EVERY DAY   atorvastatin (LIPITOR) 20 MG tablet TAKE 1 TABLET (20 MG TOTAL) BY MOUTH DAILY. PT IS TAKING AT BEDTIME   Azelastine HCl 137  MCG/SPRAY SOLN PLACE 2 SPRAYS INTO EACH NOSTRIL DAILY.   butalbital-acetaminophen-caffeine (FIORICET) 50-325-40 MG tablet TAKE 1 TABLET BY MOUTH DAILY AS NEEDED- MAX 2 PER WEEK. 1 REFILL PER MONTH.   cyclobenzaprine (FLEXERIL) 10 MG tablet TAKE 0.5-1 TABLETS BY MOUTH 3 (THREE) TIMES DAILY AS NEEDED FOR MUSCLE SPASMS.   fexofenadine (ALLEGRA) 180 MG tablet Patient is taking 2 times daily   fluticasone (FLONASE) 50 MCG/ACT nasal spray USE ONE SPRAY IN EACH NOSTRIL AS DIRECTED ONCE DAILY   gabapentin (NEURONTIN) 300 MG capsule Take 3 capsules (900 mg total) by mouth at bedtime. (Patient taking differently: Take 900 mg by mouth as needed.)   hydrochlorothiazide (HYDRODIURIL) 25 MG tablet TAKE 1 TABLET BY MOUTH TWICE A DAY   montelukast (SINGULAIR) 10 MG tablet TAKE 1 TABLET BY MOUTH EVERYDAY AT BEDTIME   omeprazole (PRILOSEC) 40 MG capsule ONE CAPSULE DAILY 30 MINUTES BEFORE BREAKFAST -SCHEDULE APPOINTMENT FOR FURTHER REFILLS   ondansetron (ZOFRAN) 4 MG tablet TAKE 1 TABLET BY MOUTH EVERY 8 HOURS AS NEEDED FOR NAUSEA AND VOMITING   PARoxetine (PAXIL) 20 MG tablet TAKE 1 TABLET BY MOUTH EVERYDAY AT BEDTIME   potassium chloride SA (KLOR-CON M) 20 MEQ tablet Take 1 tablet (20 mEq total) by mouth once a week.   promethazine (PHENERGAN) 12.5 MG tablet Take 1 tablet (12.5 mg total) by mouth daily. As needed  propranolol (INDERAL) 40 MG tablet TAKE 1 TABLET BY MOUTH TWICE A DAY   SYMBICORT 160-4.5 MCG/ACT inhaler TAKE 2 PUFFS BY MOUTH TWICE A DAY   traMADol (ULTRAM) 50 MG tablet TAKE 1 TABLET BY MOUTH DAILY AS NEEDED.   VESICARE 5 MG tablet Take 1 tablet by mouth daily.   vitamin B-12 (CYANOCOBALAMIN) 500 MCG tablet Take 500 mcg by mouth daily.   [DISCONTINUED] ondansetron (ZOFRAN-ODT) 4 MG disintegrating tablet '4mg'$  ODT q4 hours prn nausea/vomit   [DISCONTINUED] promethazine (PHENERGAN) 25 MG suppository Place 1 suppository (25 mg total) rectally every 6 (six) hours as needed for nausea or vomiting.   No  facility-administered encounter medications on file as of 04/28/2022.    Allergies (verified) Propoxyphene hcl   History: Past Medical History:  Diagnosis Date   Allergy    seasonal   Anxiety state, unspecified 09/01/2009   ASTHMA 03/12/2007   Asthma    Cancer ([facility]) 2008   prostate cancer   Gastroparesis 05/01/2009   GERD 04/02/2009   Headache(784.0) 03/07/2007   HIATAL HERNIA 04/28/2009   Hiatal hernia    HYPERLIPIDEMIA 06/11/2007   HYPERTENSION 03/07/2007   Pneumonia    hx of   Polyp of colon, hyperplastic    PROSTATE CANCER, HX OF 03/07/2007   Suspected exposure to asbestos    have asbestosis testing every year   TRANSAMINASES, SERUM, ELEVATED 04/28/2009   Tubular adenoma of colon    Ulcer    as teenager   Past Surgical History:  Procedure Laterality Date   APPENDECTOMY     BACK SURGERY  05/2011   COLONOSCOPY     LUMBAR LAMINECTOMY/DECOMPRESSION MICRODISCECTOMY N/A 10/09/2013   Procedure: L4-5 DECOMPRESSION/FARAMINOTOMY REVISION ;  Surgeon: Melina Schools, MD;  Location: [facility];  Service: Orthopedics;  Laterality: N/A;   PROSTATECTOMY  07/10/06   ROTATOR CUFF REPAIR     9/09 on right   TRANSFORAMINAL LUMBAR INTERBODY FUSION (TLIF) WITH PEDICLE SCREW FIXATION 1 LEVEL N/A 10/09/2013   Procedure: TLIF L5-S1;  Surgeon: Melina Schools, MD;  Location: [facility];  Service: Orthopedics;  Laterality: N/A;   Family History  Problem Relation Age of Onset   Pancreatic cancer Mother 37   Hypertension Father    Heart attack Father        42   Multiple sclerosis Sister    Heart disease Sister    Heart attack Sister    Hypertension Brother        x 2   Hypertension Brother    Kidney disease Brother    Hypertension Brother    Heart attack Maternal Grandmother    Heart attack Paternal Grandmother    Heart attack Paternal Grandfather    Liver cancer Daughter    Hypertension Son    Colon cancer Neg Hx    Rectal cancer Neg Hx    Stomach cancer Neg Hx    Esophageal cancer Neg Hx    Social  History   Socioeconomic History   Marital status: Married    Spouse name: Not on file   Number of children: 4   Years of education: Not on file   Highest education level: Not on file  Occupational History   Occupation: Disabled  Tobacco Use   Smoking status: Former    Packs/day: 0.50    Years: 2.00    Total pack years: 1.00    Types: Cigarettes    Quit date: 04/09/1983    Years since quitting: 39.0   Smokeless tobacco: Never  Vaping Use   Vaping Use: Never used  Substance and Sexual Activity   Alcohol use: No    Alcohol/week: 0.0 standard drinks of alcohol   Drug use: No   Sexual activity: Yes  Other Topics Concern   Not on file  Social History Narrative   Married 1974. Wife-Helen. 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    Lost daughter Otila Kluver at age 71 in 2023- left behind 1 child that now lives with him      Retired from [employer]: hunting, boat (off for 1 year in 2015)   Social Determinants of Health   Financial Resource Strain: Low Risk  (04/28/2022)   Overall Financial Resource Strain (CARDIA)    Difficulty of Paying Living Expenses: Not hard at all  Food Insecurity: No Food Insecurity (04/28/2022)   Hunger Vital Sign    Worried About Running Out of Food in the Last Year: Never true    Ran Out of Food in the Last Year: Never true  Transportation Needs: No Transportation Needs (04/28/2022)   PRAPARE - Hydrologist (Medical): No    Lack of Transportation (Non-Medical): No  Physical Activity: Insufficiently Active (04/28/2022)   Exercise Vital Sign    Days of Exercise per Week: 3 days    Minutes of Exercise per Session: 20 min  Stress: No Stress Concern Present (04/28/2022)   [place]    Feeling of Stress :  Not at all  Social Connections: [place] (04/28/2022)   Social Connection and Isolation Panel [NHANES]    Frequency of Communication with Friends and Family: More than three times a week    Frequency of Social Gatherings with Friends and Family: More than three times a week    Attends Religious Services: More than 4 times per year    Active Member of [employer] or Organizations: Yes    Attends Archivist Meetings: 1 to 4 times per year    Marital Status: Married    Tobacco Counseling Counseling given: Not Answered   Clinical Intake:  Pre-visit preparation completed: Yes  Pain : No/denies pain     BMI - recorded: 28.03 Nutritional Status: BMI 25 -29 Overweight Nutritional Risks: None Diabetes: No  How often do you need to have someone help you when you read instructions, pamphlets, or other written materials from your doctor or pharmacy?: 1 - Never  Diabetic?no  Interpreter Needed?: No  Information entered by :: Charlott Rakes, LPN   Activities of Daily Living    04/28/2022    8:31 AM  In your present state of health, do you have any difficulty performing the following activities:  Hearing? 0  Vision? 0  Difficulty concentrating or making decisions? 0  Walking or climbing stairs? 0  Dressing or bathing? 0  Doing errands, shopping? 0  Preparing Food and eating ? N  Using the Toilet? N  In the past six months, have you accidently leaked urine? N  Do you have problems with loss of bowel control? N  Managing your Medications? N  Managing your Finances? N  Housekeeping or managing your Housekeeping? N    Patient Care Team: Marin Olp, MD as PCP - General (Family Medicine) Harl Bowie  Clayton Bibles, MD as Consulting Physician (Gastroenterology) Raynelle Bring, MD as Consulting Physician (Urology)  Indicate any recent Medical Services you may have received from other than [facility] providers in the past year (date may be approximate).     Assessment:    This is a routine wellness examination for [place].  Hearing/Vision screen Hearing Screening - Comments:: Pt denies any hearing issues  Vision Screening - Comments:: Pt follows up [facility] annual eye exams   Dietary issues and exercise activities discussed: Current Exercise Habits: Home exercise routine, Type of exercise: treadmill, Time (Minutes): 20, Frequency (Times/Week): 3, Weekly Exercise (Minutes/Week): 60   Goals Addressed             This Visit's Progress    Patient Stated       Exercise more        Depression Screen    04/28/2022    8:29 AM 04/16/2021    8:50 AM 10/21/2020    8:32 AM 04/10/2020    9:01 AM 10/22/2019    8:50 AM 04/23/2019    2:30 PM 03/11/2019   10:53 AM  PHQ 2/9 Scores  PHQ - 2 Score 0 0 0 0 0 0 0  PHQ- 9 Score     0 0     Fall Risk    04/28/2022    8:31 AM 04/16/2021    8:51 AM 10/21/2020    8:32 AM 04/10/2020    9:04 AM 10/22/2019    8:50 AM  Fall Risk   Falls in the past year? 0 0 0 0 0  Number falls in past yr: 0 0 0 0 0  Injury with Fall? 0 0 0 0 0  Risk for fall due to : Impaired vision Impaired vision No Fall Risks Impaired vision   Follow up Falls prevention discussed Falls prevention discussed Falls evaluation completed Falls prevention discussed     FALL RISK PREVENTION PERTAINING TO THE HOME:  Any stairs in or around the home? Yes  If so, are there any without handrails? No  Home free of loose throw rugs in walkways, pet beds, electrical cords, etc? Yes  Adequate lighting in your home to reduce risk of falls? Yes   ASSISTIVE DEVICES UTILIZED TO PREVENT FALLS:  Life alert? No  Use of a cane, walker or w/c? Yes  Grab bars in the bathroom? No  Shower chair or bench in shower? No  Elevated toilet seat or a handicapped toilet? No   TIMED UP AND GO:  Was the test performed? Yes .  Length of time to ambulate 10 feet: 10 sec.   Gait steady and fast without use of assistive device  Cognitive Function:         04/28/2022    8:31 AM 04/16/2021    8:55 AM 04/10/2020    9:06 AM 09/19/2017   11:02 AM  6CIT Screen  What Year? 0 points 0 points 0 points 0 points  What month? 0 points 0 points 0 points 0 points  What time? 0 points 0 points  0 points  Count back from 20 0 points 0 points 0 points 0 points  Months in reverse 0 points 0 points 0 points 0 points  Repeat phrase 0 points 2 points 4 points   Total Score 0 points 2 points      Immunizations Immunization History  Administered Date(s) Administered   Fluad Quad(high Dose 65+) 10/22/2018, 01/14/2020, 11/12/2021   H1N1 03/21/2008   Influenza Split 12/31/2010,  11/22/2011, 11/28/2012   Influenza Whole 12/12/2007, 12/11/2008   Influenza, High Dose Seasonal PF 12/07/2016, 12/20/2017, 12/03/2020   Influenza,inj,Quad PF,6+ Mos 12/12/2014, 12/31/2015   PFIZER(Purple Top)SARS-COV-2 Vaccination 03/23/2019, 04/14/2019, 11/28/2019   Pfizer Covid-19 Vaccine Bivalent Booster 13yr & up 12/03/2020   Pneumococcal Conjugate-13 12/07/2016   Pneumococcal Polysaccharide-23 12/20/2017   Td 03/01/1995, 06/17/2008   Tdap 06/11/2007, 08/27/2018   Zoster, Live 01/10/2012    TDAP status: Up to date  Flu Vaccine status: Up to date  Pneumococcal vaccine status: Up to date  Covid-19 vaccine status: Completed vaccines  Qualifies for Shingles Vaccine? Yes   Zostavax completed No   Shingrix Completed?: No.    Education has been provided regarding the importance of this vaccine. Patient has been advised to call insurance company to determine out of pocket expense if they have not yet received this vaccine. Advised may also receive vaccine at local pharmacy or Health Dept. Verbalized acceptance and understanding.  Screening Tests Health Maintenance  Topic Date Due   Zoster Vaccines- Shingrix (1 of 2) Never done   COVID-19 Vaccine (5 - 2023-24 season) 10/29/2021   Medicare Annual Wellness (AWV)  04/28/2023   COLONOSCOPY (Pts 45-410yrInsurance coverage  will need to be confirmed)  12/18/2023   DTaP/Tdap/Td (5 - Td or Tdap) 08/26/2028   Pneumonia Vaccine 6561Years old  Completed   INFLUENZA VACCINE  Completed   Hepatitis C Screening  Completed   HPV VACCINES  Aged Out    Health Maintenance  Health Maintenance Due  Topic Date Due   Zoster Vaccines- Shingrix (1 of 2) Never done   COVID-19 Vaccine (5 - 2023-24 season) 10/29/2021    Colorectal cancer screening: Type of screening: Colonoscopy. Completed 12/17/20. Repeat every 3 years   Additional Screening:  Hepatitis C Screening:  Completed 03/24/09  Vision Screening: Recommended annual ophthalmology exams for early detection of glaucoma and other disorders of the eye. Is the patient up to date with their annual eye exam?  Yes  Who is the provider or what is the name of the office in which the patient attends annual eye exams? [facility]  If pt is not established with a provider, would they like to be referred to a provider to establish care? No .   Dental Screening: Recommended annual dental exams for proper oral hygiene  Community Resource Referral / Chronic Care Management: CRR required this visit?  No   CCM required this visit?  No      Plan:     I have personally reviewed and noted the following in the patient's chart:   Medical and social history Use of alcohol, tobacco or illicit drugs  Current medications and supplements including opioid prescriptions. Patient is currently taking opioid prescriptions. Information provided to patient regarding non-opioid alternatives. Patient advised to discuss non-opioid treatment plan with their provider. Functional ability and status Nutritional status Physical activity Advanced directives List of other physicians Hospitalizations, surgeries, and ER visits in previous 12 months Vitals Screenings to include cognitive, depression, and falls Referrals and appointments  In addition, I have reviewed and discussed with  patient certain preventive protocols, quality metrics, and best practice recommendations. A written personalized care plan for preventive services as well as general preventive health recommendations were provided to patient.     TiWillette BraceLPN   [id number] Nurse Notes: none

## 2022-04-28 NOTE — Patient Instructions (Signed)
[name] , Thank you for taking time to come for your Medicare Wellness Visit. I appreciate your ongoing commitment to your health goals. Please review the following plan we discussed and let me know if I can assist you in the future.   These are the goals we discussed:  Goals      Increase physical activity     Patient currently swimming 2 days a week. Wants to improve free style swimming     Patient Stated     Lose weight      Patient Stated     Patient Stated     Get weight back down      Weight (lb) < 200 lb (90.7 kg)     Patient working to lose weight. Wants to lose 5-10 more pounds        This is a list of the screening recommended for you and due dates:  Health Maintenance  Topic Date Due   Zoster (Shingles) Vaccine (1 of 2) Never done   COVID-19 Vaccine (5 - 2023-24 season) 10/29/2021   Medicare Annual Wellness Visit  04/28/2023   Colon Cancer Screening  12/18/2023   DTaP/Tdap/Td vaccine (5 - Td or Tdap) 08/26/2028   Pneumonia Vaccine  Completed   Flu Shot  Completed   Hepatitis C Screening: USPSTF Recommendation to screen - Ages 18-79 yo.  Completed   HPV Vaccine  Aged Out    Advanced directives: copies in chart   Conditions/risks identified: exercise more   Next appointment: Follow up in one year for your annual wellness visit.   Preventive Care 13 Years and Older, Male  Preventive care refers to lifestyle choices and visits with your health care provider that can promote health and wellness. What does preventive care include? A yearly physical exam. This is also called an annual well check. Dental exams once or twice a year. Routine eye exams. Ask your health care provider how often you should have your eyes checked. Personal lifestyle choices, including: Daily care of your teeth and gums. Regular physical activity. Eating a healthy diet. Avoiding tobacco and drug use. Limiting alcohol use. Practicing safe sex. Taking low doses of aspirin every  day. Taking vitamin and mineral supplements as recommended by your health care provider. What happens during an annual well check? The services and screenings done by your health care provider during your annual well check will depend on your age, overall health, lifestyle risk factors, and family history of disease. Counseling  Your health care provider may ask you questions about your: Alcohol use. Tobacco use. Drug use. Emotional well-being. Home and relationship well-being. Sexual activity. Eating habits. History of falls. Memory and ability to understand (cognition). Work and work Statistician. Screening  You may have the following tests or measurements: Height, weight, and BMI. Blood pressure. Lipid and cholesterol levels. These may be checked every 5 years, or more frequently if you are over 70 years old. Skin check. Lung cancer screening. You may have this screening every year starting at age 53 if you have a 30-pack-year history of smoking and currently smoke or have quit within the past 15 years. Fecal occult blood test (FOBT) of the stool. You may have this test every year starting at age 42. Flexible sigmoidoscopy or colonoscopy. You may have a sigmoidoscopy every 5 years or a colonoscopy every 10 years starting at age 72. Prostate cancer screening. Recommendations will vary depending on your family history and other risks. Hepatitis C blood test. Hepatitis B  blood test. Sexually transmitted disease (STD) testing. Diabetes screening. This is done by checking your blood sugar (glucose) after you have not eaten for a while (fasting). You may have this done every 1-3 years. Abdominal aortic aneurysm (AAA) screening. You may need this if you are a current or former smoker. Osteoporosis. You may be screened starting at age 33 if you are at high risk. Talk with your health care provider about your test results, treatment options, and if necessary, the need for more  tests. Vaccines  Your health care provider may recommend certain vaccines, such as: Influenza vaccine. This is recommended every year. Tetanus, diphtheria, and acellular pertussis (Tdap, Td) vaccine. You may need a Td booster every 10 years. Zoster vaccine. You may need this after age 107. Pneumococcal 13-valent conjugate (PCV13) vaccine. One dose is recommended after age 84. Pneumococcal polysaccharide (PPSV23) vaccine. One dose is recommended after age 94. Talk to your health care provider about which screenings and vaccines you need and how often you need them. This information is not intended to replace advice given to you by your health care provider. Make sure you discuss any questions you have with your health care provider. Document Released: 03/13/2015 Document Revised: 11/04/2015 Document Reviewed: 12/16/2014 Elsevier Interactive Patient Education  2017 [employer] Prevention in the Home Falls can cause injuries. They can happen to people of all ages. There are many things you can do to make your home safe and to help prevent falls. What can I do on the outside of my home? Regularly fix the edges of walkways and driveways and fix any cracks. Remove anything that might make you trip as you walk through a door, such as a raised step or threshold. Trim any bushes or trees on the path to your home. Use bright outdoor lighting. Clear any walking paths of anything that might make someone trip, such as rocks or tools. Regularly check to see if handrails are loose or broken. Make sure that both sides of any steps have handrails. Any raised decks and porches should have guardrails on the edges. Have any leaves, snow, or ice cleared regularly. Use sand or salt on walking paths during winter. Clean up any spills in your garage right away. This includes oil or grease spills. What can I do in the bathroom? Use night lights. Install grab bars by the toilet and in the tub and shower.  Do not use towel bars as grab bars. Use non-skid mats or decals in the tub or shower. If you need to sit down in the shower, use a plastic, non-slip stool. Keep the floor dry. Clean up any water that spills on the floor as soon as it happens. Remove soap buildup in the tub or shower regularly. Attach bath mats securely with double-sided non-slip rug tape. Do not have throw rugs and other things on the floor that can make you trip. What can I do in the bedroom? Use night lights. Make sure that you have a light by your bed that is easy to reach. Do not use any sheets or blankets that are too big for your bed. They should not hang down onto the floor. Have a firm chair that has side arms. You can use this for support while you get dressed. Do not have throw rugs and other things on the floor that can make you trip. What can I do in the kitchen? Clean up any spills right away. Avoid walking on wet floors. Keep items  that you use a lot in easy-to-reach places. If you need to reach something above you, use a strong step stool that has a grab bar. Keep electrical cords out of the way. Do not use floor polish or wax that makes floors slippery. If you must use wax, use non-skid floor wax. Do not have throw rugs and other things on the floor that can make you trip. What can I do with my stairs? Do not leave any items on the stairs. Make sure that there are handrails on both sides of the stairs and use them. Fix handrails that are broken or loose. Make sure that handrails are as long as the stairways. Check any carpeting to make sure that it is firmly attached to the stairs. Fix any carpet that is loose or worn. Avoid having throw rugs at the top or bottom of the stairs. If you do have throw rugs, attach them to the floor with carpet tape. Make sure that you have a light switch at the top of the stairs and the bottom of the stairs. If you do not have them, ask someone to add them for you. What else  can I do to help prevent falls? Wear shoes that: Do not have high heels. Have rubber bottoms. Are comfortable and fit you well. Are closed at the toe. Do not wear sandals. If you use a stepladder: Make sure that it is fully opened. Do not climb a closed stepladder. Make sure that both sides of the stepladder are locked into place. Ask someone to hold it for you, if possible. Clearly mark and make sure that you can see: Any grab bars or handrails. First and last steps. Where the edge of each step is. Use tools that help you move around (mobility aids) if they are needed. These include: Canes. Walkers. Scooters. Crutches. Turn on the lights when you go into a dark area. Replace any light bulbs as soon as they burn out. Set up your furniture so you have a clear path. Avoid moving your furniture around. If any of your floors are uneven, fix them. If there are any pets around you, be aware of where they are. Review your medicines with your doctor. Some medicines can make you feel dizzy. This can increase your chance of falling. Ask your doctor what other things that you can do to help prevent falls. This information is not intended to replace advice given to you by your health care provider. Make sure you discuss any questions you have with your health care provider. Document Released: 12/11/2008 Document Revised: 07/23/2015 Document Reviewed: 03/21/2014 Elsevier Interactive Patient Education  2017 [employer].

## 2022-05-13 ENCOUNTER — Encounter: Payer: Self-pay | Admitting: Family Medicine

## 2022-05-13 ENCOUNTER — Ambulatory Visit (INDEPENDENT_AMBULATORY_CARE_PROVIDER_SITE_OTHER): Payer: Medicare HMO | Admitting: Family Medicine

## 2022-05-13 VITALS — BP 136/76 | HR 72 | Temp 97.9°F | Ht 71.0 in | Wt 200.2 lb

## 2022-05-13 DIAGNOSIS — J61 Pneumoconiosis due to asbestos and other mineral fibers: Secondary | ICD-10-CM

## 2022-05-13 DIAGNOSIS — Z79899 Other long term (current) drug therapy: Secondary | ICD-10-CM

## 2022-05-13 DIAGNOSIS — I700 Atherosclerosis of aorta: Secondary | ICD-10-CM | POA: Diagnosis not present

## 2022-05-13 DIAGNOSIS — I10 Essential (primary) hypertension: Secondary | ICD-10-CM

## 2022-05-13 DIAGNOSIS — E785 Hyperlipidemia, unspecified: Secondary | ICD-10-CM

## 2022-05-13 LAB — COMPREHENSIVE METABOLIC PANEL
ALT: 26 U/L (ref 0–53)
AST: 20 U/L (ref 0–37)
Albumin: 3.9 g/dL (ref 3.5–5.2)
Alkaline Phosphatase: 83 U/L (ref 39–117)
BUN: 9 mg/dL (ref 6–23)
CO2: 30 mEq/L (ref 19–32)
Calcium: 9.4 mg/dL (ref 8.4–10.5)
Chloride: 102 mEq/L (ref 96–112)
Creatinine, Ser: 0.74 mg/dL (ref 0.40–1.50)
GFR: 91.7 mL/min (ref >60.00)
Glucose, Bld: 91 mg/dL (ref 70–99)
Potassium: 3.8 mEq/L (ref 3.5–5.1)
Sodium: 140 mEq/L (ref 135–145)
Total Bilirubin: 0.6 mg/dL (ref 0.2–1.2)
Total Protein: 6.4 g/dL (ref 6.0–8.3)

## 2022-05-13 LAB — VITAMIN B12: Vitamin B-12: 520 pg/mL (ref 211–911)

## 2022-05-13 MED ORDER — PREDNISONE 20 MG PO TABS: ORAL_TABLET | ORAL | 0 refills | Status: DC

## 2022-05-13 MED ORDER — TRAMADOL HCL 50 MG PO TABS: 50.0000 mg | ORAL_TABLET | Freq: Every day | ORAL | 5 refills | Status: DC | PRN

## 2022-05-13 NOTE — Patient Instructions (Addendum)
Health Maintenance Due  Topic Date Due   Zoster Vaccines- Shingrix (1 of 2) Never done  Please check with your pharmacy to see if they have the shingrix vaccine. If they do- please get this immunization and update [place] by phone call or mychart with dates you receive the vaccine   Asthma- poor control after covid- some wheeze and increased cough from baseline- we opted to do short prednisone course to calm this down . If albuterol usage does not decrease to twice a week after this please schedule follow up   Please stop by lab before you go If you have mychart- we will send your results within 3 business days of [place] receiving them.  If you do not have mychart- we will call you about results within 5 business days of [place] receiving them.  *please also note that you will see labs on mychart as soon as they post. I will later go in and write notes on them- will say "notes from Dr. Yong Channel"   Recommended follow up: Return in about 6 months (around 11/13/2022) for physical or sooner if needed.Schedule b4 you leave.

## 2022-05-13 NOTE — Progress Notes (Signed)
Phone [phone] In person visit   Subjective:   Mark [name] is a 72 y.o. year old very pleasant male patient who presents for/with See problem oriented charting Chief Complaint  Patient presents with   Follow-up    (No mask)   Hypertension   Hyperlipidemia   Past Medical History-  Patient Active Problem List   Diagnosis Date Noted   Asbestosis ([place]) 02/05/2016    Priority: High   Back pain 10/09/2013    Priority: High   Anxiety state 09/01/2009    Priority: High   Migraine headache 03/07/2007    Priority: High   Aortic atherosclerosis ([place]) 03/25/2021    Priority: Medium    Gastroparesis 05/01/2009    Priority: Medium    GERD 04/02/2009    Priority: Medium    Hyperlipidemia 06/11/2007    Priority: Medium    Asthma 03/12/2007    Priority: Medium    Essential hypertension 03/07/2007    Priority: Medium    PROSTATE CANCER, HX OF 03/07/2007    Priority: Medium    Allergic rhinitis 11/22/2013    Priority: Low   Overweight 01/10/2012    Priority: Low   HIATAL HERNIA 04/28/2009    Priority: Low   History of adenomatous polyp of colon 09/08/2017    Medications- reviewed and updated Current Outpatient Medications  Medication Sig Dispense Refill   albuterol (PROAIR HFA) 108 (90 Base) MCG/ACT inhaler INHALE 2 PUFFS EVERY 6 HOURS AS NEEDED FOR WHEEZING OR SHORTNESS OF BREATH 76.5 g 3   amLODipine (NORVASC) 2.5 MG tablet TAKE 1 TABLET BY MOUTH EVERY DAY 90 tablet 1   atorvastatin (LIPITOR) 20 MG tablet TAKE 1 TABLET (20 MG TOTAL) BY MOUTH DAILY. PT IS TAKING AT BEDTIME 90 tablet 3   Azelastine HCl 137 MCG/SPRAY SOLN PLACE 2 SPRAYS INTO EACH NOSTRIL DAILY. 30 mL 3   butalbital-acetaminophen-caffeine (FIORICET) 50-325-40 MG tablet TAKE 1 TABLET BY MOUTH DAILY AS NEEDED- MAX 2 PER WEEK. 1 REFILL PER MONTH. 10 tablet 5   cyclobenzaprine (FLEXERIL) 10 MG tablet TAKE 0.5-1 TABLETS BY MOUTH 3 (THREE) TIMES DAILY AS NEEDED FOR MUSCLE SPASMS. 30 tablet 5   fexofenadine  (ALLEGRA) 180 MG tablet Patient is taking 2 times daily 90 tablet 1   fluticasone (FLONASE) 50 MCG/ACT nasal spray USE ONE SPRAY IN EACH NOSTRIL AS DIRECTED ONCE DAILY 48 mL 3   gabapentin (NEURONTIN) 300 MG capsule Take 3 capsules (900 mg total) by mouth at bedtime. (Patient taking differently: Take 900 mg by mouth as needed.) 30 capsule 1   hydrochlorothiazide (HYDRODIURIL) 25 MG tablet TAKE 1 TABLET BY MOUTH TWICE A DAY 180 tablet 3   montelukast (SINGULAIR) 10 MG tablet TAKE 1 TABLET BY MOUTH EVERYDAY AT BEDTIME 90 tablet 3   omeprazole (PRILOSEC) 40 MG capsule ONE CAPSULE DAILY 30 MINUTES BEFORE BREAKFAST -SCHEDULE APPOINTMENT FOR FURTHER REFILLS 90 capsule 1   PARoxetine (PAXIL) 20 MG tablet TAKE 1 TABLET BY MOUTH EVERYDAY AT BEDTIME 90 tablet 1   potassium chloride SA (KLOR-CON M) 20 MEQ tablet Take 1 tablet (20 mEq total) by mouth once a week. 13 tablet 3   predniSONE (DELTASONE) 20 MG tablet Take 2 pills for 3 days, 1 pill for 4 days 10 tablet 0   promethazine (PHENERGAN) 12.5 MG tablet Take 1 tablet (12.5 mg total) by mouth daily. As needed 30 tablet 3   propranolol (INDERAL) 40 MG tablet TAKE 1 TABLET BY MOUTH TWICE A DAY 180 tablet 3   SYMBICORT  160-4.5 MCG/ACT inhaler TAKE 2 PUFFS BY MOUTH TWICE A DAY 10.2 each 11   VESICARE 5 MG tablet Take 1 tablet by mouth daily.     vitamin B-12 (CYANOCOBALAMIN) 500 MCG tablet Take 500 mcg by mouth daily.     ondansetron (ZOFRAN) 4 MG tablet TAKE 1 TABLET BY MOUTH EVERY 8 HOURS AS NEEDED FOR NAUSEA AND VOMITING (Patient not taking: Reported on 05/13/2022) 30 tablet 3   traMADol (ULTRAM) 50 MG tablet Take 1 tablet (50 mg total) by mouth daily as needed. 30 tablet 5   No current facility-administered medications for this visit.     Objective:  BP 136/76   Pulse 72   Temp 97.9 F (36.6 C)   Ht 5\' 11"  (1.803 m)   Wt 200 lb 3.2 oz (90.8 kg)   SpO2 95%   BMI 27.92 kg/m  Gen: NAD, resting comfortably CV: RRR no murmurs rubs or gallops Lungs:  CTAB no crackles, wheeze, rhonchi Ext: no edema Skin: warm, dry     Assessment and Plan   #social update- lost daughter in 2023 to liver cancer- caring for 66 year old grandson now. genetic testing and was told would only have passed down to a daughter.   #Asbestosis class II-follows with Dr. Charlett Blake -recent visit #Asthma S: Medication: Symbicort for maintenance added August 2021 in addition to Singulair 10 mg, albuterol as needed- with covid needed more- up to twice a day lately  -he states just had visit  and they are faxing records. Reports the aorta was wide but diffue- not clearly aneurysm- we will be on lookout for report -has some residual cough after covid last month A/P: asbestosis- reportedly stable- will await records Asthma- poor control after covid- some wheeze and increased cough from baseline- we opted to do short prednisone course to calm this down . If albuterol usage does not decrease to twice a week after this please schedule follow up   #History of prostate cancer-robotic prostatectomy with Dr. Alinda Money in the past.  Continues to follow with urology last visit aug 2023 - yearly follow up  -For overactive bladder patient is on Vesicare  #hypertension S: medication: Propanolol 40mg  twice daily, hydrochlorothiazide 25 mg amlodipine 2.5Mg  Home readings #s: can be as low as 110 - highest up to 140 BP Readings from Last 3 Encounters:  05/13/22 136/76  04/28/22 130/80  04/04/22 (!) 155/91  A/P: reasonable control in office- continue current medications - gets lower at home at times so do not think we should increase medicine as this would increase risk of falls/injury/presyncope  #hyperlipidemia-LDL has been below 70 S: Medication:Atorvastatin 20Mg  Lab Results  Component Value Date   CHOL 123 11/12/2021   HDL 44 11/12/2021   LDLCALC 60 11/12/2021   LDLDIRECT 75.0 08/07/2015   TRIG 107 11/12/2021   CHOLHDL 2.8 11/12/2021   A/P: very good control- continue  current medications   # Anxiety S:Medication: Paxil 20 mg Counseling: not at present- talks to pastor for support Suicidal ideation: none  A/P: reasonable control of anxiety- continue current medications    #Chronic back pain-in the past patient was on Percocet but later weaned to Mobic and Valium and gabapentin and tramadol -has to be careful with walking distances -Currently on Flexeril 10 mg up to 3 times a day as needed, gabapentin 300 mg at bedtime, tramadol daily as needed  #Migraines S: Prophylaxis: Propranolol for blood pressure control but also likely helps reduce frequency of migraines. -Fioricet uses typically twice  a week-lately about the same and medicine still works  A/P: stable- continue current medicines    # GERD S:Medication: Prilosec 40Mg  daily.  Rare poor control intake second dose  B12 levels related to PPI use: Low normal-he takes a B12 supplement A/P: stable- continue current medicines    #Allergies-Astelin helpful along with Singulair 10 mg- reasonable control  Recommended follow up: Return in about 6 months (around 11/13/2022) for physical or sooner if needed.Schedule b4 you leave. Future Appointments  Date Time Provider [place]  07/18/2022  9:50 AM Mauri Pole, MD [facility]  05/16/2023  8:00 AM [facility] ANNUAL WELLNESS VISIT 1 [facility]    Lab/Order associations:   ICD-10-CM   1. Essential hypertension  I10     2. Aortic atherosclerosis (H[facility]) Chronic I70.0     3. Asbestosis ([place]) Chronic J61     4. Hyperlipidemia, unspecified hyperlipidemia type  E78.5       Meds ordered this encounter  Medications   traMADol (ULTRAM) 50 MG tablet    Sig: Take 1 tablet (50 mg total) by mouth daily as needed.    Dispense:  30 tablet    Refill:  5    This request is for a new prescription for a controlled substance as required by Federal/State law..   predniSONE (DELTASONE) 20 MG tablet    Sig: Take 2 pills for 3 days, 1 pill for 4  days    Dispense:  10 tablet    Refill:  0    Return precautions advised.  Garret Reddish, MD

## 2022-05-14 ENCOUNTER — Other Ambulatory Visit: Payer: Self-pay | Admitting: Family Medicine

## 2022-06-02 ENCOUNTER — Other Ambulatory Visit: Payer: Self-pay | Admitting: Gastroenterology

## 2022-06-03 ENCOUNTER — Telehealth: Payer: Self-pay | Admitting: Gastroenterology

## 2022-06-03 MED ORDER — ONDANSETRON HCL 4 MG PO TABS: ORAL_TABLET | ORAL | 3 refills | Status: DC

## 2022-06-03 NOTE — Telephone Encounter (Signed)
PT is calling to get a refill for Zofran. He is scheduled for a FU in May but will run out of medication before then appointment. Should be sent to [facility]

## 2022-06-03 NOTE — Telephone Encounter (Signed)
Zofran sent to pharmacy Patient must keep May appointment

## 2022-07-15 NOTE — Progress Notes (Unsigned)
Mark [name]    [id number]    1951-01-05  Primary Care Physician:[name], Aldine Contes, MD  Referring Physician: Shelva Majestic, MD [address] [place],  [place] [address]   Chief complaint: History of colon polyps  HPI: 72 year old very pleasant gentleman here for follow-up visit for dysphagia, GERD and chronic nausea Overall his symptoms have improved after EGD with esophageal dilation. He was seen in the ED on 12-14-21 for nausea, vomiting, and diarrhea most likely due to food poisoning and was treated with Zofran injections.    I last saw her on 11-26-20. At that time, he continue to experience occasional nausea. His excessive bloating and belching had improved.    Today,   GI Hx:  CT ABDOMEN WITHOUT AND WITH CONTRAST 03-24-21 1. Benign Bosniak II upper pole left renal cyst. No finding in the lower pole left kidney to correspond to the questioned abnormality on recent ultrasound. 2.  Aortic atherosclerosis (ICD10-I70.0).  Colonoscopy 12-17-20 - Four 4 to 6 mm polyps in the descending colon, in the transverse colon and in the ascending colon, removed with a cold snare. Resected and retrieved.  - Non-bleeding external and internal hemorrhoids. 1. Surgical [P], colon, transverse and ascending, polyp (3) - TUBULAR ADENOMA(S) WITHOUT HIGH GRADE DYSPLASIA. 2. Surgical [P], colon, descending, polyp (1) - TUBULAR ADENOMA WITHOUT HIGH GRADE DYSPLASIA.  EGD Jun 29, 2020 - LA Grade B reflux esophagitis with no bleeding. - 3 cm hiatal hernia. - Benign-appearing esophageal stenosis. Dilated.  EGD 04/08/20 - The lumen of the lower third of the esophagus was mildly dilated with stasis of secretions. - Esophagitis was found 25 to 38 cm from the incisors. Biopsies were taken with a cold forceps for histology, showed evidence of Candida esophagitis - A 5 cm hiatal hernia was present. - Patchy mild inflammation characterized by congestion (edema), erosions and  erythema was found in the gastric body, in the gastric antrum and in the prepyloric region of the stomach. Biopsies were taken with a cold forceps for Helicobacter pylori testing. - The examined duodenum was normal.   4 hours gastric emptying scan 2018: Normal HIDA scan: Normal 65% gallbladder ejection fraction EGD February 2011: Mild gastritis and esophagitis otherwise normal exam Colonoscopy 2014: Removal of tubular adenoma Colonoscopy August 2019 with removal of 6 sessile polyps [tubular adenoma and sessile serrated adenomas], recall colonoscopy in 3 years.    Current Outpatient Medications:    albuterol (PROAIR HFA) 108 (90 Base) MCG/ACT inhaler, INHALE 2 PUFFS EVERY 6 HOURS AS NEEDED FOR WHEEZING OR SHORTNESS OF BREATH, Disp: 76.5 g, Rfl: 3   amLODipine (NORVASC) 2.5 MG tablet, TAKE 1 TABLET BY MOUTH EVERY DAY, Disp: 90 tablet, Rfl: 1   atorvastatin (LIPITOR) 20 MG tablet, TAKE 1 TABLET (20 MG TOTAL) BY MOUTH DAILY. PT IS TAKING AT BEDTIME, Disp: 90 tablet, Rfl: 3   Azelastine HCl 137 MCG/SPRAY SOLN, PLACE 2 SPRAYS INTO EACH NOSTRIL DAILY., Disp: 30 mL, Rfl: 3   butalbital-acetaminophen-caffeine (FIORICET) 50-325-40 MG tablet, TAKE 1 TABLET BY MOUTH DAILY AS NEEDED- MAX 2 PER WEEK. 1 REFILL PER MONTH., Disp: 10 tablet, Rfl: 5   cyclobenzaprine (FLEXERIL) 10 MG tablet, TAKE 0.5-1 TABLETS BY MOUTH 3 (THREE) TIMES DAILY AS NEEDED FOR MUSCLE SPASMS., Disp: 30 tablet, Rfl: 5   fexofenadine (ALLEGRA) 180 MG tablet, Patient is taking 2 times daily, Disp: 90 tablet, Rfl: 1   fluticasone (FLONASE) 50 MCG/ACT nasal spray, USE ONE SPRAY  IN EACH NOSTRIL AS DIRECTED ONCE DAILY, Disp: 48 mL, Rfl: 3   gabapentin (NEURONTIN) 300 MG capsule, Take 3 capsules (900 mg total) by mouth at bedtime. (Patient taking differently: Take 900 mg by mouth as needed.), Disp: 30 capsule, Rfl: 1   hydrochlorothiazide (HYDRODIURIL) 25 MG tablet, TAKE 1 TABLET BY MOUTH TWICE A DAY, Disp: 180 tablet, Rfl: 3   montelukast  (SINGULAIR) 10 MG tablet, TAKE 1 TABLET BY MOUTH EVERYDAY AT BEDTIME, Disp: 90 tablet, Rfl: 3   omeprazole (PRILOSEC) 40 MG capsule, ONE CAPSULE DAILY 30 MINUTES BEFORE BREAKFAST -SCHEDULE APPOINTMENT FOR FURTHER REFILLS, Disp: 90 capsule, Rfl: 1   ondansetron (ZOFRAN) 4 MG tablet, TAKE 1 TABLET BY MOUTH EVERY 8 HOURS AS NEEDED FOR NAUSEA AND VOMITING, Disp: 30 tablet, Rfl: 3   PARoxetine (PAXIL) 20 MG tablet, TAKE 1 TABLET BY MOUTH EVERYDAY AT BEDTIME, Disp: 90 tablet, Rfl: 1   potassium chloride SA (KLOR-CON M) 20 MEQ tablet, Take 1 tablet (20 mEq total) by mouth once a week., Disp: 13 tablet, Rfl: 3   promethazine (PHENERGAN) 12.5 MG tablet, Take 1 tablet (12.5 mg total) by mouth daily. As needed, Disp: 30 tablet, Rfl: 3   propranolol (INDERAL) 40 MG tablet, TAKE 1 TABLET BY MOUTH TWICE A DAY, Disp: 180 tablet, Rfl: 3   SYMBICORT 160-4.5 MCG/ACT inhaler, TAKE 2 PUFFS BY MOUTH TWICE A DAY, Disp: 10.2 each, Rfl: 11   traMADol (ULTRAM) 50 MG tablet, Take 1 tablet (50 mg total) by mouth daily as needed., Disp: 30 tablet, Rfl: 5   VESICARE 5 MG tablet, Take 1 tablet by mouth daily., Disp: , Rfl:    vitamin B-12 (CYANOCOBALAMIN) 500 MCG tablet, Take 500 mcg by mouth daily., Disp: , Rfl:     Allergies as of 07/18/2022 - Review Complete 05/13/2022  Allergen Reaction Noted   Propoxyphene hcl Nausea And Vomiting 03/27/2006    Past Medical History:  Diagnosis Date   Allergy    seasonal   Anxiety state, unspecified 09/01/2009   ASTHMA 03/12/2007   Asthma    Cancer (HCC) 2008   prostate cancer   Gastroparesis 05/01/2009   GERD 04/02/2009   Headache(784.0) 03/07/2007   HIATAL HERNIA 04/28/2009   Hiatal hernia    HYPERLIPIDEMIA 06/11/2007   HYPERTENSION 03/07/2007   Pneumonia    hx of   Polyp of colon, hyperplastic    PROSTATE CANCER, HX OF 03/07/2007   Suspected exposure to asbestos    have asbestosis testing every year   TRANSAMINASES, SERUM, ELEVATED 04/28/2009   Tubular adenoma of colon    Ulcer     as teenager    Past Surgical History:  Procedure Laterality Date   APPENDECTOMY     BACK SURGERY  05/2011   COLONOSCOPY     LUMBAR LAMINECTOMY/DECOMPRESSION MICRODISCECTOMY N/A 10/09/2013   Procedure: L4-5 DECOMPRESSION/FARAMINOTOMY REVISION ;  Surgeon: Venita Lick, MD;  Location: [facility] OR;  Service: Orthopedics;  Laterality: N/A;   PROSTATECTOMY  07/10/06   ROTATOR CUFF REPAIR     9/09 on right   TRANSFORAMINAL LUMBAR INTERBODY FUSION (TLIF) WITH PEDICLE SCREW FIXATION 1 LEVEL N/A 10/09/2013   Procedure: TLIF L5-S1;  Surgeon: Venita Lick, MD;  Location: [facility] OR;  Service: Orthopedics;  Laterality: N/A;    Family History  Problem Relation Age of Onset   Pancreatic cancer Mother 51   Hypertension Father    Heart attack Father        49   Multiple sclerosis Sister  Heart disease Sister    Heart attack Sister    Hypertension Brother        x 2   Hypertension Brother    Kidney disease Brother    Hypertension Brother    Heart attack Maternal Grandmother    Heart attack Paternal Grandmother    Heart attack Paternal Grandfather    Liver cancer Daughter    Hypertension Son    Colon cancer Neg Hx    Rectal cancer Neg Hx    Stomach cancer Neg Hx    Esophageal cancer Neg Hx     Social History   Socioeconomic History   Marital status: Married    Spouse name: Not on file   Number of children: 4   Years of education: Not on file   Highest education level: Not on file  Occupational History   Occupation: Disabled  Tobacco Use   Smoking status: Former    Packs/day: 0.50    Years: 2.00    Additional pack years: 0.00    Total pack years: 1.00    Types: Cigarettes    Quit date: 04/09/1983    Years since quitting: 39.2   Smokeless tobacco: Never  Vaping Use   Vaping Use: Never used  Substance and Sexual Activity   Alcohol use: No    Alcohol/week: 0.0 standard drinks of alcohol   Drug use: No   Sexual activity: Yes  Other Topics Concern   Not on file  Social History  Narrative   Married 1974. Wife-Helen. 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    Lost daughter Inetta Fermo at age 81 in 2023- left behind 1 child that now lives with him      Retired from [employer]      Hobbies: hunting, boat (off for 1 year in 2015)   Social Determinants of Health   Financial Resource Strain: Low Risk  (04/28/2022)   Overall Financial Resource Strain (CARDIA)    Difficulty of Paying Living Expenses: Not hard at all  Food Insecurity: No Food Insecurity (04/28/2022)   Hunger Vital Sign    Worried About Running Out of Food in the Last Year: Never true    Ran Out of Food in the Last Year: Never true  Transportation Needs: No Transportation Needs (04/28/2022)   PRAPARE - Administrator, Civil Service (Medical): No    Lack of Transportation (Non-Medical): No  Physical Activity: Insufficiently Active (04/28/2022)   Exercise Vital Sign    Days of Exercise per Week: 3 days    Minutes of Exercise per Session: 20 min  Stress: No Stress Concern Present (04/28/2022)   [employer] of Occupational Health - Occupational Stress Questionnaire    Feeling of Stress : Not at all  Social Connections: Socially Integrated (04/28/2022)   Social Connection and Isolation Panel [NHANES]    Frequency of Communication with Friends and Family: More than three times a week    Frequency of Social Gatherings with Friends and Family: More than three times a week    Attends Religious Services: More than 4 times per year    Active Member of [employer] or Organizations: Yes    Attends Banker Meetings: 1 to 4 times per year    Marital Status: Married  Catering manager Violence: Not At Risk (04/28/2022)  Humiliation, Afraid, Rape, and Kick questionnaire    Fear of Current or Ex-Partner: No    Emotionally Abused: No    Physically  Abused: No    Sexually Abused: No      Review of systems: Review of Systems    Physical Exam: There were no vitals filed for this visit.  There is no height or weight on file to calculate BMI. General: well-appearing ***  Eyes: sclera anicteric, no redness ENT: oral mucosa moist without lesions, no cervical or supraclavicular lymphadenopathy CV: RRR, no JVD, no peripheral edema Resp: clear to auscultation bilaterally, normal RR and effort noted GI: soft, no tenderness, with active bowel sounds. No guarding or palpable organomegaly noted. Skin; warm and dry, no rash or jaundice noted Neuro: awake, alert and oriented x 3. Normal gross motor function and fluent speech   Data Reviewed:  Reviewed labs, radiology imaging, old records and pertinent past GI work up   Assessment and Plan/Recommendations:  72 year old very pleasant gentleman with history of hypertension, hyperlipidemia, anxiety disorder, hiatal hernia and chronic GERD   GERD and hiatal hernia: Continue omeprazole daily and antireflux measures   Dysphagia and odynophagia: Improved s/p EGD with esophageal dilation and treatment of Candida esophagitis with Diflucan    History of advanced adenomatous colon polyps: Due for surveillance colonoscopy, will schedule it The risks and benefits as well as alternatives of endoscopic procedure(s) have been discussed and reviewed. All questions answered. The patient agrees to proceed.  Chronic nausea: Multifactorial, overall improving Use Zofran 4 mg daily as needed for severe nausea episodes  Abdominal bloating and lactose intolerance: Continue to avoid milk and dairy products or use Lactaid tablets as needed  Return in 3 months or sooner if needed  The patient was provided an opportunity to ask questions and all were answered. The patient agreed with the plan and demonstrated an understanding of the instructions.   I,Safa M Kadhim,acting as a scribe for Marsa Aris,  MD.,have documented all relevant documentation on the behalf of Marsa Aris, MD,as directed by  Marsa Aris, MD while in the presence of Marsa Aris, MD.   I, Marsa Aris, MD, have reviewed all documentation for this visit. The documentation on 07/15/22 for the exam, diagnosis, procedures, and orders are all accurate and complete.   Iona Beard , MD    CC: Shelva Majestic, MD

## 2022-07-17 ENCOUNTER — Other Ambulatory Visit: Payer: Self-pay | Admitting: Family Medicine

## 2022-07-18 ENCOUNTER — Ambulatory Visit: Payer: Medicare HMO | Admitting: Gastroenterology

## 2022-07-18 ENCOUNTER — Encounter: Payer: Self-pay | Admitting: Gastroenterology

## 2022-07-18 VITALS — BP 130/80 | HR 68 | Ht 69.0 in | Wt 204.1 lb

## 2022-07-18 DIAGNOSIS — K449 Diaphragmatic hernia without obstruction or gangrene: Secondary | ICD-10-CM

## 2022-07-18 DIAGNOSIS — R110 Nausea: Secondary | ICD-10-CM | POA: Diagnosis not present

## 2022-07-18 DIAGNOSIS — K219 Gastro-esophageal reflux disease without esophagitis: Secondary | ICD-10-CM | POA: Diagnosis not present

## 2022-07-18 DIAGNOSIS — R140 Abdominal distension (gaseous): Secondary | ICD-10-CM | POA: Diagnosis not present

## 2022-07-18 MED ORDER — FAMOTIDINE 20 MG PO TABS: 20.0000 mg | ORAL_TABLET | Freq: Every day | ORAL | 3 refills | Status: DC

## 2022-07-18 MED ORDER — OMEPRAZOLE 40 MG PO CPDR: DELAYED_RELEASE_CAPSULE | ORAL | 3 refills | Status: DC

## 2022-07-18 MED ORDER — ONDANSETRON HCL 4 MG PO TABS: ORAL_TABLET | ORAL | 3 refills | Status: DC

## 2022-07-18 MED ORDER — PROMETHAZINE HCL 12.5 MG PO TABS: 12.5000 mg | ORAL_TABLET | Freq: Every day | ORAL | 3 refills | Status: AC

## 2022-07-18 NOTE — Patient Instructions (Addendum)
We have sent the following medications to your pharmacy for you to pick up at your convenience:  Omeprazole, Zofran, Pepcid  Use Lactaid,Gas-x    FDgard and IBgard Over the counter three times a day  Follow up in 1 year  _______________________________________________________  If your blood pressure at your visit was 140/90 or greater, please contact your primary care physician to follow up on this.  _______________________________________________________  If you are age 103 or older, your body mass index should be between 23-30. Your Body mass index is 30.14 kg/m. If this is out of the aforementioned range listed, please consider follow up with your Primary Care Provider.  If you are age 47 or younger, your body mass index should be between 19-25. Your Body mass index is 30.14 kg/m. If this is out of the aformentioned range listed, please consider follow up with your Primary Care Provider.   ________________________________________________________  The [facility] providers would like to encourage you to use [facility] to communicate with providers for non-urgent requests or questions.  Due to long hold times on the telephone, sending your provider a message by [facility] may be a faster and more efficient way to get a response.  Please allow 48 business hours for a response.  Please remember that this is for non-urgent requests.  _______________________________________________________   I appreciate the  opportunity to care for you  Thank You   Marsa Aris , MD

## 2022-07-20 ENCOUNTER — Encounter: Payer: Self-pay | Admitting: Gastroenterology

## 2022-08-08 ENCOUNTER — Other Ambulatory Visit: Payer: Self-pay | Admitting: Family Medicine

## 2022-08-12 DIAGNOSIS — H43393 Other vitreous opacities, bilateral: Secondary | ICD-10-CM | POA: Diagnosis not present

## 2022-08-12 DIAGNOSIS — H524 Presbyopia: Secondary | ICD-10-CM | POA: Diagnosis not present

## 2022-08-12 DIAGNOSIS — H2513 Age-related nuclear cataract, bilateral: Secondary | ICD-10-CM | POA: Diagnosis not present

## 2022-08-13 DIAGNOSIS — Z0100 Encounter for examination of eyes and vision without abnormal findings: Secondary | ICD-10-CM | POA: Diagnosis not present

## 2022-09-28 DIAGNOSIS — Z8546 Personal history of malignant neoplasm of prostate: Secondary | ICD-10-CM | POA: Diagnosis not present

## 2022-10-13 ENCOUNTER — Encounter (INDEPENDENT_AMBULATORY_CARE_PROVIDER_SITE_OTHER): Payer: Self-pay

## 2022-11-04 DIAGNOSIS — Z008 Encounter for other general examination: Secondary | ICD-10-CM | POA: Diagnosis not present

## 2022-11-04 DIAGNOSIS — E876 Hypokalemia: Secondary | ICD-10-CM | POA: Diagnosis not present

## 2022-11-04 DIAGNOSIS — R350 Frequency of micturition: Secondary | ICD-10-CM | POA: Diagnosis not present

## 2022-11-04 DIAGNOSIS — J300 Vasomotor rhinitis: Secondary | ICD-10-CM | POA: Diagnosis not present

## 2022-11-04 DIAGNOSIS — N5201 Erectile dysfunction due to arterial insufficiency: Secondary | ICD-10-CM | POA: Diagnosis not present

## 2022-11-04 DIAGNOSIS — Z87891 Personal history of nicotine dependence: Secondary | ICD-10-CM | POA: Diagnosis not present

## 2022-11-04 DIAGNOSIS — E785 Hyperlipidemia, unspecified: Secondary | ICD-10-CM | POA: Diagnosis not present

## 2022-11-04 DIAGNOSIS — Z8546 Personal history of malignant neoplasm of prostate: Secondary | ICD-10-CM | POA: Diagnosis not present

## 2022-11-04 DIAGNOSIS — R609 Edema, unspecified: Secondary | ICD-10-CM | POA: Diagnosis not present

## 2022-11-04 DIAGNOSIS — J4489 Other specified chronic obstructive pulmonary disease: Secondary | ICD-10-CM | POA: Diagnosis not present

## 2022-11-04 DIAGNOSIS — I10 Essential (primary) hypertension: Secondary | ICD-10-CM | POA: Diagnosis not present

## 2022-11-04 DIAGNOSIS — N529 Male erectile dysfunction, unspecified: Secondary | ICD-10-CM | POA: Diagnosis not present

## 2022-11-04 DIAGNOSIS — K219 Gastro-esophageal reflux disease without esophagitis: Secondary | ICD-10-CM | POA: Diagnosis not present

## 2022-11-04 DIAGNOSIS — M792 Neuralgia and neuritis, unspecified: Secondary | ICD-10-CM | POA: Diagnosis not present

## 2022-11-04 DIAGNOSIS — Z8249 Family history of ischemic heart disease and other diseases of the circulatory system: Secondary | ICD-10-CM | POA: Diagnosis not present

## 2022-11-04 DIAGNOSIS — I2510 Atherosclerotic heart disease of native coronary artery without angina pectoris: Secondary | ICD-10-CM | POA: Diagnosis not present

## 2022-11-16 ENCOUNTER — Other Ambulatory Visit: Payer: Self-pay | Admitting: Family Medicine

## 2022-11-17 ENCOUNTER — Ambulatory Visit (INDEPENDENT_AMBULATORY_CARE_PROVIDER_SITE_OTHER): Payer: Medicare HMO | Admitting: Family Medicine

## 2022-11-17 ENCOUNTER — Encounter: Payer: Self-pay | Admitting: Family Medicine

## 2022-11-17 VITALS — BP 112/70 | HR 54 | Temp 97.9°F | Ht 69.0 in | Wt 203.8 lb

## 2022-11-17 DIAGNOSIS — I10 Essential (primary) hypertension: Secondary | ICD-10-CM | POA: Diagnosis not present

## 2022-11-17 DIAGNOSIS — E785 Hyperlipidemia, unspecified: Secondary | ICD-10-CM | POA: Diagnosis not present

## 2022-11-17 DIAGNOSIS — Z8546 Personal history of malignant neoplasm of prostate: Secondary | ICD-10-CM | POA: Diagnosis not present

## 2022-11-17 DIAGNOSIS — Z0000 Encounter for general adult medical examination without abnormal findings: Secondary | ICD-10-CM

## 2022-11-17 DIAGNOSIS — Z23 Encounter for immunization: Secondary | ICD-10-CM

## 2022-11-17 LAB — LIPID PANEL
Cholesterol: 123 mg/dL (ref 0–200)
HDL: 45.4 mg/dL (ref >39.00)
LDL Cholesterol: 56 mg/dL (ref 0–99)
NonHDL: 77.59
Total CHOL/HDL Ratio: 3
Triglycerides: 106 mg/dL (ref 0.0–149.0)
VLDL: 21.2 mg/dL (ref 0.0–40.0)

## 2022-11-17 LAB — CBC WITH DIFFERENTIAL/PLATELET
Basophils Absolute: 0 10*3/uL (ref 0.0–0.1)
Basophils Relative: 1 % (ref 0.0–3.0)
Eosinophils Absolute: 0.1 10*3/uL (ref 0.0–0.7)
Eosinophils Relative: 2.9 % (ref 0.0–5.0)
HCT: 46.6 % (ref 39.0–52.0)
Hemoglobin: 15.5 g/dL (ref 13.0–17.0)
Lymphocytes Relative: 31 % (ref 12.0–46.0)
Lymphs Abs: 1.4 10*3/uL (ref 0.7–4.0)
MCHC: 33.3 g/dL (ref 30.0–36.0)
MCV: 95.8 fl (ref 78.0–100.0)
Monocytes Absolute: 0.6 10*3/uL (ref 0.1–1.0)
Monocytes Relative: 14.1 % — ABNORMAL HIGH (ref 3.0–12.0)
Neutro Abs: 2.3 10*3/uL (ref 1.4–7.7)
Neutrophils Relative %: 51 % (ref 43.0–77.0)
Platelets: 274 10*3/uL (ref 150.0–400.0)
RBC: 4.86 Mil/uL (ref 4.22–5.81)
RDW: 12.7 % (ref 11.5–15.5)
WBC: 4.5 10*3/uL (ref 4.0–10.5)

## 2022-11-17 LAB — COMPREHENSIVE METABOLIC PANEL
ALT: 21 U/L (ref 0–53)
AST: 18 U/L (ref 0–37)
Albumin: 4 g/dL (ref 3.5–5.2)
Alkaline Phosphatase: 81 U/L (ref 39–117)
BUN: 10 mg/dL (ref 6–23)
CO2: 33 mEq/L — ABNORMAL HIGH (ref 19–32)
Calcium: 9.5 mg/dL (ref 8.4–10.5)
Chloride: 102 mEq/L (ref 96–112)
Creatinine, Ser: 0.88 mg/dL (ref 0.40–1.50)
GFR: 86.71 mL/min (ref >60.00)
Glucose, Bld: 79 mg/dL (ref 70–99)
Potassium: 4.2 mEq/L (ref 3.5–5.1)
Sodium: 142 mEq/L (ref 135–145)
Total Bilirubin: 0.8 mg/dL (ref 0.2–1.2)
Total Protein: 6.8 g/dL (ref 6.0–8.3)

## 2022-11-17 MED ORDER — ALBUTEROL SULFATE HFA 108 (90 BASE) MCG/ACT IN AERS: INHALATION_SPRAY | RESPIRATORY_TRACT | 3 refills | Status: AC

## 2022-11-17 MED ORDER — GABAPENTIN 300 MG PO CAPS: 900.0000 mg | ORAL_CAPSULE | Freq: Every day | ORAL | 3 refills | Status: DC

## 2022-11-17 NOTE — Patient Instructions (Addendum)
Let us know if you get your [facility] or COVID vaccine at the pharmacy.  Please stop by lab before you go If you have mychart- we will send your results within 3 business days of [place] receiving them.  If you do not have mychart- we will call you about results within 5 business days of [place] receiving them.  *please also note that you will see labs on mychart as soon as they post. I will later go in and write notes on them- will say "notes from Dr. Durene Cal"  Recommended follow up: Return in about 6 months (around 05/17/2023) for followup or sooner if needed.Schedule b4 you leave.

## 2022-11-17 NOTE — Progress Notes (Signed)
Phone: [phone]   Subjective:  Patient presents today for their annual physical. Chief complaint-noted.   See problem oriented charting- ROS- full  review of systems was completed and negative  Per full ROS sheet completed by patient  The following were reviewed and entered/updated in epic: Past Medical History:  Diagnosis Date   Allergy    seasonal   Anxiety state, unspecified 09/01/2009   ASTHMA 03/12/2007   Asthma    Cancer (HCC) 2008   prostate cancer   COVID-19    Gastroparesis 05/01/2009   GERD 04/02/2009   Headache(784.0) 03/07/2007   HIATAL HERNIA 04/28/2009   Hiatal hernia    HYPERLIPIDEMIA 06/11/2007   HYPERTENSION 03/07/2007   Pneumonia    hx of   Polyp of colon, hyperplastic    PROSTATE CANCER, HX OF 03/07/2007   Suspected exposure to asbestos    have asbestosis testing every year   TRANSAMINASES, SERUM, ELEVATED 04/28/2009   Tubular adenoma of colon    Ulcer    as teenager   Patient Active Problem List   Diagnosis Date Noted   Asbestosis (HCC) 02/05/2016    Priority: High   Back pain 10/09/2013    Priority: High   Anxiety state 09/01/2009    Priority: High   Migraine headache 03/07/2007    Priority: High   Aortic atherosclerosis (HCC) 03/25/2021    Priority: Medium    Gastroparesis 05/01/2009    Priority: Medium    GERD 04/02/2009    Priority: Medium    Hyperlipidemia 06/11/2007    Priority: Medium    Asthma 03/12/2007    Priority: Medium    Essential hypertension 03/07/2007    Priority: Medium    PROSTATE CANCER, HX OF 03/07/2007    Priority: Medium    Allergic rhinitis 11/22/2013    Priority: Low   Overweight 01/10/2012    Priority: Low   HIATAL HERNIA 04/28/2009    Priority: Low   History of adenomatous polyp of colon 09/08/2017   Past Surgical History:  Procedure Laterality Date   APPENDECTOMY     BACK SURGERY  05/2011   COLONOSCOPY     LUMBAR LAMINECTOMY/DECOMPRESSION MICRODISCECTOMY N/A 10/09/2013   Procedure: L4-5  DECOMPRESSION/FARAMINOTOMY REVISION ;  Surgeon: Venita Lick, MD;  Location: [facility] OR;  Service: Orthopedics;  Laterality: N/A;   PROSTATECTOMY  07/10/06   ROTATOR CUFF REPAIR     9/09 on right   TRANSFORAMINAL LUMBAR INTERBODY FUSION (TLIF) WITH PEDICLE SCREW FIXATION 1 LEVEL N/A 10/09/2013   Procedure: TLIF L5-S1;  Surgeon: Venita Lick, MD;  Location: [facility] OR;  Service: Orthopedics;  Laterality: N/A;    Family History  Problem Relation Age of Onset   Pancreatic cancer Mother 28   Hypertension Father    Heart attack Father        32   Multiple sclerosis Sister    Heart disease Sister    Heart attack Sister    Hypertension Brother        x 2   Hypertension Brother    Kidney disease Brother    Hypertension Brother    Heart attack Maternal Grandmother    Heart attack Paternal Grandmother    Heart attack Paternal Grandfather    Liver cancer Daughter    Hypertension Son    Colon cancer Neg Hx    Rectal cancer Neg Hx    Stomach cancer Neg Hx    Esophageal cancer Neg Hx     Medications- reviewed and updated Current Outpatient Medications  Medication Sig Dispense Refill   albuterol (PROAIR HFA) 108 (90 Base) MCG/ACT inhaler INHALE 2 PUFFS EVERY 6 HOURS AS NEEDED FOR WHEEZING OR SHORTNESS OF BREATH 76.5 g 3   amLODipine (NORVASC) 2.5 MG tablet TAKE 1 TABLET BY MOUTH EVERY DAY 90 tablet 1   atorvastatin (LIPITOR) 20 MG tablet TAKE 1 TABLET (20 MG TOTAL) BY MOUTH DAILY. PT IS TAKING AT BEDTIME 90 tablet 3   Azelastine HCl 137 MCG/SPRAY SOLN PLACE 2 SPRAYS INTO EACH NOSTRIL DAILY. 30 mL 3   famotidine (PEPCID) 20 MG tablet Take 1 tablet (20 mg total) by mouth at bedtime. 90 tablet 3   fexofenadine (ALLEGRA) 180 MG tablet Patient is taking 2 times daily 90 tablet 1   fluticasone (FLONASE) 50 MCG/ACT nasal spray USE ONE SPRAY IN EACH NOSTRIL AS DIRECTED ONCE DAILY 48 mL 3   gabapentin (NEURONTIN) 300 MG capsule Take 3 capsules (900 mg total) by mouth at bedtime. (Patient taking differently:  Take 900 mg by mouth as needed.) 30 capsule 1   hydrochlorothiazide (HYDRODIURIL) 25 MG tablet TAKE 1 TABLET BY MOUTH TWICE A DAY 180 tablet 3   montelukast (SINGULAIR) 10 MG tablet TAKE 1 TABLET BY MOUTH EVERYDAY AT BEDTIME 90 tablet 3   omeprazole (PRILOSEC) 40 MG capsule ONE CAPSULE DAILY 30 MINUTES BEFORE BREAKFAST - 90 capsule 3   PARoxetine (PAXIL) 20 MG tablet TAKE 1 TABLET BY MOUTH EVERYDAY AT BEDTIME 90 tablet 1   potassium chloride SA (KLOR-CON M) 20 MEQ tablet Take 1 tablet (20 mEq total) by mouth once a week. 13 tablet 3   propranolol (INDERAL) 40 MG tablet TAKE 1 TABLET BY MOUTH TWICE A DAY 180 tablet 3   SYMBICORT 160-4.5 MCG/ACT inhaler TAKE 2 PUFFS BY MOUTH TWICE A DAY 10.2 each 11   VESICARE 5 MG tablet Take 1 tablet by mouth daily.     vitamin B-12 (CYANOCOBALAMIN) 500 MCG tablet Take 500 mcg by mouth daily.     butalbital-acetaminophen-caffeine (FIORICET) 50-325-40 MG tablet TAKE 1 TABLET BY MOUTH DAILY AS NEEDED- MAX 2 PER WEEK. 1 REFILL PER MONTH. (Patient not taking: Reported on 11/17/2022) 10 tablet 5   cyclobenzaprine (FLEXERIL) 10 MG tablet TAKE 0.5-1 TABLETS BY MOUTH 3 (THREE) TIMES DAILY AS NEEDED FOR MUSCLE SPASMS. (Patient not taking: Reported on 11/17/2022) 30 tablet 5   ondansetron (ZOFRAN) 4 MG tablet TAKE 1 TABLET BY MOUTH EVERY 8 HOURS AS NEEDED FOR NAUSEA AND VOMITING (Patient not taking: Reported on 11/17/2022) 30 tablet 3   promethazine (PHENERGAN) 12.5 MG tablet Take 1 tablet (12.5 mg total) by mouth daily. As needed (Patient not taking: Reported on 11/17/2022) 30 tablet 3   traMADol (ULTRAM) 50 MG tablet TAKE 1 TABLET BY MOUTH DAILY AS NEEDED. (Patient not taking: Reported on 11/17/2022) 30 tablet 5   No current facility-administered medications for this visit.    Allergies-reviewed and updated Allergies  Allergen Reactions   Propoxyphene Hcl Nausea And Vomiting         Social History   Social History Narrative   Married 1974. Wife-Helen. 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    Lost daughter Inetta Fermo at age 17 in 2023- left behind 1 child that now lives with him      Retired from [employer]      Hobbies:  hunting, boat (off for 1 year in 2015)   Objective  Objective:  BP 112/70   Pulse (!) 54   Temp 97.9 F (36.6 C)   Ht 5\' 9"  (1.753 m)   Wt 203 lb 12.8 oz (92.4 kg)   SpO2 95%   BMI 30.10 kg/m  Gen: NAD, resting comfortably HEENT: Mucous membranes are moist. Oropharynx normal Neck: no thyromegaly CV: RRR no murmurs rubs or gallops Lungs: CTAB no crackles, wheeze, rhonchi Abdomen: soft/nontender/nondistended/normal bowel sounds. No rebound or guarding.  Ext: no edema Skin: warm, dry Neuro: grossly normal, moves all extremities, PERRLA   Assessment and Plan  72 y.o. male presenting for annual physical.  Health Maintenance counseling: 1. Anticipatory guidance: Patient counseled regarding regular dental exams -q6 months, eye exams -yearly,  avoiding smoking and second hand smoke , limiting alcohol to 2 beverages per day - occasionally will have wine when goes out, no illicit drugs .   2. Risk factor reduction:  Advised patient of need for regular exercise and diet rich and fruits and vegetables to reduce risk of heart attack and stroke.  Exercise- working on treadmill and doing some swimming- gets some In everyday.  Diet/weight management-up 3 pounds from last year-encouraged mild weight loss- feels could cut back some on sweets.  Wt Readings from Last 3 Encounters:  11/17/22 203 lb 12.8 oz (92.4 kg)  07/18/22 204 lb 2 oz (92.6 kg)  05/13/22 200 lb 3.2 oz (90.8 kg)  3. Immunizations/screenings/ancillary studies-flu shot today, considering COVID vaccination at pharmacy, considering Shingrix at pharmacy  Immunization History  Administered Date(s) Administered   Fluad Quad(high  Dose 65+) 10/22/2018, 01/14/2020, 11/12/2021   Fluad Trivalent(High Dose 65+) 11/17/2022   H1N1 03/21/2008   Influenza Split 12/31/2010, 11/22/2011, 11/28/2012   Influenza Whole 12/12/2007, 12/11/2008   Influenza, High Dose Seasonal PF 12/07/2016, 12/20/2017, 12/03/2020   Influenza,inj,Quad PF,6+ Mos 12/12/2014, 12/31/2015   PFIZER(Purple Top)SARS-COV-2 Vaccination 03/23/2019, 04/14/2019, 11/28/2019   Pfizer Covid-19 Vaccine Bivalent Booster 80yrs & up 12/03/2020   Pneumococcal Conjugate-13 12/07/2016   Pneumococcal Polysaccharide-23 12/20/2017   Td 03/01/1995, 06/17/2008   Tdap 06/11/2007, 08/27/2018   Zoster, Live 01/10/2012  4. Prostate cancer screening- history of prostate cancer prior robotic prostatectomy-still follows with Dr. Laverle Patter- just seen recently and PSA undetectable. Also checked urine . Still on vesicare Lab Results  Component Value Date   PSA 0.015 09/14/2020   PSA 0.015 09/12/2019   PSA 0.015 09/05/2017   5. . Colon cancer screening - 12/17/20 with 3 year repeat planned. No blood in stool 6. Skin cancer screening- lower risk due to melanin content.  advised regular sunscreen use. Denies worrisome, changing, or new skin lesions.  7. Smoking associated screening (lung cancer screening, AAA screen 65-75, UA)- former smoker- quit 1985 and under 2 pack years-get UA with urology . No aneurysm on 03/24/21 ct abdomen  8. STD screening -  only active with wife- trimex works well   Status of chronic or acute concerns   #social update- lost daughter in 2023 to liver cancer- caring for 65 year old grandson now.   #Asbestosis class II-follows with Dr. Esau Grew - talked to him 2 days ago #Asthma S: Medication: Symbicort for maintenance added August 2021 in addition to Singulair 10 mg, albuterol as needed  - no shortness of breath. Some wheeze at times A/P: both issues stable- continue current medications    #hypertension S: medication: Propanolol 40mg  twice daily,  hydrochlorothiazide 25 mg amlodipine 2.5Mg  A/P: stable- continue current  medicines   #hyperlipidemia-LDL has been below 70 S: Medication:Atorvastatin 20Mg  Lab Results  Component Value Date   CHOL 123 11/12/2021   HDL 44 11/12/2021   LDLCALC 60 11/12/2021   LDLDIRECT 75.0 08/07/2015   TRIG 107 11/12/2021   CHOLHDL 2.8 11/12/2021   A/P: hopefully stable- update lipid panel today. Continue current meds for now   # Anxiety S:Medication: Paxil 20 mg A/P: reasonable control- continue current medications. No SI    #Chronic back pain-in the past patient was on Percocet but later weaned to Mobic and Valium and gabapentin and tramadol -Currently on Flexeril 10 mg up to 3 times a day as needed, gabapentin 300 mg at bedtime, tramadol daily as needed- needs most days  #Migraines S: Prophylaxis: Propranolol for blood pressure control but also likely helps reduce frequency of migraines. -Fioricet uses typically twice a week-lusually twice a week or less but occasional more  A/P: overall stable- continue current medications    # GERD S:Medication: Prilosec 40Mg  daily and famotidine at bedtime B12 levels related to PPI use: Low normal-he takes a B12 supplement still -Endoscopy 04/08/2020 showing esophageal candidiasis A/P: stable- continue current medicines    #Allergies-Astelin helpful along with Singulair 10 mg  #low potassium- takes potassium once a week- occasional will take twice a week if legs ache  Recommended follow up: Return in about 6 months (around 05/17/2023) for followup or sooner if needed.Schedule b4 you leave. Future Appointments  Date Time Provider Department Center  05/16/2023  8:00 AM [facility] ANNUAL WELLNESS VISIT 1 [facility]   Lab/Order associations: fasting   ICD-10-CM   1. Need for influenza vaccination  Z23 Flu Vaccine Trivalent High Dose (Fluad)    2. Preventative health care  Z00.00     3. Essential hypertension  I10     4. Hyperlipidemia, unspecified  hyperlipidemia type  E78.5     5. History of prostate cancer  Z85.46       No orders of the defined types were placed in this encounter.   Return precautions advised.  Tana Conch, MD

## 2022-12-02 ENCOUNTER — Other Ambulatory Visit: Payer: Self-pay | Admitting: Family Medicine

## 2023-02-10 ENCOUNTER — Other Ambulatory Visit: Payer: Self-pay | Admitting: Family Medicine

## 2023-02-13 ENCOUNTER — Other Ambulatory Visit: Payer: Self-pay | Admitting: Family Medicine

## 2023-02-21 ENCOUNTER — Other Ambulatory Visit: Payer: Self-pay | Admitting: Family Medicine

## 2023-02-21 MED ORDER — CYCLOBENZAPRINE HCL 10 MG PO TABS: 10.0000 mg | ORAL_TABLET | Freq: Three times a day (TID) | ORAL | 5 refills | Status: AC | PRN

## 2023-02-21 NOTE — Telephone Encounter (Signed)
Copied from CRM [id number]. Topic: Clinical - Medication Refill >> Feb 21, 2023 11:08 AM Efraim Kaufmann C wrote: Most Recent Primary Care Visit:  Provider: Shelva Majestic  Department: [facility]  Visit Type: PHYSICAL  Date: 11/17/2022  Medication: cyclobenzaprine (FLEXERIL) 10 MG tablet   Has the patient contacted their pharmacy? Yes (Agent: If no, request that the patient contact the pharmacy for the refill. If patient does not wish to contact the pharmacy document the reason why and proceed with request.) (Agent: If yes, when and what did the pharmacy advise?)  Is this the correct pharmacy for this prescription? Yes If no, delete pharmacy and type the correct one.  This is the patient's preferred pharmacy:  [id number] - [place], [place] - [address] AT [facility] [address] [place] [address] Phone: [phone] Fax: [phone]   Has the prescription been filled recently? No  Is the patient out of the medication? Yes  Has the patient been seen for an appointment in the last year OR does the patient have an upcoming appointment? Yes  Can we respond through MyChart? Yes  Agent: Please be advised that Rx refills may take up to 3 business days. We ask that you follow-up with your pharmacy.

## 2023-05-11 ENCOUNTER — Other Ambulatory Visit: Payer: Self-pay | Admitting: Family Medicine

## 2023-05-16 ENCOUNTER — Ambulatory Visit (INDEPENDENT_AMBULATORY_CARE_PROVIDER_SITE_OTHER): Payer: Medicare HMO

## 2023-05-16 VITALS — Ht 71.0 in | Wt 203.0 lb

## 2023-05-16 DIAGNOSIS — Z0000 Encounter for general adult medical examination without abnormal findings: Secondary | ICD-10-CM | POA: Diagnosis not present

## 2023-05-16 NOTE — Progress Notes (Signed)
 Subjective:   Mark [name] is a 73 y.o. who presents for a Medicare Wellness preventive visit.  Visit Complete: Virtual I connected with  Mark [name] on 05/16/23 by a audio enabled telemedicine application and verified that I am speaking with the correct person using two identifiers.  Patient Location: Home  Provider Location: Office/Clinic  I discussed the limitations of evaluation and management by telemedicine. The patient expressed understanding and agreed to proceed.  Vital Signs: Because this visit was a virtual/telehealth visit, some criteria may be missing or patient reported. Any vitals not documented were not able to be obtained and vitals that have been documented are patient reported.  VideoDeclined- This patient declined Librarian, academic. Therefore the visit was completed with audio only.  Persons Participating in Visit: Patient.  AWV Questionnaire: No: Patient Medicare AWV questionnaire was not completed prior to this visit.  Cardiac Risk Factors include: advanced age (>74men, >50 women);male gender;hypertension;dyslipidemia     Objective:    Today's Vitals   05/16/23 0806  Weight: 203 lb (92.1 kg)  Height: 5\' 11"  (1.803 m)   Body mass index is 28.31 kg/m.     05/16/2023    8:09 AM 04/28/2022    8:30 AM 12/14/2021    8:16 PM 04/16/2021    8:50 AM 04/10/2020    9:03 AM 03/11/2019   10:52 AM 09/19/2017   10:36 AM  Advanced Directives  Does Patient Have a Medical Advance Directive? Yes Yes Yes Yes Yes Yes Yes  Type of Estate agent of [place];Living will Healthcare Power of [place];Living will Healthcare Power of [place];Living will Healthcare Power of [employer] Power of Attorney Living will;Healthcare Power of [employer] Power of [place];Living will  Does patient want to make changes to medical advance directive? No - Patient declined No - Patient declined    No - Patient  declined No - Patient declined  Copy of Healthcare Power of Attorney in Chart? Yes - validated most recent copy scanned in chart (See row information) Yes - validated most recent copy scanned in chart (See row information) No - copy requested Yes - validated most recent copy scanned in chart (See row information) Yes - validated most recent copy scanned in chart (See row information) No - copy requested No - copy requested  Would patient like information on creating a medical advance directive?  No - Patient declined         Current Medications (verified) Outpatient Encounter Medications as of 05/16/2023  Medication Sig   albuterol (PROAIR HFA) 108 (90 Base) MCG/ACT inhaler INHALE 2 PUFFS EVERY 6 HOURS AS NEEDED FOR WHEEZING OR SHORTNESS OF BREATH   amLODipine (NORVASC) 2.5 MG tablet TAKE 1 TABLET BY MOUTH EVERY DAY   atorvastatin (LIPITOR) 20 MG tablet TAKE 1 TABLET (20 MG TOTAL) BY MOUTH DAILY. PT IS TAKING AT BEDTIME   Azelastine HCl 137 MCG/SPRAY SOLN PLACE 2 SPRAYS INTO EACH NOSTRIL DAILY.   butalbital-acetaminophen-caffeine (FIORICET) 50-325-40 MG tablet TAKE 1 TABLET BY MOUTH DAILY AS NEEDED- MAX 2 PER WEEK. 1 REFILL PER MONTH   cyclobenzaprine (FLEXERIL) 10 MG tablet Take 1 tablet (10 mg total) by mouth 3 (three) times daily as needed for muscle spasms.   famotidine (PEPCID) 20 MG tablet Take 1 tablet (20 mg total) by mouth at bedtime.   fexofenadine (ALLEGRA) 180 MG tablet Patient is taking 2 times daily   fluticasone (FLONASE) 50 MCG/ACT nasal spray USE ONE SPRAY IN EACH NOSTRIL AS  DIRECTED ONCE DAILY   gabapentin (NEURONTIN) 300 MG capsule Take 3 capsules (900 mg total) by mouth at bedtime.   hydrochlorothiazide (HYDRODIURIL) 25 MG tablet TAKE 1 TABLET BY MOUTH TWICE A DAY   KLOR-CON M20 20 MEQ tablet TAKE 1 TABLET (20 MEQ TOTAL) BY MOUTH ONCE A WEEK.   montelukast (SINGULAIR) 10 MG tablet TAKE 1 TABLET BY MOUTH EVERYDAY AT BEDTIME   omeprazole (PRILOSEC) 40 MG capsule ONE CAPSULE  DAILY 30 MINUTES BEFORE BREAKFAST -   ondansetron (ZOFRAN) 4 MG tablet TAKE 1 TABLET BY MOUTH EVERY 8 HOURS AS NEEDED FOR NAUSEA AND VOMITING   PARoxetine (PAXIL) 20 MG tablet TAKE 1 TABLET BY MOUTH EVERYDAY AT BEDTIME   promethazine (PHENERGAN) 12.5 MG tablet Take 1 tablet (12.5 mg total) by mouth daily. As needed   propranolol (INDERAL) 40 MG tablet TAKE 1 TABLET BY MOUTH TWICE A DAY   SYMBICORT 160-4.5 MCG/ACT inhaler TAKE 2 PUFFS BY MOUTH TWICE A DAY   traMADol (ULTRAM) 50 MG tablet TAKE 1 TABLET BY MOUTH DAILY AS NEEDED.   VESICARE 5 MG tablet Take 1 tablet by mouth daily.   vitamin B-12 (CYANOCOBALAMIN) 500 MCG tablet Take 500 mcg by mouth daily.   No facility-administered encounter medications on file as of 05/16/2023.    Allergies (verified) Propoxyphene hcl   History: Past Medical History:  Diagnosis Date   Allergy    seasonal   Anxiety state, unspecified 09/01/2009   ASTHMA 03/12/2007   Asthma    Cancer (HCC) 2008   prostate cancer   COVID-19    Gastroparesis 05/01/2009   GERD 04/02/2009   Headache(784.0) 03/07/2007   HIATAL HERNIA 04/28/2009   Hiatal hernia    HYPERLIPIDEMIA 06/11/2007   HYPERTENSION 03/07/2007   Pneumonia    hx of   Polyp of colon, hyperplastic    PROSTATE CANCER, HX OF 03/07/2007   Suspected exposure to asbestos    have asbestosis testing every year   TRANSAMINASES, SERUM, ELEVATED 04/28/2009   Tubular adenoma of colon    Ulcer    as teenager   Past Surgical History:  Procedure Laterality Date   APPENDECTOMY     BACK SURGERY  05/2011   COLONOSCOPY     LUMBAR LAMINECTOMY/DECOMPRESSION MICRODISCECTOMY N/A 10/09/2013   Procedure: L4-5 DECOMPRESSION/FARAMINOTOMY REVISION ;  Surgeon: Venita Lick, MD;  Location: [facility] OR;  Service: Orthopedics;  Laterality: N/A;   PROSTATECTOMY  07/10/06   ROTATOR CUFF REPAIR     9/09 on right   TRANSFORAMINAL LUMBAR INTERBODY FUSION (TLIF) WITH PEDICLE SCREW FIXATION 1 LEVEL N/A 10/09/2013   Procedure: TLIF  L5-S1;  Surgeon: Venita Lick, MD;  Location: [facility] OR;  Service: Orthopedics;  Laterality: N/A;   Family History  Problem Relation Age of Onset   Pancreatic cancer Mother 90   Hypertension Father    Heart attack Father        42   Multiple sclerosis Sister    Heart disease Sister    Heart attack Sister    Hypertension Brother        x 2   Hypertension Brother    Kidney disease Brother    Hypertension Brother    Heart attack Maternal Grandmother    Heart attack Paternal Grandmother    Heart attack Paternal Grandfather    Liver cancer Daughter    Hypertension Son    Colon cancer Neg Hx    Rectal cancer Neg Hx    Stomach cancer Neg Hx    Esophageal  cancer Neg Hx    Social History   Socioeconomic History   Marital status: Married    Spouse name: Not on file   Number of children: 4   Years of education: Not on file   Highest education level: Not on file  Occupational History   Occupation: Disabled  Tobacco Use   Smoking status: Former    Current packs/day: 0.00    Average packs/day: 0.5 packs/day for 2.0 years (1.0 ttl pk-yrs)    Types: Cigarettes    Start date: 04/08/1981    Quit date: 04/09/1983    Years since quitting: 40.1   Smokeless tobacco: Never  Vaping Use   Vaping status: Never Used  Substance and Sexual Activity   Alcohol use: No    Alcohol/week: 0.0 standard drinks of alcohol   Drug use: No   Sexual activity: Yes  Other Topics Concern   Not on file  Social History Narrative   Married 1974. Wife-Helen. 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    Lost daughter Inetta Fermo at age 82 in 2023- left behind 1 child that now lives with him      Retired from [employer]      Hobbies: hunting, boat (off for 1 year in 2015)   Social Drivers of Health   Financial Resource Strain: Low Risk  (05/16/2023)   Overall  Financial Resource Strain (CARDIA)    Difficulty of Paying Living Expenses: Not hard at all  Food Insecurity: No Food Insecurity (05/16/2023)   Hunger Vital Sign    Worried About Running Out of Food in the Last Year: Never true    Ran Out of Food in the Last Year: Never true  Transportation Needs: No Transportation Needs (05/16/2023)   PRAPARE - Administrator, Civil Service (Medical): No    Lack of Transportation (Non-Medical): No  Physical Activity: Insufficiently Active (05/16/2023)   Exercise Vital Sign    Days of Exercise per Week: 3 days    Minutes of Exercise per Session: 30 min  Stress: No Stress Concern Present (05/16/2023)   [employer] of Occupational Health - Occupational Stress Questionnaire    Feeling of Stress : Not at all  Social Connections: Socially Integrated (05/16/2023)   Social Connection and Isolation Panel [NHANES]    Frequency of Communication with Friends and Family: More than three times a week    Frequency of Social Gatherings with Friends and Family: More than three times a week    Attends Religious Services: More than 4 times per year    Active Member of [employer] or Organizations: Yes    Attends Banker Meetings: 1 to 4 times per year    Marital Status: Married    Tobacco Counseling Counseling given: Not Answered    Clinical Intake:  Pre-visit preparation completed: Yes  Pain : No/denies pain     BMI - recorded: 28.31 Nutritional Status: BMI 25 -29 Overweight Nutritional Risks: None Diabetes: No  How often do you need to have someone help you when you read instructions, pamphlets, or other written materials from your doctor or pharmacy?: 1 - Never  Interpreter Needed?: No  Information entered by :: Lanier Ensign, LPN   Activities of Daily Living     05/16/2023    8:08 AM  In your present  state of health, do you have any difficulty performing the following activities:  Hearing? 0  Vision? 0  Difficulty  concentrating or making decisions? 0  Walking or climbing stairs? 0  Dressing or bathing? 0  Doing errands, shopping? 0  Preparing Food and eating ? N  Using the Toilet? N  In the past six months, have you accidently leaked urine? N  Do you have problems with loss of bowel control? N  Managing your Medications? N  Managing your Finances? N  Housekeeping or managing your Housekeeping? N    Patient Care Team: Shelva Majestic, MD as PCP - General (Family Medicine) Napoleon Form, MD as Consulting Physician (Gastroenterology) Heloise Purpura, MD as Consulting Physician (Urology)  Indicate any recent Medical Services you may have received from other than [facility] providers in the past year (date may be approximate).     Assessment:   This is a routine wellness examination for [place].  Hearing/Vision screen Hearing Screening - Comments:: Pt denies any hearing issues  Vision Screening - Comments:: Pt follows up with [facility] for annual eye exams    Goals Addressed             This Visit's Progress    Patient Stated       Lose weight        Depression Screen     05/16/2023    8:13 AM 11/17/2022    9:13 AM 05/13/2022    1:29 PM 04/28/2022    8:29 AM 04/16/2021    8:50 AM 10/21/2020    8:32 AM 04/10/2020    9:01 AM  PHQ 2/9 Scores  PHQ - 2 Score 0 0 0 0 0 0 0  PHQ- 9 Score  0 0        Fall Risk     05/16/2023    8:14 AM 11/17/2022    9:13 AM 05/13/2022    1:29 PM 04/28/2022    8:31 AM 04/16/2021    8:51 AM  Fall Risk   Falls in the past year? 0 0 0 0 0  Number falls in past yr: 0 0 0 0 0  Injury with Fall? 0  0 0 0  Risk for fall due to : No Fall Risks No Fall Risks No Fall Risks Impaired vision Impaired vision  Follow up Falls prevention discussed Falls evaluation completed Falls evaluation completed Falls prevention discussed Falls prevention discussed    MEDICARE RISK AT HOME:  Medicare Risk at Home Any stairs in or around the home?: Yes If so, are there  any without handrails?: No Home free of loose throw rugs in walkways, pet beds, electrical cords, etc?: Yes Adequate lighting in your home to reduce risk of falls?: Yes Life alert?: No Use of a cane, walker or w/c?: Yes (at times) Grab bars in the bathroom?: No Shower chair or bench in shower?: Yes Elevated toilet seat or a handicapped toilet?: Yes  TIMED UP AND GO:  Was the test performed?  No  Cognitive Function: 6CIT completed        05/16/2023    8:15 AM 04/28/2022    8:31 AM 04/16/2021    8:55 AM 04/10/2020    9:06 AM 09/19/2017   11:02 AM  6CIT Screen  What Year? 0 points 0 points 0 points 0 points 0 points  What month? 0 points 0 points 0 points 0 points 0 points  What time? 0 points 0 points 0 points  0 points  Count back from 20 0 points 0 points 0 points 0 points 0 points  Months in reverse 0 points 0 points 0 points 0 points 0 points  Repeat phrase 0 points 0 points 2 points 4 points   Total Score 0 points 0 points 2 points      Immunizations Immunization History  Administered Date(s) Administered   Fluad Quad(high Dose 65+) 10/22/2018, 01/14/2020, 11/12/2021   Fluad Trivalent(High Dose 65+) 11/17/2022   H1N1 03/21/2008   Influenza Split 12/31/2010, 11/22/2011, 11/28/2012   Influenza Whole 12/12/2007, 12/11/2008   Influenza, High Dose Seasonal PF 12/07/2016, 12/20/2017, 12/03/2020   Influenza,inj,Quad PF,6+ Mos 12/12/2014, 12/31/2015   PFIZER(Purple Top)SARS-COV-2 Vaccination 03/23/2019, 04/14/2019, 11/28/2019   Pfizer Covid-19 Vaccine Bivalent Booster 23yrs & up 12/03/2020   Pneumococcal Conjugate-13 12/07/2016   Pneumococcal Polysaccharide-23 12/20/2017   Td 03/01/1995, 06/17/2008   Tdap 06/11/2007, 08/27/2018   Zoster, Live 01/10/2012    Screening Tests Health Maintenance  Topic Date Due   Zoster Vaccines- Shingrix (1 of 2) 09/13/1970   COVID-19 Vaccine (5 - 2024-25 season) 10/30/2022   Colonoscopy  12/18/2023   Medicare Annual Wellness (AWV)   05/15/2024   DTaP/Tdap/Td (5 - Td or Tdap) 08/26/2028   Pneumonia Vaccine 62+ Years old  Completed   INFLUENZA VACCINE  Completed   Hepatitis C Screening  Completed   HPV VACCINES  Aged Out    Health Maintenance  Health Maintenance Due  Topic Date Due   Zoster Vaccines- Shingrix (1 of 2) 09/13/1970   COVID-19 Vaccine (5 - 2024-25 season) 10/30/2022   Health Maintenance Items Addressed: See Nurse Notes  Additional Screening:  Vision Screening: Recommended annual ophthalmology exams for early detection of glaucoma and other disorders of the eye.  Dental Screening: Recommended annual dental exams for proper oral hygiene  Community Resource Referral / Chronic Care Management: CRR required this visit?  No   CCM required this visit?  No     Plan:     I have personally reviewed and noted the following in the patient's chart:   Medical and social history Use of alcohol, tobacco or illicit drugs  Current medications and supplements including opioid prescriptions. Patient is currently taking opioid prescriptions. Information provided to patient regarding non-opioid alternatives. Patient advised to discuss non-opioid treatment plan with their provider. Functional ability and status Nutritional status Physical activity Advanced directives List of other physicians Hospitalizations, surgeries, and ER visits in previous 12 months Vitals Screenings to include cognitive, depression, and falls Referrals and appointments  In addition, I have reviewed and discussed with patient certain preventive protocols, quality metrics, and best practice recommendations. A written personalized care plan for preventive services as well as general preventive health recommendations were provided to patient.     Marzella Schlein, LPN   06/06/8117   After Visit Summary: (MyChart) Due to this being a telephonic visit, the after visit summary with patients personalized plan was offered to patient via  MyChart   Notes: Nothing significant to report at this time.

## 2023-05-16 NOTE — Patient Instructions (Signed)
 [name] , Thank you for taking time to come for your Medicare Wellness Visit. I appreciate your ongoing commitment to your health goals. Please review the following plan we discussed and let me know if I can assist you in the future.   Referrals/Orders/Follow-Ups/Clinician Recommendations: Aim for 30 minutes of exercise or brisk walking, 6-8 glasses of water, and 5 servings of fruits and vegetables each day.   This is a list of the screening recommended for you and due dates:  Health Maintenance  Topic Date Due   Zoster (Shingles) Vaccine (1 of 2) 09/13/1970   COVID-19 Vaccine (5 - 2024-25 season) 10/30/2022   Colon Cancer Screening  12/18/2023   Medicare Annual Wellness Visit  05/15/2024   DTaP/Tdap/Td vaccine (5 - Td or Tdap) 08/26/2028   Pneumonia Vaccine  Completed   Flu Shot  Completed   Hepatitis C Screening  Completed   HPV Vaccine  Aged Out    Advanced directives: (In Chart) A copy of your advanced directives are scanned into your chart should your provider ever need it.  Next Medicare Annual Wellness Visit scheduled for next year: Yes

## 2023-05-17 ENCOUNTER — Encounter: Payer: Self-pay | Admitting: Family Medicine

## 2023-05-17 ENCOUNTER — Ambulatory Visit (INDEPENDENT_AMBULATORY_CARE_PROVIDER_SITE_OTHER): Payer: Medicare HMO | Admitting: Family Medicine

## 2023-05-17 VITALS — BP 130/82 | HR 62 | Temp 97.5°F | Ht 71.0 in | Wt 208.0 lb

## 2023-05-17 DIAGNOSIS — G43109 Migraine with aura, not intractable, without status migrainosus: Secondary | ICD-10-CM | POA: Diagnosis not present

## 2023-05-17 DIAGNOSIS — I10 Essential (primary) hypertension: Secondary | ICD-10-CM

## 2023-05-17 DIAGNOSIS — E785 Hyperlipidemia, unspecified: Secondary | ICD-10-CM | POA: Diagnosis not present

## 2023-05-17 DIAGNOSIS — I700 Atherosclerosis of aorta: Secondary | ICD-10-CM

## 2023-05-17 DIAGNOSIS — J61 Pneumoconiosis due to asbestos and other mineral fibers: Secondary | ICD-10-CM

## 2023-05-17 DIAGNOSIS — J4520 Mild intermittent asthma, uncomplicated: Secondary | ICD-10-CM | POA: Diagnosis not present

## 2023-05-17 DIAGNOSIS — F411 Generalized anxiety disorder: Secondary | ICD-10-CM

## 2023-05-17 LAB — COMPREHENSIVE METABOLIC PANEL
ALT: 27 U/L (ref 0–53)
AST: 27 U/L (ref 0–37)
Albumin: 4 g/dL (ref 3.5–5.2)
Alkaline Phosphatase: 87 U/L (ref 39–117)
BUN: 11 mg/dL (ref 6–23)
CO2: 33 meq/L — ABNORMAL HIGH (ref 19–32)
Calcium: 9.3 mg/dL (ref 8.4–10.5)
Chloride: 100 meq/L (ref 96–112)
Creatinine, Ser: 0.8 mg/dL (ref 0.40–1.50)
GFR: 88.94 mL/min (ref >60.00)
Glucose, Bld: 85 mg/dL (ref 70–99)
Potassium: 3.6 meq/L (ref 3.5–5.1)
Sodium: 138 meq/L (ref 135–145)
Total Bilirubin: 0.8 mg/dL (ref 0.2–1.2)
Total Protein: 6.6 g/dL (ref 6.0–8.3)

## 2023-05-17 MED ORDER — TRAMADOL HCL 50 MG PO TABS: 50.0000 mg | ORAL_TABLET | Freq: Every day | ORAL | 5 refills | Status: DC | PRN

## 2023-05-17 NOTE — Progress Notes (Signed)
 Phone [phone] In person visit   Subjective:   Mark [name] is a 73 y.o. year old very pleasant male patient who presents for/with See problem oriented charting Chief Complaint  Patient presents with   Medical Management of Chronic Issues   Hypertension   Hyperlipidemia    Past Medical History-  Patient Active Problem List   Diagnosis Date Noted   Asbestosis (HCC) 02/05/2016    Priority: High   Back pain 10/09/2013    Priority: High   Anxiety state 09/01/2009    Priority: High   Migraine headache 03/07/2007    Priority: High   Aortic atherosclerosis (HCC) 03/25/2021    Priority: Medium    Gastroparesis 05/01/2009    Priority: Medium    GERD 04/02/2009    Priority: Medium    Hyperlipidemia 06/11/2007    Priority: Medium    Asthma 03/12/2007    Priority: Medium    Essential hypertension 03/07/2007    Priority: Medium    PROSTATE CANCER, HX OF 03/07/2007    Priority: Medium    Allergic rhinitis 11/22/2013    Priority: Low   Overweight 01/10/2012    Priority: Low   HIATAL HERNIA 04/28/2009    Priority: Low   History of adenomatous polyp of colon 09/08/2017    Medications- reviewed and updated Current Outpatient Medications  Medication Sig Dispense Refill   albuterol (PROAIR HFA) 108 (90 Base) MCG/ACT inhaler INHALE 2 PUFFS EVERY 6 HOURS AS NEEDED FOR WHEEZING OR SHORTNESS OF BREATH 76.5 g 3   amLODipine (NORVASC) 2.5 MG tablet TAKE 1 TABLET BY MOUTH EVERY DAY 90 tablet 1   atorvastatin (LIPITOR) 20 MG tablet TAKE 1 TABLET (20 MG TOTAL) BY MOUTH DAILY. PT IS TAKING AT BEDTIME 90 tablet 3   Azelastine HCl 137 MCG/SPRAY SOLN PLACE 2 SPRAYS INTO EACH NOSTRIL DAILY. 90 mL 3   butalbital-acetaminophen-caffeine (FIORICET) 50-325-40 MG tablet TAKE 1 TABLET BY MOUTH DAILY AS NEEDED- MAX 2 PER WEEK. 1 REFILL PER MONTH 10 tablet 5   famotidine (PEPCID) 20 MG tablet Take 1 tablet (20 mg total) by mouth at bedtime. 90 tablet 3   fexofenadine (ALLEGRA) 180 MG tablet  Patient is taking 2 times daily 90 tablet 1   fluticasone (FLONASE) 50 MCG/ACT nasal spray USE ONE SPRAY IN EACH NOSTRIL AS DIRECTED ONCE DAILY 48 mL 3   gabapentin (NEURONTIN) 300 MG capsule Take 3 capsules (900 mg total) by mouth at bedtime. 270 capsule 3   hydrochlorothiazide (HYDRODIURIL) 25 MG tablet TAKE 1 TABLET BY MOUTH TWICE A DAY 180 tablet 3   KLOR-CON M20 20 MEQ tablet TAKE 1 TABLET (20 MEQ TOTAL) BY MOUTH ONCE A WEEK. 13 tablet 3   montelukast (SINGULAIR) 10 MG tablet TAKE 1 TABLET BY MOUTH EVERYDAY AT BEDTIME 90 tablet 3   omeprazole (PRILOSEC) 40 MG capsule ONE CAPSULE DAILY 30 MINUTES BEFORE BREAKFAST - 90 capsule 3   PARoxetine (PAXIL) 20 MG tablet TAKE 1 TABLET BY MOUTH EVERYDAY AT BEDTIME 90 tablet 1   propranolol (INDERAL) 40 MG tablet TAKE 1 TABLET BY MOUTH TWICE A DAY 180 tablet 3   SYMBICORT 160-4.5 MCG/ACT inhaler TAKE 2 PUFFS BY MOUTH TWICE A DAY 10.2 each 11   VESICARE 5 MG tablet Take 1 tablet by mouth daily.     vitamin B-12 (CYANOCOBALAMIN) 500 MCG tablet Take 500 mcg by mouth daily.     cyclobenzaprine (FLEXERIL) 10 MG tablet Take 1 tablet (10 mg total) by mouth 3 (three) times daily  as needed for muscle spasms. (Patient not taking: Reported on 05/17/2023) 30 tablet 5   ondansetron (ZOFRAN) 4 MG tablet TAKE 1 TABLET BY MOUTH EVERY 8 HOURS AS NEEDED FOR NAUSEA AND VOMITING (Patient not taking: Reported on 05/17/2023) 30 tablet 3   promethazine (PHENERGAN) 12.5 MG tablet Take 1 tablet (12.5 mg total) by mouth daily. As needed (Patient not taking: Reported on 05/17/2023) 30 tablet 3   traMADol (ULTRAM) 50 MG tablet Take 1 tablet (50 mg total) by mouth daily as needed. 30 tablet 5   No current facility-administered medications for this visit.     Objective:  BP 130/82   Pulse 62   Temp (!) 97.5 F (36.4 C)   Ht 5\' 11"  (1.803 m)   Wt 208 lb (94.3 kg)   SpO2 96%   BMI 29.01 kg/m  Gen: NAD, resting comfortably CV: RRR no murmurs rubs or gallops Lungs: CTAB no  crackles, wheeze, rhonchi Ext: no edema, 2+ dp and patient pulses Skin: warm, dry     Assessment and Plan   #Asbestosis class II-follows with Dr. Jeannett Senior Proctor-later referred to [facility] Dr. Tresa Endo #Asthma S: Medication: Symbicort for maintenance added August 2021 in addition to Singulair 10 mg, albuterol as needed- helps some with coughing fits - but doesn't always use -Is 1.4 cm hypodense nodule noted on CT 02/14/2023-being followed by [facility] pulmonology- plan is yearly -still with some nighttime cough- they mentioned sitting up/hot shower A/P: asbestosis stable and gets annual imaging- nodule will be rechecked next visit Asthma possibly with poor control with nighttime coughing vs asbestosis irriation or reflux- continue current medications for now- discussed can use albuterol more but let me know at next visit how often- may need to tweak Symbicort dose/treatment   #History of prostate cancer-robotic prostatectomy with Dr. Laverle Patter in the past.  Continues to follow with urology last visit September 2024. PSA undetectable- annual checkup -For overactive bladder patient is on Vesicare 10 mg- more helpful than 5   #hypertension S: medication: Propanolol 40mg  twice daily, hydrochlorothiazide 25 mg twice daily but also requires potassium 20 mEq weekly , amlodipine 2.5Mg  Home readings #s: usually 115-140 BP Readings from Last 3 Encounters:  05/17/23 130/82  11/17/22 112/70  07/18/22 130/80  A/P: well controlled continue current medications .  -some cramping in legs at times and extra potassium helps- check potassium today with labs- may need higher dose -tried lower dose just once a day hydrochlorothiazide but reports blood pressure elevated- may consider amlodipien 5 mg in future and trying lower dose hydrochlorothiazide potentially  #hyperlipidemia-LDL has been below 70 S: Medication:Atorvastatin 20Mg  daily  Lab Results  Component Value Date   CHOL 123 11/17/2022   HDL 45.40  11/17/2022   LDLCALC 56 11/17/2022   LDLDIRECT 75.0 08/07/2015   TRIG 106.0 11/17/2022   CHOLHDL 3 11/17/2022   A/P: lipids at ideal goal in September- continue current medications   # Anxiety S:Medication: Paxil 20 mg. Still helpful Suicidal ideation:none  A/P: anxiety well controlled - continue current medications    #Chronic back pain-in the past patient was on Percocet but later weaned to Mobic and Valium and gabapentin and tramadol -Currently on Flexeril 10 mg up to 3 times a day as needed, gabapentin 300 mg at bedtime as needed,  tramadol daily as needed -pain can go down into legs at times- if makes it 10-15 minutes will subside A/P: ongoing chronic issues- careful with picking things up and seems to ohelp- continue current medications   #  Migraines S: Prophylaxis: Propranolol for blood pressure control but also likely helps reduce frequency of migraines. -Fioricet uses typically twice a week-lately about the same  A/P: stable- continue current medicines    # GERD S:Medication: Prilosec 40Mg  daily.  Pepcid in evening also helpful - fdgard helpful A/P: reflux reasonably controlled- continue current medications    #Allergies-Astelin helpfulalong with Singulair 10 mg and fexofenodine  Recommended follow up: Return in about 6 months (around 11/17/2023) for physical or sooner if needed.Schedule b4 you leave. Future Appointments  Date Time Provider Department Center  05/20/2024  8:40 AM [facility] ANNUAL WELLNESS VISIT 1 [facility]    Lab/Order associations:   ICD-10-CM   1. Asbestosis (HCC)  J61     2. Migraine with aura and without status migrainosus, not intractable  G43.109     3. Anxiety state  F41.1     4. Hyperlipidemia, unspecified hyperlipidemia type  E78.5     5. Essential hypertension  I10 Comprehensive metabolic panel    6. Mild intermittent asthma without complication  J45.20     7. Aortic atherosclerosis (HCC)  I70.0       Meds ordered this encounter   Medications   traMADol (ULTRAM) 50 MG tablet    Sig: Take 1 tablet (50 mg total) by mouth daily as needed.    Dispense:  30 tablet    Refill:  5    This request is for a new prescription for a controlled substance as required by Federal/State law..    Return precautions advised.  Tana Conch, MD

## 2023-05-17 NOTE — Patient Instructions (Addendum)
 Please stop by lab before you go If you have mychart- we will send your results within 3 business days of [place] receiving them.  If you do not have mychart- we will call you about results within 5 business days of [place] receiving them.  *please also note that you will see labs on mychart as soon as they post. I will later go in and write notes on them- will say "notes from Dr. Durene Cal"   No changes today unless labs lead [place] to make changes- but may need more potassium   Recommended follow up: Return in about 6 months (around 11/17/2023) for physical or sooner if needed.Schedule b4 you leave. Labs next visit

## 2023-06-16 ENCOUNTER — Ambulatory Visit: Payer: Self-pay

## 2023-06-16 ENCOUNTER — Ambulatory Visit (INDEPENDENT_AMBULATORY_CARE_PROVIDER_SITE_OTHER)

## 2023-06-16 ENCOUNTER — Ambulatory Visit
Admission: EM | Admit: 2023-06-16 | Discharge: 2023-06-16 | Disposition: A | Discharge status: Home / Self Care | Attending: Family Medicine | Admitting: Family Medicine

## 2023-06-16 ENCOUNTER — Encounter: Payer: Self-pay | Admitting: Emergency Medicine

## 2023-06-16 DIAGNOSIS — S61432A Puncture wound without foreign body of left hand, initial encounter: Secondary | ICD-10-CM

## 2023-06-16 DIAGNOSIS — M19042 Primary osteoarthritis, left hand: Secondary | ICD-10-CM | POA: Diagnosis not present

## 2023-06-16 DIAGNOSIS — M79642 Pain in left hand: Secondary | ICD-10-CM | POA: Diagnosis not present

## 2023-06-16 DIAGNOSIS — S6992XA Unspecified injury of left wrist, hand and finger(s), initial encounter: Secondary | ICD-10-CM | POA: Diagnosis not present

## 2023-06-16 MED ORDER — MUPIROCIN 2 % EX OINT
1.0000 | TOPICAL_OINTMENT | Freq: Two times a day (BID) | CUTANEOUS | 0 refills | Status: AC
Start: 2023-06-16 — End: ?

## 2023-06-16 MED ORDER — CEPHALEXIN 500 MG PO CAPS: 500.0000 mg | ORAL_CAPSULE | Freq: Two times a day (BID) | ORAL | 0 refills | Status: DC

## 2023-06-16 MED ORDER — CHLORHEXIDINE GLUCONATE 4 % EX SOLN: Freq: Every day | CUTANEOUS | 0 refills | Status: AC | PRN

## 2023-06-16 NOTE — Telephone Encounter (Signed)
  Chief Complaint: puncture wound left hand Symptoms: swollen, "jittery" pain Frequency: yesterday Pertinent Negatives: Patient denies fever Disposition: [] ED /[x] Urgent Care (no appt availability in office) / [] Appointment(In office/virtual)/ []  [place] [facility]/ [] Home Care/ [] Refused Recommended Disposition /[] [place] [facility]/ []  Follow-up with PCP Additional Notes: office closed Copied from CRM [id number]. Topic: Clinical - Red Word Triage >> Jun 16, 2023 10:28 AM Varney Gentleman wrote: [facility] that prompted transfer to Nurse Triage: Had metal spike went through left hand yesterday pain and swollen, and hand feels funny. Reason for Disposition  [1] Puncture wound of finger AND [2] entire finger swollen    Puncture wound of hand  Answer Assessment - Initial Assessment Questions 1. LOCATION: "Where is the puncture located?"      Left hand  2. OBJECT: "What was the object that punctured the skin?"      Metal spike 3. DEPTH: "How deep do you think the puncture goes?"      Went most of the way through 4. ONSET: "When did the injury occur?" (Minutes or hours)     yesterday 5. PAIN: "Is it painful?" If Yes, ask: "How bad is the pain?"  (Scale 1-10; or mild, moderate, severe)     7/10 6. TETANUS: "When was the last tetanus booster?"     Can't remember  Protocols used: Puncture Wound-A-AH

## 2023-06-16 NOTE — Discharge Instructions (Signed)
 Take the full course of antibiotics to make sure the area does not get infected.  Clean the area at least once a day with Hibiclens  solution and apply mupirocin  ointment and a nonstick dressing.  Elevate at rest, ice off-and-on to help with swelling.  Ibuprofen and Tylenol  as needed for pain.  We will let you know if anything comes back abnormal on your x-ray.

## 2023-06-16 NOTE — ED Triage Notes (Signed)
 Hit with a spike in palm of left hand yesterday. States area bleed yesterday.  States pain radiates up arm.  States last tetanus has been within 10 years.

## 2023-06-16 NOTE — ED Provider Notes (Signed)
 [facility]    CSN: [id number] Arrival date & time: 06/16/23  1041      History   Chief Complaint No chief complaint on file.   HPI Mark [name] is a 73 y.o. male.   Patient presenting today with 1 day history of left hand pain after a spike punctured his palm yesterday.  He states the area bled yesterday but did not have any bleeding or drainage today.  Denies loss of range of motion but does note tingling, pain, swelling.  So far not trying anything over-the-counter for symptoms.  Last tetanus shot per chart review was June 2020.    Past Medical History:  Diagnosis Date   Allergy    seasonal   Anxiety state, unspecified 09/01/2009   ASTHMA 03/12/2007   Asthma    Cancer (HCC) 2008   prostate cancer   COVID-19    Gastroparesis 05/01/2009   GERD 04/02/2009   Headache(784.0) 03/07/2007   HIATAL HERNIA 04/28/2009   Hiatal hernia    HYPERLIPIDEMIA 06/11/2007   HYPERTENSION 03/07/2007   Pneumonia    hx of   Polyp of colon, hyperplastic    PROSTATE CANCER, HX OF 03/07/2007   Suspected exposure to asbestos    have asbestosis testing every year   TRANSAMINASES, SERUM, ELEVATED 04/28/2009   Tubular adenoma of colon    Ulcer    as teenager    Patient Active Problem List   Diagnosis Date Noted   Aortic atherosclerosis (HCC) 03/25/2021   History of adenomatous polyp of colon 09/08/2017   Asbestosis (HCC) 02/05/2016   Allergic rhinitis 11/22/2013   Back pain 10/09/2013   Overweight 01/10/2012   Anxiety state 09/01/2009   Gastroparesis 05/01/2009   HIATAL HERNIA 04/28/2009   GERD 04/02/2009   Hyperlipidemia 06/11/2007   Asthma 03/12/2007   Essential hypertension 03/07/2007   Migraine headache 03/07/2007   PROSTATE CANCER, HX OF 03/07/2007    Past Surgical History:  Procedure Laterality Date   APPENDECTOMY     BACK SURGERY  05/2011   COLONOSCOPY     LUMBAR LAMINECTOMY/DECOMPRESSION MICRODISCECTOMY N/A 10/09/2013   Procedure: L4-5  DECOMPRESSION/FARAMINOTOMY REVISION ;  Surgeon: Mort Ards, MD;  Location: [facility] OR;  Service: Orthopedics;  Laterality: N/A;   PROSTATECTOMY  07/10/06   ROTATOR CUFF REPAIR     9/09 on right   TRANSFORAMINAL LUMBAR INTERBODY FUSION (TLIF) WITH PEDICLE SCREW FIXATION 1 LEVEL N/A 10/09/2013   Procedure: TLIF L5-S1;  Surgeon: Mort Ards, MD;  Location: [facility] OR;  Service: Orthopedics;  Laterality: N/A;       Home Medications    Prior to Admission medications   Medication Sig Start Date End Date Taking? Authorizing Provider  cephALEXin  (KEFLEX ) 500 MG capsule Take 1 capsule (500 mg total) by mouth 2 (two) times daily. 06/16/23  Yes Corbin Dess, PA-C  chlorhexidine  (HIBICLENS ) 4 % external liquid Apply topically daily as needed. 06/16/23  Yes Corbin Dess, PA-C  mupirocin  ointment (BACTROBAN ) 2 % Apply 1 Application topically 2 (two) times daily. 06/16/23  Yes Corbin Dess, PA-C  albuterol  (PROAIR  HFA) 108 [phone] Base) MCG/ACT inhaler INHALE 2 PUFFS EVERY 6 HOURS AS NEEDED FOR WHEEZING OR SHORTNESS OF BREATH 11/17/22   Almira Jaeger, MD  amLODipine  (NORVASC ) 2.5 MG tablet TAKE 1 TABLET BY MOUTH EVERY DAY 02/10/23   Almira Jaeger, MD  atorvastatin  (LIPITOR) 20 MG tablet TAKE 1 TABLET (20 MG TOTAL) BY MOUTH DAILY. PT IS TAKING AT BEDTIME 12/08/21  Almira Jaeger, MD  Azelastine  HCl 137 MCG/SPRAY SOLN PLACE 2 SPRAYS INTO EACH NOSTRIL DAILY. 02/21/23   Almira Jaeger, MD  butalbital -acetaminophen -caffeine  (FIORICET) 50-325-40 MG tablet TAKE 1 TABLET BY MOUTH DAILY AS NEEDED- MAX 2 PER WEEK. 1 REFILL PER MONTH 02/14/23   Almira Jaeger, MD  cyclobenzaprine  (FLEXERIL ) 10 MG tablet Take 1 tablet (10 mg total) by mouth 3 (three) times daily as needed for muscle spasms. Patient not taking: Reported on 05/17/2023 02/21/23   Almira Jaeger, MD  famotidine  (PEPCID ) 20 MG tablet Take 1 tablet (20 mg total) by mouth at bedtime. 07/18/22   Nandigam, Kavitha V, MD   fexofenadine  (ALLEGRA ) 180 MG tablet Patient is taking 2 times daily 11/21/18   Almira Jaeger, MD  fluticasone  (FLONASE ) 50 MCG/ACT nasal spray USE ONE SPRAY IN EACH NOSTRIL AS DIRECTED ONCE DAILY 02/23/22   Almira Jaeger, MD  gabapentin  (NEURONTIN ) 300 MG capsule Take 3 capsules (900 mg total) by mouth at bedtime. 11/17/22   Almira Jaeger, MD  hydrochlorothiazide  (HYDRODIURIL ) 25 MG tablet TAKE 1 TABLET BY MOUTH TWICE A DAY 03/14/22   Almira Jaeger, MD  KLOR-CON  M20 20 MEQ tablet TAKE 1 TABLET (20 MEQ TOTAL) BY MOUTH ONCE A WEEK. 12/02/22   Almira Jaeger, MD  montelukast  (SINGULAIR ) 10 MG tablet TAKE 1 TABLET BY MOUTH EVERYDAY AT BEDTIME 02/10/23   Almira Jaeger, MD  omeprazole  (PRILOSEC) 40 MG capsule ONE CAPSULE DAILY 30 MINUTES BEFORE BREAKFAST - 07/18/22   Nandigam, Kavitha V, MD  ondansetron  (ZOFRAN ) 4 MG tablet TAKE 1 TABLET BY MOUTH EVERY 8 HOURS AS NEEDED FOR NAUSEA AND VOMITING Patient not taking: Reported on 05/17/2023 07/18/22   Nandigam, Kavitha V, MD  PARoxetine  (PAXIL ) 20 MG tablet TAKE 1 TABLET BY MOUTH EVERYDAY AT BEDTIME 05/11/23   Almira Jaeger, MD  promethazine  (PHENERGAN ) 12.5 MG tablet Take 1 tablet (12.5 mg total) by mouth daily. As needed Patient not taking: Reported on 05/17/2023 07/18/22   Nandigam, Kavitha V, MD  propranolol  (INDERAL ) 40 MG tablet TAKE 1 TABLET BY MOUTH TWICE A DAY 02/10/23   Almira Jaeger, MD  SYMBICORT  160-4.5 MCG/ACT inhaler TAKE 2 PUFFS BY MOUTH TWICE A DAY 05/16/22   Almira Jaeger, MD  traMADol  (ULTRAM ) 50 MG tablet Take 1 tablet (50 mg total) by mouth daily as needed. 05/17/23   Almira Jaeger, MD  VESICARE 5 MG tablet Take 1 tablet by mouth daily. 06/01/16   [provider]  vitamin B-12 (CYANOCOBALAMIN ) 500 MCG tablet Take 500 mcg by mouth daily.    [provider]    Family History Family History  Problem Relation Age of Onset   Pancreatic cancer Mother 104   Hypertension Father    Heart attack  Father        66   Multiple sclerosis Sister    Heart disease Sister    Heart attack Sister    Hypertension Brother        x 2   Hypertension Brother    Kidney disease Brother    Hypertension Brother    Heart attack Maternal Grandmother    Heart attack Paternal Grandmother    Heart attack Paternal Grandfather    Liver cancer Daughter    Hypertension Son    Colon cancer Neg Hx    Rectal cancer Neg Hx    Stomach cancer Neg Hx    Esophageal cancer Neg Hx     Social  History Social History   Tobacco Use   Smoking status: Former    Current packs/day: 0.00    Average packs/day: 0.5 packs/day for 2.0 years (1.0 ttl pk-yrs)    Types: Cigarettes    Start date: 04/08/1981    Quit date: 04/09/1983    Years since quitting: 40.2   Smokeless tobacco: Never  Vaping Use   Vaping status: Never Used  Substance Use Topics   Alcohol use: No    Alcohol/week: 0.0 standard drinks of alcohol   Drug use: No     Allergies   Propoxyphene hcl   Review of Systems Review of Systems PER HPI  Physical Exam Triage Vital Signs ED Triage Vitals  Encounter Vitals Group     BP 06/16/23 1047 (!) 149/81     Systolic BP Percentile --      Diastolic BP Percentile --      Pulse Rate 06/16/23 1047 76     Resp 06/16/23 1047 18     Temp 06/16/23 1047 98 F (36.7 C)     Temp Source 06/16/23 1047 Oral     SpO2 06/16/23 1047 95 %     Weight --      Height --      Head Circumference --      Peak Flow --      Pain Score 06/16/23 1050 7     Pain Loc --      Pain Education --      Exclude from Growth Chart --    No data found.  Updated Vital Signs BP (!) 149/81 (BP Location: Right Arm)   Pulse 76   Temp 98 F (36.7 C) (Oral)   Resp 18   SpO2 95%   Visual Acuity Right Eye Distance:   Left Eye Distance:   Bilateral Distance:    Right Eye Near:   Left Eye Near:    Bilateral Near:     Physical Exam Vitals and nursing note reviewed.  Constitutional:      Appearance: Normal appearance.   HENT:     Head: Atraumatic.  Eyes:     Extraocular Movements: Extraocular movements intact.     Conjunctiva/sclera: Conjunctivae normal.  Cardiovascular:     Rate and Rhythm: Normal rate.  Pulmonary:     Effort: Pulmonary effort is normal.  Musculoskeletal:        General: Swelling, tenderness and signs of injury present. No deformity. Normal range of motion.     Cervical back: Normal range of motion and neck supple.  Skin:    General: Skin is warm.     Comments: Small puncture wound site to the palm of left hand, wound edges well-approximated at rest, no foreign body appreciable on exam, no active bleeding or drainage.  Neurological:     General: No focal deficit present.     Mental Status: He is oriented to person, place, and time.     Comments: Left upper extremity neurovascularly intact  Psychiatric:        Mood and Affect: Mood normal.        Thought Content: Thought content normal.        Judgment: Judgment normal.      UC Treatments / Results  Labs (all labs ordered are listed, but only abnormal results are displayed) Labs Reviewed - No data to display  EKG   Radiology DG Hand Complete Left Result Date: 06/16/2023 CLINICAL DATA:  Left palm injury. EXAM: LEFT HAND - COMPLETE 3+  VIEW COMPARISON:  None Available. FINDINGS: No acute fracture or dislocation. Small 3 mm focus of mineralization adjacent to the ulnar aspect of the head of fourth proximal phalanx, may relate to prior trauma. Severe joint space narrowing and subchondral sclerosis at the first and second CMC joints. Moderate joint space narrowing and marginal osteophytosis of the first and second MCP joints. Radiocarpal and diffuse interphalangeal joint space narrowing. No radiopaque foreign body identified. IMPRESSION: 1. No acute osseous abnormality. 2. Moderate-to-severe osteoarthritis of the hand, as above. Electronically Signed   By: Mannie Seek M.D.   On: 06/16/2023 11:49    Procedures Procedures  (including critical care time)  Medications Ordered in UC Medications - No data to display  Initial Impression / Assessment and Plan / UC Course  I have reviewed the triage vital signs and the nursing notes.  Pertinent labs & imaging results that were available during my care of the patient were reviewed by me and considered in my medical decision making (see chart for details).     X-ray of the left hand negative for foreign body or acute bony abnormality, wound dressed and clean today.  Discussed Keflex , Hibiclens , Bactroban  and good home wound care.  Return precautions reviewed.  Tetanus up-to-date.  Final Clinical Impressions(s) / UC Diagnoses   Final diagnoses:  Left hand pain  Puncture wound of left hand without foreign body, initial encounter     Discharge Instructions      Take the full course of antibiotics to make sure the area does not get infected.  Clean the area at least once a day with Hibiclens  solution and apply mupirocin  ointment and a nonstick dressing.  Elevate at rest, ice off-and-on to help with swelling.  Ibuprofen and Tylenol  as needed for pain.  We will let you know if anything comes back abnormal on your x-ray.    ED Prescriptions     Medication Sig Dispense Auth. Provider   cephALEXin  (KEFLEX ) 500 MG capsule Take 1 capsule (500 mg total) by mouth 2 (two) times daily. 14 capsule Corbin Dess, PA-C   chlorhexidine  (HIBICLENS ) 4 % external liquid Apply topically daily as needed. 236 mL Corbin Dess, PA-C   mupirocin  ointment (BACTROBAN ) 2 % Apply 1 Application topically 2 (two) times daily. 60 g Corbin Dess, [place]      PDMP not reviewed this encounter.   Corbin Dess, [place] 06/16/23 1719

## 2023-08-02 ENCOUNTER — Other Ambulatory Visit: Payer: Self-pay | Admitting: Family Medicine

## 2023-08-02 ENCOUNTER — Other Ambulatory Visit: Payer: Self-pay | Admitting: Gastroenterology

## 2023-08-13 DIAGNOSIS — H524 Presbyopia: Secondary | ICD-10-CM | POA: Diagnosis not present

## 2023-08-13 DIAGNOSIS — H2513 Age-related nuclear cataract, bilateral: Secondary | ICD-10-CM | POA: Diagnosis not present

## 2023-08-17 ENCOUNTER — Other Ambulatory Visit: Payer: Self-pay | Admitting: Gastroenterology

## 2023-10-11 ENCOUNTER — Other Ambulatory Visit: Payer: Self-pay | Admitting: Gastroenterology

## 2023-10-17 ENCOUNTER — Other Ambulatory Visit: Payer: Self-pay | Admitting: Family Medicine

## 2023-10-17 ENCOUNTER — Telehealth: Payer: Self-pay | Admitting: Family Medicine

## 2023-10-17 MED ORDER — BUDESONIDE-FORMOTEROL FUMARATE 160-4.5 MCG/ACT IN AERO: 2.0000 | INHALATION_SPRAY | Freq: Two times a day (BID) | RESPIRATORY_TRACT | 3 refills | Status: AC

## 2023-10-17 NOTE — Telephone Encounter (Unsigned)
 Copied from CRM [id number]. Topic: Clinical - Medication Refill >> Oct 17, 2023 11:20 AM Franky GRADE wrote: Medication: SYMBICORT  160-4.5 MCG/ACT inhaler [[id number]]  Has the patient contacted their pharmacy? Yes, they sent a request last week and have not gotten a response, asked patient to contact the provider.  (Agent: If no, request that the patient contact the pharmacy for the refill. If patient does not wish to contact the pharmacy document the reason why and proceed with request.) (Agent: If yes, when and what did the pharmacy advise?)  This is the patient's preferred pharmacy:  [id number] - [place], [employer] - [address] AT [facility] [address] [place] [address] Phone: [phone] Fax: [phone]  Is this the correct pharmacy for this prescription? Yes If no, delete pharmacy and type the correct one.   Has the prescription been filled recently? No  Is the patient out of the medication? Yes  Has the patient been seen for an appointment in the last year OR does the patient have an upcoming appointment? Yes  Can we respond through MyChart? Yes  Agent: Please be advised that Rx refills may take up to 3 business days. We ask that you follow-up with your pharmacy.

## 2023-11-08 DIAGNOSIS — Z8546 Personal history of malignant neoplasm of prostate: Secondary | ICD-10-CM | POA: Diagnosis not present

## 2023-11-28 ENCOUNTER — Encounter: Admitting: Family Medicine

## 2023-12-01 ENCOUNTER — Ambulatory Visit (INDEPENDENT_AMBULATORY_CARE_PROVIDER_SITE_OTHER): Admitting: Family Medicine

## 2023-12-01 VITALS — BP 120/72 | HR 68 | Temp 97.5°F | Ht 71.0 in | Wt 202.8 lb

## 2023-12-01 DIAGNOSIS — Z23 Encounter for immunization: Secondary | ICD-10-CM | POA: Diagnosis not present

## 2023-12-01 DIAGNOSIS — E663 Overweight: Secondary | ICD-10-CM

## 2023-12-01 DIAGNOSIS — I10 Essential (primary) hypertension: Secondary | ICD-10-CM | POA: Diagnosis not present

## 2023-12-01 DIAGNOSIS — Z0000 Encounter for general adult medical examination without abnormal findings: Secondary | ICD-10-CM

## 2023-12-01 DIAGNOSIS — E785 Hyperlipidemia, unspecified: Secondary | ICD-10-CM

## 2023-12-01 DIAGNOSIS — Z131 Encounter for screening for diabetes mellitus: Secondary | ICD-10-CM

## 2023-12-01 LAB — COMPREHENSIVE METABOLIC PANEL WITH GFR
ALT: 22 U/L (ref 0–53)
AST: 22 U/L (ref 0–37)
Albumin: 4.1 g/dL (ref 3.5–5.2)
Alkaline Phosphatase: 89 U/L (ref 39–117)
BUN: 8 mg/dL (ref 6–23)
CO2: 31 meq/L (ref 19–32)
Calcium: 9.4 mg/dL (ref 8.4–10.5)
Chloride: 99 meq/L (ref 96–112)
Creatinine, Ser: 0.8 mg/dL (ref 0.40–1.50)
GFR: 88.6 mL/min (ref >60.00)
Glucose, Bld: 93 mg/dL (ref 70–99)
Potassium: 3.8 meq/L (ref 3.5–5.1)
Sodium: 139 meq/L (ref 135–145)
Total Bilirubin: 0.9 mg/dL (ref 0.2–1.2)
Total Protein: 6.5 g/dL (ref 6.0–8.3)

## 2023-12-01 LAB — CBC WITH DIFFERENTIAL/PLATELET
Basophils Absolute: 0 K/uL (ref 0.0–0.1)
Basophils Relative: 0.9 % (ref 0.0–3.0)
Eosinophils Absolute: 0.1 K/uL (ref 0.0–0.7)
Eosinophils Relative: 2.1 % (ref 0.0–5.0)
HCT: 46.3 % (ref 39.0–52.0)
Hemoglobin: 15.8 g/dL (ref 13.0–17.0)
Lymphocytes Relative: 24.7 % (ref 12.0–46.0)
Lymphs Abs: 1.2 K/uL (ref 0.7–4.0)
MCHC: 34.1 g/dL (ref 30.0–36.0)
MCV: 93.4 fl (ref 78.0–100.0)
Monocytes Absolute: 0.6 K/uL (ref 0.1–1.0)
Monocytes Relative: 12.6 % — ABNORMAL HIGH (ref 3.0–12.0)
Neutro Abs: 2.8 K/uL (ref 1.4–7.7)
Neutrophils Relative %: 59.7 % (ref 43.0–77.0)
Platelets: 288 K/uL (ref 150.0–400.0)
RBC: 4.96 Mil/uL (ref 4.22–5.81)
RDW: 12.6 % (ref 11.5–15.5)
WBC: 4.7 K/uL (ref 4.0–10.5)

## 2023-12-01 LAB — LIPID PANEL
Cholesterol: 128 mg/dL (ref 0–200)
HDL: 40.8 mg/dL (ref >39.00)
LDL Cholesterol: 66 mg/dL (ref 0–99)
NonHDL: 86.91
Total CHOL/HDL Ratio: 3
Triglycerides: 103 mg/dL (ref 0.0–149.0)
VLDL: 20.6 mg/dL (ref 0.0–40.0)

## 2023-12-01 LAB — HEMOGLOBIN A1C: Hgb A1c MFr Bld: 5.2 % (ref 4.6–6.5)

## 2023-12-01 MED ORDER — BUTALBITAL-APAP-CAFFEINE 50-325-40 MG PO TABS: ORAL_TABLET | ORAL | 5 refills | Status: AC

## 2023-12-01 MED ORDER — TRAMADOL HCL 50 MG PO TABS: 50.0000 mg | ORAL_TABLET | Freq: Every day | ORAL | 5 refills | Status: AC | PRN

## 2023-12-01 NOTE — Patient Instructions (Addendum)
 Health Maintenance Due  Topic Date Due   Colonoscopy  12/18/2023  [facility] contact Please call to schedule visit and/or procedure IF you do not hear within a week Address: [address] [place], [place], [place] [address] Phone: [phone]   Reports some issues at night where wife sees him struggling to breathe- offered sleep apnea testing but he wanted to hold off for now and possibly bring wife to next visit to discuss. He reports there is no wheezing  Please stop by lab before you go If you have mychart- we will send your results within 3 business days of us  receiving them.  If you do not have mychart- we will call you about results within 5 business days of us  receiving them.  *please also note that you will see labs on mychart as soon as they post. I will later go in and write notes on them- will say notes from Dr. Katrinka   No changes today unless labs lead us  to make changes  Recommended follow up: Return in about 6 months (around 05/31/2024) for followup or sooner if needed.Schedule b4 you leave.

## 2023-12-01 NOTE — Progress Notes (Signed)
 Phone: [phone]   Subjective:  Patient presents today for their annual physical. Chief complaint-noted.   See problem oriented charting- ROS- full  review of systems was completed and negative  except for topics noted under acute/chronic concerns  The following were reviewed and entered/updated in epic: Past Medical History:  Diagnosis Date   Allergy    seasonal   Anxiety state, unspecified 09/01/2009   ASTHMA 03/12/2007   Asthma    Cancer (HCC) 2008   prostate cancer   COVID-19    Gastroparesis 05/01/2009   GERD 04/02/2009   Headache(784.0) 03/07/2007   HIATAL HERNIA 04/28/2009   Hiatal hernia    HYPERLIPIDEMIA 06/11/2007   HYPERTENSION 03/07/2007   Pneumonia    hx of   Polyp of colon, hyperplastic    PROSTATE CANCER, HX OF 03/07/2007   Suspected exposure to asbestos    have asbestosis testing every year   TRANSAMINASES, SERUM, ELEVATED 04/28/2009   Tubular adenoma of colon    Ulcer    as teenager   Patient Active Problem List   Diagnosis Date Noted   Asbestosis (HCC) 02/05/2016    Priority: High   Back pain 10/09/2013    Priority: High   Anxiety state 09/01/2009    Priority: High   Migraine headache 03/07/2007    Priority: High   Aortic atherosclerosis 03/25/2021    Priority: Medium    Gastroparesis 05/01/2009    Priority: Medium    GERD 04/02/2009    Priority: Medium    Hyperlipidemia 06/11/2007    Priority: Medium    Asthma 03/12/2007    Priority: Medium    Essential hypertension 03/07/2007    Priority: Medium    PROSTATE CANCER, HX OF 03/07/2007    Priority: Medium    Allergic rhinitis 11/22/2013    Priority: Low   Overweight 01/10/2012    Priority: Low   HIATAL HERNIA 04/28/2009    Priority: Low   History of adenomatous polyp of colon 09/08/2017   Past Surgical History:  Procedure Laterality Date   APPENDECTOMY     BACK SURGERY  05/2011   COLONOSCOPY     LUMBAR LAMINECTOMY/DECOMPRESSION MICRODISCECTOMY N/A 10/09/2013   Procedure:  L4-5 DECOMPRESSION/FARAMINOTOMY REVISION ;  Surgeon: Donaciano Sprang, MD;  Location: [facility] OR;  Service: Orthopedics;  Laterality: N/A;   PROSTATECTOMY  07/10/06   ROTATOR CUFF REPAIR     9/09 on right   TRANSFORAMINAL LUMBAR INTERBODY FUSION (TLIF) WITH PEDICLE SCREW FIXATION 1 LEVEL N/A 10/09/2013   Procedure: TLIF L5-S1;  Surgeon: Donaciano Sprang, MD;  Location: [facility] OR;  Service: Orthopedics;  Laterality: N/A;    Family History  Problem Relation Age of Onset   Pancreatic cancer Mother 72   Hypertension Father    Heart attack Father        57   Multiple sclerosis Sister    Heart disease Sister    Heart attack Sister    Hypertension Brother        x 2   Hypertension Brother    Kidney disease Brother    Hypertension Brother    Heart attack Maternal Grandmother    Heart attack Paternal Grandmother    Heart attack Paternal Grandfather    Liver cancer Daughter    Hypertension Son    Colon cancer Neg Hx    Rectal cancer Neg Hx    Stomach cancer Neg Hx    Esophageal cancer Neg Hx     Medications- reviewed and updated Current Outpatient Medications  Medication  Sig Dispense Refill   albuterol  (PROAIR  HFA) 108 (90 Base) MCG/ACT inhaler INHALE 2 PUFFS EVERY 6 HOURS AS NEEDED FOR WHEEZING OR SHORTNESS OF BREATH 76.5 g 3   amLODipine  (NORVASC ) 2.5 MG tablet TAKE 1 TABLET BY MOUTH EVERY DAY 90 tablet 1   atorvastatin  (LIPITOR) 20 MG tablet TAKE 1 TABLET (20 MG TOTAL) BY MOUTH DAILY. PT IS TAKING AT BEDTIME 90 tablet 3   Azelastine  HCl 137 MCG/SPRAY SOLN PLACE 2 SPRAYS INTO EACH NOSTRIL DAILY. 90 mL 3   budesonide -formoterol  (SYMBICORT ) 160-4.5 MCG/ACT inhaler Inhale 2 puffs into the lungs in the morning and at bedtime. 10.2 each 3   butalbital -acetaminophen -caffeine  (FIORICET) 50-325-40 MG tablet TAKE 1 TABLET BY MOUTH DAILY AS NEEDED- MAX 2 PER WEEK. 1 REFILL PER MONTH 10 tablet 5   chlorhexidine  (HIBICLENS ) 4 % external liquid Apply topically daily as needed. 236 mL 0   famotidine  (PEPCID ) 20  MG tablet TAKE 1 TABLET BY MOUTH EVERYDAY AT BEDTIME 90 tablet 3   fexofenadine  (ALLEGRA ) 180 MG tablet Patient is taking 2 times daily 90 tablet 1   fluticasone  (FLONASE ) 50 MCG/ACT nasal spray USE ONE SPRAY IN EACH NOSTRIL AS DIRECTED ONCE DAILY 48 mL 3   gabapentin  (NEURONTIN ) 300 MG capsule Take 3 capsules (900 mg total) by mouth at bedtime. 270 capsule 3   hydrochlorothiazide  (HYDRODIURIL ) 25 MG tablet TAKE 1 TABLET BY MOUTH TWICE A DAY 180 tablet 3   KLOR-CON  M20 20 MEQ tablet TAKE 1 TABLET (20 MEQ TOTAL) BY MOUTH ONCE A WEEK. 13 tablet 3   montelukast  (SINGULAIR ) 10 MG tablet TAKE 1 TABLET BY MOUTH EVERYDAY AT BEDTIME 90 tablet 3   mupirocin  ointment (BACTROBAN ) 2 % Apply 1 Application topically 2 (two) times daily. 60 g 0   omeprazole  (PRILOSEC) 40 MG capsule TAKE ONE CAPSULE DAILY 30 MINUTES BEFORE BREAKFAST 90 capsule 3   ondansetron  (ZOFRAN ) 4 MG tablet TAKE 1 TABLET BY MOUTH EVERY 8 HOURS AS NEEDED FOR NAUSEA AND VOMITING 30 tablet 3   PARoxetine  (PAXIL ) 20 MG tablet TAKE 1 TABLET BY MOUTH EVERYDAY AT BEDTIME 90 tablet 1   propranolol  (INDERAL ) 40 MG tablet TAKE 1 TABLET BY MOUTH TWICE A DAY 180 tablet 3   traMADol  (ULTRAM ) 50 MG tablet Take 1 tablet (50 mg total) by mouth daily as needed. 30 tablet 5   VESICARE 5 MG tablet Take 1 tablet by mouth daily.     vitamin B-12 (CYANOCOBALAMIN ) 500 MCG tablet Take 500 mcg by mouth daily.     cyclobenzaprine  (FLEXERIL ) 10 MG tablet Take 1 tablet (10 mg total) by mouth 3 (three) times daily as needed for muscle spasms. (Patient not taking: Reported on 12/01/2023) 30 tablet 5   promethazine  (PHENERGAN ) 12.5 MG tablet Take 1 tablet (12.5 mg total) by mouth daily. As needed (Patient not taking: Reported on 12/01/2023) 30 tablet 3   No current facility-administered medications for this visit.    Allergies-reviewed and updated Allergies  Allergen Reactions   Propoxyphene Hcl Nausea And Vomiting         Social History   Social History  Narrative   Married 1974. Wife-Helen. 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    Lost daughter Ellouise at age 77 in 2023- left behind 1 child that now lives with him      Retired from  [employer]      Hobbies: hunting, boat (off for 1 year in 2015)   Objective  Objective:  BP 120/72 (BP Location: Left Arm, Patient Position: Sitting, Cuff Size: Normal)   Pulse 68   Temp (!) 97.5 F (36.4 C) (Temporal)   Ht 5' 11 (1.803 m)   Wt 202 lb 12.8 oz (92 kg)   SpO2 96%   BMI 28.28 kg/m  Gen: NAD, resting comfortably HEENT: Mucous membranes are moist. Oropharynx normal Neck: no thyromegaly CV: RRR no murmurs rubs or gallops Lungs: CTAB no crackles, wheeze, rhonchi Abdomen: soft/nontender/nondistended/normal bowel sounds. No rebound or guarding.  Ext: no edema Skin: warm, dry Neuro: grossly normal, moves all extremities, PERRLA    Assessment and Plan  73 y.o. male presenting for annual physical.  Health Maintenance counseling: 1. Anticipatory guidance: Patient counseled regarding regular dental exams -q6 months, eye exams -yearly,  avoiding smoking and second hand smoke , limiting alcohol to 2 beverages per day - rare wine with a meal out, no illicit drugs .   2. Risk factor reduction:  Advised patient of need for regular exercise and diet rich and fruits and vegetables to reduce risk of heart attack and stroke.  Exercise- doing treadmill most days up to an hour.  Diet/weight management-stable weight from last year.  Wt Readings from Last 3 Encounters:  12/01/23 202 lb 12.8 oz (92 kg)  05/17/23 208 lb (94.3 kg)  05/16/23 203 lb (92.1 kg)  3. Immunizations/screenings/ancillary studies- flu shot today, holding off on shingles, holding off on covid Immunization History  Administered Date(s) Administered   Fluad Quad(high Dose 65+)  10/22/2018, 01/14/2020, 11/12/2021   Fluad Trivalent(High Dose 65+) 11/17/2022   H1N1 03/21/2008   INFLUENZA, HIGH DOSE SEASONAL PF 12/07/2016, 12/20/2017, 12/03/2020, 12/01/2023   Influenza Split 12/31/2010, 11/22/2011, 11/28/2012   Influenza Whole 12/12/2007, 12/11/2008   Influenza,inj,Quad PF,6+ Mos 12/12/2014, 12/31/2015   PFIZER(Purple Top)SARS-COV-2 Vaccination 03/23/2019, 04/14/2019, 11/28/2019   Pfizer Covid-19 Vaccine Bivalent Booster 15yrs & up 12/03/2020   Pneumococcal Conjugate-13 12/07/2016   Pneumococcal Polysaccharide-23 12/20/2017   Td 03/01/1995, 06/17/2008   Tdap 06/11/2007, 08/27/2018   Zoster, Live 01/10/2012   4. Prostate cancer screening- history of prostate cancer status post robotic prostatectomy- still sees Dr. Renda who monitors his PSA- seen recently- still undetectable  PSA  5. Colon cancer screening - 12/17/2020 colonoscopy - due for repeat later this month- gave him # to call 6. Skin cancer screening- lower risk due to melanin content. advised regular sunscreen use. Denies worrisome, changing, or new skin lesions.  7. Smoking associated screening (lung cancer screening, AAA screen 65-75, UA)- former smoker- quit in 1980s and under 2 pack years- gets UA with urology. No aneurysm on 03/24/21 CT abdomen 8. STD screening - only active with wife. Trimex helpful  Status of chronic or acute concerns   #Asbestosis class II-follows with Dr. Garnette Proctor-later referred to [facility] Dr. Burnard Cecil S: Medication: Symbicort  for maintenance added August 2021 in addition to Singulair  10 mg, albuterol  as needed  -Is 1.4 cm hypodense nodule noted on CT 02/14/2023-being followed by [facility] pulmonology - some nighttime cough.  A/P: overall stable- continue current medications - keep follow up with [facility] for nodule on yearly checks  # overactive bladder patient is on Vesicare 10 mg through urology   #hypertension S: medication: Propanolol 40mg  twice daily,  hydrochlorothiazide  25 mg twice daily but also requires potassium 20 mEq weekly , amlodipine  2.5Mg  A/P: well controlled  continue current medications   #hyperlipidemia-LDL has been below 70 S: Medication:Atorvastatin  20Mg  daily  Lab Results  Component Value Date   CHOL 123 11/17/2022   HDL 45.40 11/17/2022   LDLCALC 56 11/17/2022   LDLDIRECT 75.0 08/07/2015   TRIG 106.0 11/17/2022   CHOLHDL 3 11/17/2022   A/P: well controlled last year- update today  # Anxiety S:Medication: Paxil  20 mg A/P: reasonable control- continue current medications    #Chronic back pain-in the past patient was on Percocet but later weaned to Mobic and Valium  and gabapentin  and tramadol - later off valium  -continue  gabapentin  300 mg at bedtime, tramadol  daily as needed  #Migraines S: Prophylaxis: Propranolol  for blood pressure control but also likely helps reduce frequency of migraines. -Fioricet uses typically twice a week still  A/P: doing well continue current medications    # GERD S:Medication: Prilosec 40Mg  daily.  Rare poor control intake second dose  A/P: doing well- continue current medications    #Allergies-Astelin  helpful along with Singulair  10 mg  #Reports some issues at night where wife sees him struggling to breathe- offered sleep apnea testing but he wanted to hold off for now and possibly bring wife to next visit to discuss. He reports there is no wheezing  Recommended follow up: Return in about 6 months (around 05/31/2024) for followup or sooner if needed.Schedule b4 you leave. Future Appointments  Date Time Provider Department Center  05/20/2024  8:40 AM [facility] ANNUAL WELLNESS VISIT 1 [facility]   Lab/Order associations: fasting   ICD-10-CM   1. Preventative health care  Z00.00     2. Immunization due  Z23 Flu vaccine HIGH DOSE PF(Fluzone Trivalent)    3. Screening for diabetes mellitus  Z13.1     4. Overweight  E66.3     5. Hyperlipidemia, unspecified hyperlipidemia  type  E78.5     6. Essential hypertension  I10       No orders of the defined types were placed in this encounter.   Return precautions advised.  Garnette Lukes, MD

## 2023-12-04 ENCOUNTER — Ambulatory Visit: Payer: Self-pay | Admitting: Family Medicine

## 2024-01-09 ENCOUNTER — Other Ambulatory Visit: Payer: Self-pay | Admitting: Family Medicine

## 2024-02-06 ENCOUNTER — Other Ambulatory Visit: Payer: Self-pay | Admitting: Family Medicine

## 2024-03-13 ENCOUNTER — Encounter: Payer: Self-pay | Admitting: Gastroenterology

## 2024-04-02 ENCOUNTER — Encounter: Payer: Self-pay | Admitting: Gastroenterology

## 2024-04-02 ENCOUNTER — Ambulatory Visit

## 2024-04-02 VITALS — Ht 71.0 in | Wt 200.0 lb

## 2024-04-02 DIAGNOSIS — Z860100 Personal history of colon polyps, unspecified: Secondary | ICD-10-CM

## 2024-04-02 MED ORDER — PLENVU 140 G PO SOLR: 1.0000 | Freq: Once | ORAL | 0 refills | Status: AC

## 2024-04-16 ENCOUNTER — Encounter: Admitting: Gastroenterology

## 2024-05-20 ENCOUNTER — Ambulatory Visit

## 2024-05-22 ENCOUNTER — Ambulatory Visit

## 2024-06-03 ENCOUNTER — Ambulatory Visit: Admitting: Family Medicine
